# Patient Record
Sex: Female | Born: 1940 | Race: Asian | Hispanic: No | Marital: Married | Smoking: Former smoker
Health system: Southern US, Community
[De-identification: ages and names within clinical notes are randomized; demographics above are authoritative.]

## PROBLEM LIST (undated history)

## (undated) DIAGNOSIS — M1990 Unspecified osteoarthritis, unspecified site: Secondary | ICD-10-CM

## (undated) DIAGNOSIS — G4730 Sleep apnea, unspecified: Secondary | ICD-10-CM

## (undated) DIAGNOSIS — E7800 Pure hypercholesterolemia, unspecified: Secondary | ICD-10-CM

## (undated) DIAGNOSIS — E119 Type 2 diabetes mellitus without complications: Secondary | ICD-10-CM

## (undated) DIAGNOSIS — M8580 Other specified disorders of bone density and structure, unspecified site: Secondary | ICD-10-CM

## (undated) DIAGNOSIS — C50919 Malignant neoplasm of unspecified site of unspecified female breast: Secondary | ICD-10-CM

## (undated) DIAGNOSIS — I10 Essential (primary) hypertension: Secondary | ICD-10-CM

## (undated) DIAGNOSIS — F419 Anxiety disorder, unspecified: Secondary | ICD-10-CM

## (undated) DIAGNOSIS — D0510 Intraductal carcinoma in situ of unspecified breast: Secondary | ICD-10-CM

## (undated) HISTORY — PX: COMBINED LAPAROSCOPY W/ HYSTEROSCOPY: SUR299

## (undated) HISTORY — DX: Malignant neoplasm of unspecified site of unspecified female breast: C50.919

## (undated) HISTORY — DX: Intraductal carcinoma in situ of unspecified breast: D05.10

## (undated) HISTORY — DX: Other specified disorders of bone density and structure, unspecified site: M85.80

## (undated) HISTORY — PX: TUBAL LIGATION: SHX77

## (undated) HISTORY — DX: Essential (primary) hypertension: I10

---

## 1989-03-10 HISTORY — PX: BREAST LUMPECTOMY: SHX2

## 1998-01-25 ENCOUNTER — Ambulatory Visit (HOSPITAL_COMMUNITY): Admission: RE | Admit: 1998-01-25 | Discharge: 1998-01-25 | Payer: Self-pay | Admitting: Internal Medicine

## 1999-06-23 ENCOUNTER — Other Ambulatory Visit: Admission: RE | Admit: 1999-06-23 | Discharge: 1999-06-23 | Payer: Self-pay | Admitting: *Deleted

## 1999-08-23 ENCOUNTER — Encounter: Admission: RE | Admit: 1999-08-23 | Discharge: 1999-08-23 | Payer: Self-pay | Admitting: Oncology

## 1999-08-23 ENCOUNTER — Encounter: Payer: Self-pay | Admitting: Oncology

## 1999-09-15 ENCOUNTER — Other Ambulatory Visit: Admission: RE | Admit: 1999-09-15 | Discharge: 1999-09-15 | Payer: Self-pay | Admitting: *Deleted

## 2000-01-02 ENCOUNTER — Ambulatory Visit (HOSPITAL_COMMUNITY): Admission: RE | Admit: 2000-01-02 | Discharge: 2000-01-02 | Payer: Self-pay | Admitting: *Deleted

## 2000-01-02 ENCOUNTER — Encounter (INDEPENDENT_AMBULATORY_CARE_PROVIDER_SITE_OTHER): Payer: Self-pay

## 2001-09-17 ENCOUNTER — Other Ambulatory Visit: Admission: RE | Admit: 2001-09-17 | Discharge: 2001-09-17 | Payer: Self-pay | Admitting: *Deleted

## 2001-11-18 ENCOUNTER — Ambulatory Visit (HOSPITAL_COMMUNITY): Admission: RE | Admit: 2001-11-18 | Discharge: 2001-11-18 | Payer: Self-pay | Admitting: Gastroenterology

## 2002-09-26 ENCOUNTER — Other Ambulatory Visit: Admission: RE | Admit: 2002-09-26 | Discharge: 2002-09-26 | Payer: Self-pay | Admitting: *Deleted

## 2003-10-06 ENCOUNTER — Other Ambulatory Visit: Admission: RE | Admit: 2003-10-06 | Discharge: 2003-10-06 | Payer: Self-pay | Admitting: *Deleted

## 2004-05-21 ENCOUNTER — Ambulatory Visit: Payer: Self-pay | Admitting: Oncology

## 2004-11-04 ENCOUNTER — Ambulatory Visit: Payer: Self-pay | Admitting: Oncology

## 2005-01-11 ENCOUNTER — Encounter: Admission: RE | Admit: 2005-01-11 | Discharge: 2005-01-11 | Payer: Self-pay | Admitting: Internal Medicine

## 2005-04-11 ENCOUNTER — Encounter: Admission: RE | Admit: 2005-04-11 | Discharge: 2005-04-11 | Payer: Self-pay | Admitting: Gastroenterology

## 2005-06-06 ENCOUNTER — Ambulatory Visit: Payer: Self-pay | Admitting: Oncology

## 2009-10-30 ENCOUNTER — Emergency Department (HOSPITAL_COMMUNITY): Admission: EM | Admit: 2009-10-30 | Discharge: 2009-10-31 | Payer: Self-pay | Admitting: Emergency Medicine

## 2010-09-27 LAB — URINE CULTURE: Colony Count: NO GROWTH

## 2010-09-27 LAB — URINALYSIS, ROUTINE W REFLEX MICROSCOPIC
Bilirubin Urine: NEGATIVE
Glucose, UA: NEGATIVE mg/dL
Hgb urine dipstick: NEGATIVE
Ketones, ur: NEGATIVE mg/dL
Nitrite: NEGATIVE
Protein, ur: NEGATIVE mg/dL
Specific Gravity, Urine: 1.007 (ref 1.005–1.030)
Urobilinogen, UA: 0.2 mg/dL (ref 0.0–1.0)
pH: 6.5 (ref 5.0–8.0)

## 2010-09-27 LAB — CBC
Hemoglobin: 13.3 g/dL (ref 12.0–15.0)
MCHC: 33.8 g/dL (ref 30.0–36.0)
RBC: 4.44 MIL/uL (ref 3.87–5.11)
WBC: 6.2 10*3/uL (ref 4.0–10.5)

## 2010-09-27 LAB — BASIC METABOLIC PANEL
CO2: 29 mEq/L (ref 19–32)
Calcium: 9.1 mg/dL (ref 8.4–10.5)
Creatinine, Ser: 0.69 mg/dL (ref 0.4–1.2)
GFR calc Af Amer: >60 mL/min (ref >60)
GFR calc non Af Amer: >60 mL/min (ref >60)
Sodium: 138 mEq/L (ref 135–145)

## 2010-09-27 LAB — POCT CARDIAC MARKERS
CKMB, poc: 1.2 ng/mL (ref 1.0–8.0)
Myoglobin, poc: 51.8 ng/mL (ref 12–200)
Troponin i, poc: <0.05 ng/mL (ref 0.00–0.09)

## 2010-09-27 LAB — DIFFERENTIAL
Lymphocytes Relative: 36 % (ref 12–46)
Lymphs Abs: 2.2 10*3/uL (ref 0.7–4.0)
Monocytes Absolute: 0.6 10*3/uL (ref 0.1–1.0)
Monocytes Relative: 9 % (ref 3–12)
Neutro Abs: 3.1 10*3/uL (ref 1.7–7.7)
Neutrophils Relative %: 50 % (ref 43–77)

## 2010-09-27 LAB — URINE MICROSCOPIC-ADD ON

## 2010-11-25 NOTE — Procedures (Signed)
[facility]  Patient:    Karen [name], Karen [name] Visit Number: [id number] MRN: [id number]          Service Type: END Location: ENDO Attending Physician:  Orland Mustard Dictated by:   Llana Aliment. Randa Evens, M.D. Proc. Date: 11/18/01 Admit Date:  11/18/2001   CC:         Raymond Gurney C. Magrinat, M.D.  Ivery Quale, M.D.  Pershing Cox, M.D.   Procedure Report  DATE OF BIRTH:  10-11-1940  PROCEDURE:  Colonoscopy.  MEDICATIONS:  Fentanyl 50 mcg and Versed 5 mg IV.  INDICATIONS:  Colon cancer screening.  DESCRIPTION OF PROCEDURE:  The procedure had been explained to the patient and consent obtained.  With the patient in the left lateral decubitus position, the Olympus pediatric scope was inserted and advanced under direct visualization.  The prep was excellent.  We were able to reach the cecum using abdominal pressure and position changes.  The ileocecal valve and appendiceal orifice were seen.  The scope was withdrawn. The cecum, ascending colon, hepatic flexure, transverse colon, splenic flexure, descending, and sigmoid colon were seen well.  No polyps were seen.  The scope was withdrawn.  The patient tolerated the procedure well.  ASSESSMENT:  Essentially normal screening colonoscopy.  PLAN:  Routine followup with yearly hemoccults.  Consider another screen in 5-10 years, depending on the recommendations at that time. Dictated by:   Llana Aliment. Randa Evens, M.D. Attending Physician:  Orland Mustard DD:  11/18/01 TD:  11/19/01 Job: [id number]

## 2010-11-25 NOTE — Op Note (Signed)
[facility]  Patient:    Karen [name], Karen [name]                         MRN: [id number] Proc. Date: 01/02/00 Attending:  Pershing Cox, M.D. CC:         Pershing Cox, M.D.             Valentino Hue. Magrinat, M.D.                           Operative Report  PREOPERATIVE DIAGNOSES:  Vaginal bleeding on tamoxifen, history of breast cancer and abnormal transvaginal sonogram.  POSTOPERATIVE DIAGNOSES:  Atrophic endometrial canal.  No evidence of neoplasm.  SURGEON:  Pershing Cox, M.D.  ANESTHESIA:  MAC plus Marcaine paracervical block.  INDICATION FOR PROCEDURE:  The patient is a 70 year old female who has a history of breast cancer.  She has been on tamoxifen therapy and recently developed several episodes of dark vaginal spotting.  Recent hysterosonogram in my office showed a posterior small echogenic area along the fundal wall. Because of her history and concerns for the development of polyp versus endometrial cancer, the patient was brought to the operating room today for hysteroscopy/D&C.  OPERATIVE FINDINGS:  The uterus is small and there are no adnexal masses.  The sound passes easily to 7 cm.  The cavity was very atrophic.  There were wispy fronds of tissue arising from the fundus, which are probably the reason for her abnormal hysterosonogram.  DESCRIPTION OF PROCEDURE:  Karen [name] was brought to the operating room with an IV in place.  In the holding area, she had received a gram of Ancef. Supine on the OR table, MAC analgesia was administered.  She was then placed into Allen stirrups and exam under anesthesia was performed.  Betadine was used to prep the lower abdomen, upper thighs and vagina.  A red rubber catheter was used to drain the bladder.  Sterile linens were used to drape the patient for a vaginal procedure and a collection drape was placed beneath her hips for the affluent.  Bivalved speculum was inserted into the vagina  and Marcaine 0.25% was injected into the anterior cervix, which was then grasped with a single-tooth tenaculum.  The speculum was removed and a weighted vaginal speculum was used for the remainder of the case.  Marcaine 0.25% was used to develop a paracervical block by injecting 18 cc of 0.25% Marcaine at the 3, 4, 7 and 8 position.  Endocervical curettings were obtained with a Kevorkian curette.  The sound then passed easily to a depth of 7 cm.  Serial Pratt dilators were used to a size 23 to dilate the cervix and a diagnostic scope was introduced.  Pictures of the cavity were taken.  The scope was removed.  The cervix was further dilated to size 33 and a small sharp curette was then used to curette the uterine walls.  Very scant tissue was obtained. A serrated curette was then also used to serially curette the walls.  All of these were collected for endometrial curettings and the patient was taken to the recovery room for recovery.DD:  01/02/00 TD:  01/04/00 Job: [id number]

## 2013-05-05 ENCOUNTER — Encounter: Payer: Self-pay | Admitting: Neurology

## 2013-05-05 ENCOUNTER — Ambulatory Visit (INDEPENDENT_AMBULATORY_CARE_PROVIDER_SITE_OTHER): Payer: Medicare Other | Admitting: Neurology

## 2013-05-05 VITALS — BP 125/70 | HR 60 | Temp 97.2°F | Ht <= 58 in | Wt 124.0 lb

## 2013-05-05 DIAGNOSIS — G479 Sleep disorder, unspecified: Secondary | ICD-10-CM

## 2013-05-05 DIAGNOSIS — G4733 Obstructive sleep apnea (adult) (pediatric): Secondary | ICD-10-CM

## 2013-05-05 NOTE — Progress Notes (Signed)
Subjective:    Patient ID: Karen [name] is a 72 y.o. female.  HPI  Karen Foley, MD, PhD [facility] [address], [address] [address] [place], [place] [address]  Dear Dr. Eloise [name],   I saw your patient, Karen [name], upon your kind request in my neurologic clinic today for initial consultation of her sleep disorder, in particular, concern for obstructive sleep apnea. The patient is unaccompanied today. As you know, Karen [name] is a very pleasant 72 year old right-handed woman with an underlying medical history of breast cancer on the right, status post lumpectomy, chemotherapy and radiation therapy, hypertension, osteopenia, hypertension, and eczema, who has been complaining of nonrestorative sleep, and sleep disruption as well as daytime somnolence. She is known to snore heavily, per grand daughter. She has trouble maintaining sleep and has early morning arising with rumination of thought. Her husband snores and she has tried sleeping in a different bedroom.   Her typical bedtime is reported to be around 9 PM and usual wake time is around 6 AM. Sleep onset typically occurs within 30-60 minutes. She reports feeling marginally rested upon awakening. She wakes up on an average 1 times in the middle of the night and has to go to the bathroom rarely in a typical night. She reports no morning headaches.  She reports occasional excessive daytime somnolence (EDS) and Her Epworth Sleepiness Score (ESS) is 6/24 today. She has not fallen asleep while driving. The patient has been taking a scheduled nap, which is usually after lunch and 1 hour to 1.5 hours long. She denies dreaming in a nap and reports feeling refreshed after a nap. She has been known to snore for the past many years. Snoring is reportedly moderate, but not associated with choking sounds and witnessed apneas. The patient admits to a rare sense of choking or strangling feeling. There is no report of nighttime reflux, with  no nighttime cough experienced. The patient has not noted any RLS symptoms and is not known to kick while asleep or before falling asleep. There is no family history of RLS or OSA.  She is a restless sleeper and in the morning, the bed is quite disheveled.   She denies cataplexy, sleep paralysis, hypnagogic or hypnopompic hallucinations, or sleep attacks. She does not report any vivid dreams, nightmares, dream enactments, or parasomnias, such as sleep talking or sleep walking. The patient has not had a sleep study or a home sleep test.  She consumes 0 caffeinated beverages per day, usually in the form of decaff coffee in the morning.   Her bedroom is usually dark and cool. There is a TV in the bedroom and usually it is on at night.   Her Past Medical History Is Significant For: Past Medical History  Diagnosis Date  . HTN (hypertension)   . Osteopenia     Her Past Surgical History Is Significant For: Past Surgical History  Procedure Laterality Date  . Breast lumpectomy Right   . Combined laparoscopy w/ hysteroscopy      Her Family History Is Significant For: Family History  Problem Relation Age of Onset  . Hypertension Father   . Heart failure Mother   . Diabetes Mother   . Cancer Paternal Aunt     Her Social History Is Significant For: History   Social History  . Marital Status: Single    Spouse Name: N/A    Number of Children: N/A  . Years of Education: N/A   Occupational History  .  retired Arts administrator    Professor   Social History Main Topics  . Smoking status: Former Smoker    Types: Cigarettes    Quit date: 05/06/1991  . Smokeless tobacco: None  . Alcohol Use: No  . Drug Use: No  . Sexual Activity: None   Other Topics Concern  . None   Social History Narrative  . None    Her Allergies Are:  No Known Allergies:   Her Current Medications Are:  Outpatient Encounter Prescriptions as of 05/05/2013  Medication Sig Dispense Refill  . ALPRAZolam (XANAX) 0.5 MG  tablet Take 1 tablet by mouth as needed.      Marland Kitchen EXFORGE 5-160 MG per tablet Take 1 tablet by mouth daily.      . metoprolol tartrate (LOPRESSOR) 25 MG tablet Take 1 tablet by mouth 2 (two) times daily.      . simvastatin (ZOCOR) 40 MG tablet Take 0.5 tablets by mouth at bedtime.      . predniSONE (DELTASONE) 20 MG tablet        No facility-administered encounter medications on file as of 05/05/2013.  :  Review of Systems:  Out of a complete 14 point review of systems, all are reviewed and negative with the exception of these symptoms as listed below:  Review of Systems  Constitutional: Negative.   HENT: Negative.   Eyes: Negative.   Respiratory: Negative.   Cardiovascular: Negative.   Gastrointestinal: Negative.   Endocrine: Negative.   Genitourinary: Negative.   Musculoskeletal: Negative.   Skin: Negative.   Allergic/Immunologic: Negative.   Neurological: Negative.   Hematological: Negative.   Psychiatric/Behavioral: Positive for sleep disturbance.    Objective:  Neurologic Exam  Physical Exam Physical Examination:   Filed Vitals:   05/05/13 0832  BP: 125/70  Pulse: 60  Temp: 97.2 F (36.2 C)    General Examination: The patient is a very pleasant 72 y.o. female in no acute distress. She appears well-developed and well-nourished and well groomed. She is not obese.   HEENT: Normocephalic, atraumatic, pupils are equal, round and reactive to light and accommodation. She has cataracts. Funduscopic exam is normal with sharp disc margins noted. Extraocular tracking is good without limitation to gaze excursion or nystagmus noted. Normal smooth pursuit is noted. Hearing is grossly intact. Tympanic membranes are clear bilaterally. Face is symmetric with normal facial animation and normal facial sensation. Speech is clear with no dysarthria noted. There is no hypophonia. There is no lip, neck/head, jaw or voice tremor. Neck is supple with full range of passive and active motion.  There are no carotid bruits on auscultation. Oropharynx exam reveals: mild mouth dryness, good dental hygiene and mild airway crowding, due to narrow airway and floppy soft palate. Mallampati is class II. Tongue protrudes centrally and palate elevates symmetrically. Tonsils are 1+ in size/absent. Neck size is 13.75 inches.   Chest: Clear to auscultation without wheezing, rhonchi or crackles noted.  Heart: S1+S2+0, regular and normal without murmurs, rubs or gallops noted.   Abdomen: Soft, non-tender and non-distended with normal bowel sounds appreciated on auscultation.  Extremities: There is no pitting edema in the distal lower extremities bilaterally. Pedal pulses are intact.  Skin: Warm and dry without trophic changes noted. There are no varicose veins.  Musculoskeletal: exam reveals no obvious joint deformities, tenderness or joint swelling or erythema.   Neurologically:  Mental status: The patient is awake, alert and oriented in all 4 spheres. Her memory, attention, language and knowledge are appropriate. There is  no aphasia, agnosia, apraxia or anomia. Speech is clear with normal prosody and enunciation. Thought process is linear. Mood is congruent and affect is normal.  Cranial nerves are as described above under HEENT exam. In addition, shoulder shrug is normal with equal shoulder height noted. Motor exam: Normal bulk, strength and tone is noted. There is no drift, tremor or rebound. Romberg is negative. Reflexes are 2+ throughout. Toes are downgoing bilaterally. Fine motor skills are intact with normal finger taps, normal hand movements, normal rapid alternating patting, normal foot taps and normal foot agility.  Cerebellar testing shows no dysmetria or intention tremor on finger to nose testing. Heel to shin is unremarkable bilaterally. There is no truncal or gait ataxia.  Sensory exam is intact to light touch, pinprick, vibration, temperature sense and proprioception in the upper and  lower extremities.  Gait, station and balance are unremarkable. No veering to one side is noted. No leaning to one side is noted. Posture is age-appropriate and stance is narrow based. No problems turning are noted. She turns en bloc. Tandem walk is unremarkable. Intact toe and heel stance is noted.               Assessment and Plan:   In summary, NATALEY BAHRI is a very pleasant 72 y.o.-year old female with a history and physical exam concerning for obstructive sleep apnea (OSA). I had a long chat with the patient about my findings and the diagnosis, its prognosis and treatment options. We talked about medical treatments and non-pharmacological approaches. I explained in particular the risks and ramifications of untreated moderate to severe OSA, especially with respect to developing cardiovascular disease down the Road, including congestive heart failure, difficult to treat hypertension, cardiac arrhythmias, or stroke. Even type 2 diabetes has in part been linked to untreated OSA. We talked about trying to maintain a healthy lifestyle in general, as well as the importance of weight control. I encouraged the patient to eat healthy, exercise daily and keep well hydrated, to keep a scheduled bedtime and wake time routine, to not skip any meals and eat healthy snacks in between meals.  I recommended the following at this time: sleep study with potential positive airway pressure titration. She will call us back to schedule her sleep study. She is quite apprehensive about the test. I tried to reassure her. However, given her [place] descent I do worry that she may have significant obstructive sleep apnea even in the absence of frank obesity. 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 She is going to have to talk to her son this and ask one of them to stay overnight at her house so she can have a sleep study done. She is advised that we can also  accommodate her schedule and start the study a little earlier or later depending on when her son can come and stay. We can also and the study a little earlier if she needs to relieve her son in the morning. She had many questions about the sleep test procedure, the treatment options, the study itself and whether there were any side effects. I tried to answer all her questions as best as I can. I explained the sleep test procedure to the patient and also outlined possible surgical and non-surgical treatment options of OSA, including the use of a custom-made dental device, upper airway surgical options, such as pillar implants, radiofrequency surgery, tongue base surgery,  and UPPP. I also explained the CPAP treatment option to the patient, who indicated that she would be very reluctant but willing to try CPAP if the need arises. I explained the importance of being compliant with PAP treatment, not only for insurance purposes but primarily to improve Her symptoms, and for the patient's long term health benefit, including to reduce Her cardiovascular risks. I answered all her questions today and the patient was in agreement. I would like to see her back after the sleep study is completed and encouraged her to call with any interim questions, concerns, problems or updates.   Most of my 60 minute visit today was spent in counseling and coordination of care, reviewing test and treatment options.  Thank you very much for allowing me to participate in the care of this nice patient. If I can be of any further assistance to you please do not hesitate to call me at [phone].  Sincerely,   Karen Foley, MD, PhD

## 2013-05-05 NOTE — Patient Instructions (Signed)
Based on your symptoms and your exam I believe you are at risk for obstructive sleep apnea or OSA, and I think we should proceed with a sleep study to determine whether you do or do not have OSA and how severe it is. If you have more than mild OSA, I want you to consider treatment with CPAP. Please remember, the risks and ramifications of moderate to severe obstructive sleep apnea or OSA are: Cardiovascular disease, including congestive heart failure, stroke, difficult to control hypertension, arrhythmias, and even type 2 diabetes has been linked to untreated OSA. Sleep apnea causes disruption of sleep and sleep deprivation in most cases, which, in turn, can cause recurrent headaches, problems with memory, mood, concentration, focus, and vigilance. Most people with untreated sleep apnea report excessive daytime sleepiness, which can affect their ability to drive. Please do not drive if you feel sleepy.  I will see you back after your sleep study to go over the test results and where to go from there. We will call you after your sleep study and to set up an appointment at the time.   Please remember to try to maintain good sleep hygiene, which means: Keep a regular sleep and wake schedule, try not to exercise or have a meal within 2 hours of your bedtime, try to keep your bedroom conducive for sleep, that is, cool and dark, without light distractors such as an illuminated alarm clock, and refrain from watching TV right before sleep or in the middle of the night and do not keep the TV or radio on during the night. Also, try not to use or play on electronic devices at bedtime, such as your cell phone, tablet PC or laptop. If you like to read at bedtime on an electronic device, try to dim the background light as much as possible.  

## 2013-11-11 ENCOUNTER — Other Ambulatory Visit: Payer: Self-pay | Admitting: Internal Medicine

## 2013-11-11 DIAGNOSIS — R922 Inconclusive mammogram: Secondary | ICD-10-CM

## 2013-11-14 ENCOUNTER — Ambulatory Visit
Admission: RE | Admit: 2013-11-14 | Discharge: 2013-11-14 | Disposition: A | Discharge status: Home / Self Care | Payer: 59 | Source: Ambulatory Visit | Attending: Internal Medicine | Admitting: Internal Medicine

## 2013-11-14 DIAGNOSIS — R922 Inconclusive mammogram: Secondary | ICD-10-CM

## 2013-11-14 DIAGNOSIS — R9230 Dense breasts, unspecified: Secondary | ICD-10-CM

## 2013-11-14 MED ORDER — GADOBENATE DIMEGLUMINE 529 MG/ML IV SOLN
11.0000 mL | Freq: Once | INTRAVENOUS | Status: AC | PRN
  Administered 2013-11-14: 11 mL via INTRAVENOUS

## 2013-11-18 ENCOUNTER — Other Ambulatory Visit: Payer: Self-pay | Admitting: Internal Medicine

## 2013-11-18 DIAGNOSIS — R928 Other abnormal and inconclusive findings on diagnostic imaging of breast: Secondary | ICD-10-CM

## 2013-11-21 ENCOUNTER — Ambulatory Visit
Admission: RE | Admit: 2013-11-21 | Discharge: 2013-11-21 | Disposition: A | Discharge status: Home / Self Care | Payer: 59 | Source: Ambulatory Visit | Attending: Internal Medicine | Admitting: Internal Medicine

## 2013-11-21 ENCOUNTER — Encounter (INDEPENDENT_AMBULATORY_CARE_PROVIDER_SITE_OTHER): Payer: Self-pay

## 2013-11-21 DIAGNOSIS — R928 Other abnormal and inconclusive findings on diagnostic imaging of breast: Secondary | ICD-10-CM

## 2013-11-21 MED ORDER — GADOBENATE DIMEGLUMINE 529 MG/ML IV SOLN
11.0000 mL | Freq: Once | INTRAVENOUS | Status: AC | PRN
  Administered 2013-11-21: 11 mL via INTRAVENOUS

## 2013-11-27 ENCOUNTER — Ambulatory Visit (INDEPENDENT_AMBULATORY_CARE_PROVIDER_SITE_OTHER): Payer: Medicare Other | Admitting: General Surgery

## 2013-11-27 ENCOUNTER — Encounter (INDEPENDENT_AMBULATORY_CARE_PROVIDER_SITE_OTHER): Payer: Self-pay | Admitting: General Surgery

## 2013-11-27 VITALS — BP 128/80 | HR 58 | Temp 97.3°F | Ht <= 58 in | Wt 121.4 lb

## 2013-11-27 DIAGNOSIS — C50219 Malignant neoplasm of upper-inner quadrant of unspecified female breast: Secondary | ICD-10-CM

## 2013-11-27 DIAGNOSIS — C50211 Malignant neoplasm of upper-inner quadrant of right female breast: Secondary | ICD-10-CM | POA: Insufficient documentation

## 2013-11-27 NOTE — Progress Notes (Addendum)
Patient ID: Karen [name], female   DOB: [birth year], 73 y.o.   MRN: [id number]  Chief Complaint  Patient presents with  . eval right breast ca    HPI Karen [name] is a 73 y.o. female.  She is referred back to [place] by Dr. Johnnette Gourd and Dr. Leanna Battles for evaluation of a second cancer in the right breast.  In 1995, Dr. Harlow Asa  performed a right partial mastectomy, axillary lymph node dissection for invasive cancer, upper outer quadrant. She states she had radiation therapy and adjuvant chemotherapy through peripheral vein, and 5 years of tamoxifen. She was followed by Dr. Jana Hakim for a while but ultimately graduated from his care. She has not been seen in this practice for many years but remains fairly healthy.  Mammograms were performed at [facility] on 09/26/2013, breasts were noted to be dense but no focal abnormality was noted category 2. A screening MRI was performed which showed a 2.5 x 2.0 cm area of enhancement in the right breast, upper inner quadrant.   The left breast looked normal. 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  She is here today with her son.  She has very few comorbidities. Hyperlipidemia and hypertension. Basically is healthy.  Family history reveals breast cancer and 2 paternal aunts and one paternal first cousin.  HPI  Past Medical History  Diagnosis Date  . HTN (hypertension)   . Osteopenia     Past Surgical History  Procedure Laterality Date  . Breast lumpectomy Right   . Combined laparoscopy w/ hysteroscopy      Family History  Problem Relation Age of Onset  . Hypertension Father   . Heart failure Mother   . Diabetes Mother   . Cancer Paternal Aunt     Social History History  Substance Use Topics  . Smoking status: Former Smoker    Types: Cigarettes    Quit date: 05/06/1991  . Smokeless tobacco: Not on file  . Alcohol Use: No    No Known Allergies  Current Outpatient Prescriptions  Medication Sig Dispense Refill    . ALPRAZolam (XANAX) 0.5 MG tablet Take 1 tablet by mouth as needed.      Marland Kitchen EXFORGE 5-160 MG per tablet Take 1 tablet by mouth daily.      . metoprolol tartrate (LOPRESSOR) 25 MG tablet Take 1 tablet by mouth 2 (two) times daily.      . simvastatin (ZOCOR) 40 MG tablet Take 0.5 tablets by mouth at bedtime.       No current facility-administered medications for this visit.    Review of Systems Review of Systems  Constitutional: Negative for fever, chills and unexpected weight change.  HENT: Negative for congestion, hearing loss, sore throat, trouble swallowing and voice change.   Eyes: Negative for visual disturbance.  Respiratory: Negative for cough and wheezing.   Cardiovascular: Negative for chest pain, palpitations and leg swelling.  Gastrointestinal: Negative for nausea, vomiting, abdominal pain, diarrhea, constipation, blood in stool, abdominal distention and anal bleeding.  Genitourinary: Negative for hematuria, vaginal bleeding and difficulty urinating.  Musculoskeletal: Positive for arthralgias, myalgias and neck pain.  Skin: Negative for rash and wound.  Neurological: Negative for seizures, syncope and headaches.  Hematological: Negative for adenopathy. Does not bruise/bleed easily.  Psychiatric/Behavioral: Negative for confusion.    Blood pressure 128/80, pulse 58, temperature 97.3 F (36.3 C), height 4\' 9"  (1.448 m), weight 121 lb 6.4 oz (55.067 kg).  Physical Exam Physical Exam  Constitutional: She is oriented  to person, place, and time. She appears well-developed and well-nourished. No distress.  HENT:  Head: Normocephalic and atraumatic.  Nose: Nose normal.  Mouth/Throat: No oropharyngeal exudate.  Eyes: Conjunctivae and EOM are normal. Pupils are equal, round, and reactive to light. Left eye exhibits no discharge. No scleral icterus.  Neck: Neck supple. No JVD present. No tracheal deviation present. No thyromegaly present.  Cardiovascular: Normal rate, regular  rhythm, normal heart sounds and intact distal pulses.   No murmur heard. Pulmonary/Chest: Effort normal and breath sounds normal. No respiratory distress. She has no wheezes. She has no rales. She exhibits no tenderness.    Abdominal: Soft. Bowel sounds are normal. She exhibits no distension and no mass. There is no tenderness. There is no rebound and no guarding.  Musculoskeletal: She exhibits no edema and no tenderness.  Lymphadenopathy:    She has no cervical adenopathy.  Neurological: She is alert and oriented to person, place, and time. She exhibits normal muscle tone. Coordination normal.  Skin: Skin is warm. No rash noted. She is not diaphoretic. No erythema. No pallor.  Psychiatric: She has a normal mood and affect. Her behavior is normal. Judgment and thought content normal.    Data Reviewed Imaging studies and pathology report  Assessment    Low grade DCIS right breast, upper inner quadrant. This is most likely a second primary given the time interval and different quadrant  History right partial mastectomy, axillary lymph node dissection, radiation therapy chemotherapy and antiestrogen therapy 1995. Details of her cancer are not known.   Hypertension  Hyperlipidemia,   excellent performance status    Plan    Title long talk with the patient and her son. I told her that the standard of care in her situation, with a history of invasive cancer and radiation therapy, was right total mastectomy. I told her we did not have any data on lumpectomy in this setting and I was not is comfortable with this plan. I told her that she could consider immediate or delayed reconstruction. We talked long time about these issues.  Offered to refer her to Dr. Jana Hakim for preoperative second opinion. Ultimately she stated she would see him postop but declined preop consultation. Addendum: 12/10/2013. Patient has seen Dr. Jana Hakim and understands that mastectomy is the only good choice for  her. She told Dr. Jana Hakim that she wanted genetic esting and that has been arranged. She told him that if she was found to have a deleterious mutation she wants to have bilateral mastectomies. That is a reasonable approach and we will discuss this with her at her next office visit.She is scheduled to see me on June 29.  I offered to refer her back to Dr. Armandina Gemma to manage this surgically, and she asked that I simply follow through all the treatment plan       I Offered to refer her to a plastic surgeon for consultation regarding immediate or delayed reconstruction. She was somewhat undecided and  ambivalent about this and will to go home and think about it. We talked about these issues at length. Addendum. 12/10/2013. She saw Dr. Crissie Reese than they discussed reconstructive options. No decisions were made. She stated that she wanted to think things over it and decide after considering all of her options and her genetic testing.She is scheduled to see me on June 29.        ADDENDUM:(12/25/2015):  Genetic testing is reportedly negative       She's going to  go home and think about this over the weekend call me back early next week to underwater decisions.  Burnis Medin proceed with treatment planning at that time.        Edsel Petrin. Dalbert Batman, M.D., [facility] General and Minimally invasive Surgery Breast and Colorectal Surgery Office:   [phone] Pager:   [phone]  11/27/2013, 1:20 PM

## 2013-11-27 NOTE — Patient Instructions (Signed)
You have developed a new, second breast cancer in your right breast. Fortunately this is an early, in situ breast cancer, but the cancer may be as large as 2.5 cm in diameter.  Because of your prior surgery and radiation therapy, the standard of care in this situation is a right mastectomy.  We have talked about options for surgery including possible immediate or delayed reconstruction. We have talked about obtaining a second opinion with another surgeon or medical oncologist.  At this point in time you plan to go home and think about this over the weekend and call me back to let me know whether you want to see a plastic surgeon or not. Eventually, we will schedule you for a right total mastectomy, with or without reconstruction.     Mastectomy, With or Without Reconstruction Mastectomy (removal of the breast) is a procedure most commonly used to treat cancer (tumor) of the breast. Different procedures are available for treatment. This depends on the stage of the tumor (abnormal growths). Discuss this with your caregiver, surgeon (a specialist for performing operations such as this), or oncologist (someone specialized in the treatment of cancer). With proper information, you can decide which treatment is best for you. Although the sound of the word cancer is frightening to all of [place], the new treatments and medications can be a source of reassurance and comfort. If there are things you are worried about, discuss them with your caregiver. He or she can help comfort you and your family. Some of the different procedures for treating breast cancer are:  Radical (extensive) mastectomy. This is an operation used to remove the entire breast, the muscles under the breast, and all of the glands (lymph nodes) under the arm. With all of the new treatments available for cancer of the breast, this procedure has become less common.  Modified radical mastectomy. This is a similar operation to the radical mastectomy  described above. In the modified radical mastectomy, the muscles of the chest wall are not removed unless one of the lessor muscles is removed. One of the lessor muscles may be removed to allow better removal of the lymph nodes. The axillary lymph nodes are also removed. Rarely, during an axillary node dissection nerves to this area are damaged. Radiation therapy is then often used to the area following this surgery.  A total mastectomy also known as a complete or simple mastectomy. It involves removal of only the breast. The lymph nodes and the muscles are left in place.  In a lumpectomy, the lump is removed from the breast. This is the simplest form of surgical treatment. A sentinel lymph node biopsy may also be done. Additional treatment may be required. RISKS AND COMPLICATIONS The main problems that follow removal of the breast include:  Infection (germs start growing in the wound). This can usually be treated with antibiotics (medications that kill germs).  Lymphedema. This means the arm on the side of the breast that was operated on swells because the lymph (tissue fluid) cannot follow the main channels back into the body. This only occurs when the lymph nodes have had to be removed under the arm.  There may be some areas of numbness to the upper arm and around the incision (cut by the surgeon) in the breast. This happens because of the cutting of or damage to some of the nerves in the area. This is most often unavoidable.  There may be difficulty moving the arm in a full range of motion (moving  in all directions) following surgery. This usually improves with time following use and exercise.  Recurrence of breast cancer may happen with the very best of surgery and follow up treatment. Sometimes small cancer cells that cannot be seen with the naked eye have already spread at the time of surgery. When this happens other treatment is available. This treatment may be radiation, medications or a  combination of both. RECONSTRUCTION Reconstruction of the breast may be done immediately if there is not going to be post-operative radiation. This surgery is done for cosmetic (improve appearance) purposes to improve the physical appearance after the operation. This may be done in two ways:  It can be done using a saline filled prosthetic (an artificial breast which is filled with salt water). Silicone breast implants are now re-approved by the FDA and are being commonly used.  Reconstruction can be done using the body's own muscle/fat/skin. Your caregiver will discuss your options with you. Depending upon your needs or choice, together you will be able to determine which procedure is best for you. Document Released: 03/21/2001 Document Revised: 03/20/2012 Document Reviewed: 11/12/2007 [facility] Patient Information 2014 [place].

## 2013-12-02 ENCOUNTER — Telehealth (INDEPENDENT_AMBULATORY_CARE_PROVIDER_SITE_OTHER): Payer: Self-pay | Admitting: General Surgery

## 2013-12-02 ENCOUNTER — Other Ambulatory Visit (INDEPENDENT_AMBULATORY_CARE_PROVIDER_SITE_OTHER): Payer: Self-pay

## 2013-12-02 ENCOUNTER — Encounter (INDEPENDENT_AMBULATORY_CARE_PROVIDER_SITE_OTHER): Payer: Self-pay

## 2013-12-02 DIAGNOSIS — D0590 Unspecified type of carcinoma in situ of unspecified breast: Secondary | ICD-10-CM

## 2013-12-02 NOTE — Telephone Encounter (Signed)
Ms. Nawabi call me this morning for further discussion regarding her recurrent breast cancer. She had lots of questions that we answered. Basically she wants to consider her reconstructive options , and so she will be referred to plastic surgery. She also wanted to see Dr. Jana Hakim for a preoperative medical oncology consultation. He was her oncologist in the past. She will be referred to him.  She will return to see me in 2-3 weeks for final treatment planning.   Edsel Petrin. Dalbert Batman, M.D., [facility] General and Minimally invasive Surgery Breast and Colorectal Surgery Office:   [phone] Pager:   [phone]

## 2013-12-04 ENCOUNTER — Other Ambulatory Visit: Payer: Self-pay | Admitting: *Deleted

## 2013-12-04 ENCOUNTER — Telehealth: Payer: Self-pay | Admitting: *Deleted

## 2013-12-04 DIAGNOSIS — C50211 Malignant neoplasm of upper-inner quadrant of right female breast: Secondary | ICD-10-CM

## 2013-12-04 DIAGNOSIS — C50219 Malignant neoplasm of upper-inner quadrant of unspecified female breast: Secondary | ICD-10-CM

## 2013-12-04 NOTE — Telephone Encounter (Signed)
Called pt and informed her of how we need to schedule her for her to see Dr. Jana Hakim and she was fine with that.  Confirmed 12/05/13 [facility] & lab appt w/ pt & 12/06/13 [facility] appt w/ pt.  Unable to mail before appt letter & intake form to pt - placed a note in [employer] for them to give pt the envelope.  Took envelope to [facility] and made them aware. Emailed Glenda at [place] to make her aware.

## 2013-12-05 ENCOUNTER — Ambulatory Visit: Payer: 59

## 2013-12-05 ENCOUNTER — Encounter: Payer: Self-pay | Admitting: Oncology

## 2013-12-05 ENCOUNTER — Other Ambulatory Visit (HOSPITAL_BASED_OUTPATIENT_CLINIC_OR_DEPARTMENT_OTHER): Payer: 59

## 2013-12-05 DIAGNOSIS — D0590 Unspecified type of carcinoma in situ of unspecified breast: Secondary | ICD-10-CM

## 2013-12-05 DIAGNOSIS — C50211 Malignant neoplasm of upper-inner quadrant of right female breast: Secondary | ICD-10-CM

## 2013-12-05 LAB — COMPREHENSIVE METABOLIC PANEL (CC13)
ALT: 34 U/L (ref 0–55)
ANION GAP: 12 meq/L — AB (ref 3–11)
AST: 33 U/L (ref 5–34)
Albumin: 3.8 g/dL (ref 3.5–5.0)
Alkaline Phosphatase: 45 U/L (ref 40–150)
BILIRUBIN TOTAL: 0.45 mg/dL (ref 0.20–1.20)
BUN: 12.3 mg/dL (ref 7.0–26.0)
CO2: 24 meq/L (ref 22–29)
CREATININE: 0.9 mg/dL (ref 0.6–1.1)
Calcium: 9.2 mg/dL (ref 8.4–10.4)
Chloride: 104 mEq/L (ref 98–109)
Glucose: 197 mg/dl — ABNORMAL HIGH (ref 70–140)
Potassium: 4.4 mEq/L (ref 3.5–5.1)
Sodium: 140 mEq/L (ref 136–145)
Total Protein: 7.4 g/dL (ref 6.4–8.3)

## 2013-12-05 LAB — CBC WITH DIFFERENTIAL/PLATELET
BASO%: 0.9 % (ref 0.0–2.0)
Basophils Absolute: 0 10*3/uL (ref 0.0–0.1)
EOS%: 3.3 % (ref 0.0–7.0)
Eosinophils Absolute: 0.1 10*3/uL (ref 0.0–0.5)
HEMATOCRIT: 39.4 % (ref 34.8–46.6)
HGB: 13 g/dL (ref 11.6–15.9)
LYMPH%: 32.3 % (ref 14.0–49.7)
MCH: 29.4 pg (ref 25.1–34.0)
MCHC: 33.1 g/dL (ref 31.5–36.0)
MCV: 88.9 fL (ref 79.5–101.0)
MONO#: 0.4 10*3/uL (ref 0.1–0.9)
MONO%: 9.4 % (ref 0.0–14.0)
NEUT#: 2.2 10*3/uL (ref 1.5–6.5)
NEUT%: 54.1 % (ref 38.4–76.8)
PLATELETS: 181 10*3/uL (ref 145–400)
RBC: 4.43 10*6/uL (ref 3.70–5.45)
RDW: 12.8 % (ref 11.2–14.5)
WBC: 4.2 10*3/uL (ref 3.9–10.3)
lymph#: 1.3 10*3/uL (ref 0.9–3.3)

## 2013-12-05 NOTE — Progress Notes (Signed)
Checked in new pt with no financial concerns. °

## 2013-12-06 ENCOUNTER — Ambulatory Visit (HOSPITAL_BASED_OUTPATIENT_CLINIC_OR_DEPARTMENT_OTHER): Payer: 59 | Admitting: Oncology

## 2013-12-06 ENCOUNTER — Other Ambulatory Visit: Payer: Self-pay | Admitting: *Deleted

## 2013-12-06 VITALS — BP 139/60 | HR 58 | Temp 97.7°F | Resp 18 | Ht <= 58 in | Wt 121.1 lb

## 2013-12-06 DIAGNOSIS — Z803 Family history of malignant neoplasm of breast: Secondary | ICD-10-CM

## 2013-12-06 DIAGNOSIS — Z853 Personal history of malignant neoplasm of breast: Secondary | ICD-10-CM

## 2013-12-06 DIAGNOSIS — Z170 Estrogen receptor positive status [ER+]: Secondary | ICD-10-CM

## 2013-12-06 DIAGNOSIS — D0590 Unspecified type of carcinoma in situ of unspecified breast: Secondary | ICD-10-CM

## 2013-12-06 DIAGNOSIS — C50211 Malignant neoplasm of upper-inner quadrant of right female breast: Secondary | ICD-10-CM

## 2013-12-06 NOTE — Progress Notes (Signed)
[place]  Telephone:([phone]) [phone] Fax:(336) [phone]     ID: Karen [name] OB: Nov 23, 1940  MR#: [id number]  OVF#:[id number]  PCP: Donnajean Lopes, MD GYN:  Vanessa Kick SU: Fanny Skates OTHER MD: Crissie Reese, Laurence Spates  CHIEF COMPLAINT: second breast cancer TREATMENT: definitive surgery pending  BREAST CANCER HISTORY: I last saw Karen [name] more than 10 years ago, when Dr. Harlow Asa performed a right partial mastectomy and axillary lymph node dissection for invasive cancer, upper outer quadrant. Khadijah was treated with adjuvant chemotherapy, then radiation, then 5 years of tamoxifen. At this point I cannot retrieve those details, but we have requested them from medical records.   More recently she had routine screening mammography at [facility] 10/04/2013 which showed breast density category C. There was no evidence of malignancy, but given the history and the breast density question, Dr. Philip Aspen requested bilateral breast MRIs from [facility] 11/14/2013. This showed, in the upper inner quadrant of the right breast, a 2.5 cm area of non-mass enhancement measuring 2.5 cm. The left breast was unremarkable and there were no abnormal appearing lymph nodes. There was a 7 mm nodule in the left hepatic lobe which is felt most likely to be a cyst.  Biopsy of the suspicious area in the right breast 11/21/2013 showed ([id number]) ductal carcinoma in situ, low-grade, estrogen receptor 100% positive, progesterone receptor 82% positive. In E-cadherin stain was positive in the majority of the tumor nests.  The patient's subsequent history is as detailed below  INTERVAL HISTORY: Karen [name] was evaluated in the breast cancer clinic 12/06/2013 accompanied by her son Karen [name]. by a granddaughter  REVIEW OF SYSTEMS: There were no specific symptoms leading to the original mammogram, which was routinely scheduled. The patient exercises regularly, chiefly by doing Zumba. She denies unusual  headaches, visual changes, nausea, vomiting, stiff neck, dizziness, or gait imbalance. There has been no cough, phlegm production, or pleurisy, no chest pain or pressure, and no change in bowel or bladder habits. The patient denies fever, rash, bleeding, unexplained fatigue or unexplained weight loss. Karen [name] has chronic problems with insomnia, feels like she is losing her hearing, sleeps on 3 pillows, and has some stress urinary incontinence issues. 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 She feels forgetful and anxious at times. Otherwise a detailed review of systems was otherwise entirely negative.  PAST MEDICAL HISTORY: Past Medical History  Diagnosis Date  . HTN (hypertension)   . Osteopenia     PAST SURGICAL HISTORY: Past Surgical History  Procedure Laterality Date  . Breast lumpectomy Right   . Combined laparoscopy w/ hysteroscopy      FAMILY HISTORY Family History  Problem Relation Age of Onset  . Hypertension Father   . Heart failure Mother   . Diabetes Mother   . Cancer Paternal Aunt   The patient's father died at the age of 78 from complications of hypertension. Her mother died at the age of 23 from heart disease. The patient had 2 brothers, one sister. On her father's side 2 aunts were diagnosed with breast cancer in their 2s. Also on her father's side a first cousin was diagnosed with breast cancer in her 54s.   GYNECOLOGIC HISTORY:  Menarche age 7, first live birth age 72, the patient is [name]. she went through the change of life around 32. She did not take hormone replacement. She never took birth control pills.  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 Her  husband Karen [name] used to be a Biomedical scientist. He is now retired as well. Their son Karen [name] is an Estate agent. Son Karen [name] is a Physiological scientist in [place]. Son Karen [name] is a businessman in [place] and daughter Karen [name] lives in [place] still where she works in a  [place]:  In [place]: History  Substance Use Topics  . Smoking status: Former Smoker    Types: Cigarettes    Quit date: 05/06/1991  . Smokeless tobacco: Not on file  . Alcohol Use: No     Colonoscopy: 2014  PAP: July 2013  Bone density: repeat due July 2015  Lipid panel:  No Known Allergies  Current Outpatient Prescriptions  Medication Sig Dispense Refill  . ALPRAZolam (XANAX) 0.5 MG tablet Take 1 tablet by mouth as needed.      Marland Kitchen EXFORGE 5-160 MG per tablet Take 1 tablet by mouth daily.      . metoprolol tartrate (LOPRESSOR) 25 MG tablet Take 1 tablet by mouth 2 (two) times daily.      . Multiple Vitamins-Minerals (CENTRUM SILVER ADULT 50+) TABS Take 1 tablet by mouth daily.      . simvastatin (ZOCOR) 40 MG tablet Take 0.5 tablets by mouth at bedtime.       No current facility-administered medications for this visit.    OBJECTIVE: middle aged [name] in no acute distress Filed Vitals:   12/06/13 0955  BP: 139/60  Pulse: 58  Temp: 97.7 F (36.5 C)  Resp: 18     There is no weight on file to calculate BMI.    ECOG FS:0 - Asymptomatic  Ocular: Sclerae unicteric, pupils  round and equal  Ear-nose-throat: Oropharynx clear, dentition  in good repair  Lymphatic: No cervical or supraclavicular adenopathy Lungs no rales or rhonchi, good excursion bilaterally Heart regular rate and rhythm, no murmur appreciated Abd soft, nontender, positive bowel sounds MSK no focal spinal tenderness, no joint edema Neuro: non-focal, well-oriented,  positive  affect Breasts:  The right breast is status post remote lumpectomy and radiation. A smaller than the left and slightly firmer. I do not palpate a mass and there is no skin or nipple change of concern. The right axilla is benign. The left breast is unremarkable.   LAB RESULTS:  CMP     Component Value Date/Time   NA 140 12/05/2013 0911   NA 138 10/31/2009 0130   K 4.4 12/05/2013  0911   K 3.7 10/31/2009 0130   CL 103 10/31/2009 0130   CO2 24 12/05/2013 0911   CO2 29 10/31/2009 0130   GLUCOSE 197* 12/05/2013 0911   GLUCOSE 111* 10/31/2009 0130   BUN 12.3 12/05/2013 0911   BUN 18 10/31/2009 0130   CREATININE 0.9 12/05/2013 0911   CREATININE 0.69 10/31/2009 0130   CALCIUM 9.2 12/05/2013 0911   CALCIUM 9.1 10/31/2009 0130   PROT 7.4 12/05/2013 0911   ALBUMIN 3.8 12/05/2013 0911   AST 33 12/05/2013 0911   ALT 34 12/05/2013 0911   ALKPHOS 45 12/05/2013 0911   BILITOT 0.45 12/05/2013 0911   GFRNONAA >60 10/31/2009 0130   GFRAA  Value: >60        The eGFR has been calculated using the MDRD equation. This calculation has not been validated in all clinical situations. eGFR's persistently <60 mL/min signify possible Chronic Kidney Disease. 10/31/2009 0130    I No results found for this basename: SPEP, UPEP,  kappa and lambda light  chains    Lab Results  Component Value Date   WBC 4.2 12/05/2013   NEUTROABS 2.2 12/05/2013   HGB 13.0 12/05/2013   HCT 39.4 12/05/2013   MCV 88.9 12/05/2013   PLT 181 12/05/2013      Chemistry      Component Value Date/Time   NA 140 12/05/2013 0911   NA 138 10/31/2009 0130   K 4.4 12/05/2013 0911   K 3.7 10/31/2009 0130   CL 103 10/31/2009 0130   CO2 24 12/05/2013 0911   CO2 29 10/31/2009 0130   BUN 12.3 12/05/2013 0911   BUN 18 10/31/2009 0130   CREATININE 0.9 12/05/2013 0911   CREATININE 0.69 10/31/2009 0130      Component Value Date/Time   CALCIUM 9.2 12/05/2013 0911   CALCIUM 9.1 10/31/2009 0130   ALKPHOS 45 12/05/2013 0911   AST 33 12/05/2013 0911   ALT 34 12/05/2013 0911   BILITOT 0.45 12/05/2013 0911       No results found for this basename: LABCA2    No components found with this basename: LABCA125    No results found for this basename: INR,  in the last 168 hours  Urinalysis    Component Value Date/Time   COLORURINE YELLOW 10/31/2009 [place] 10/31/2009 0054   LABSPEC 1.007 10/31/2009 0054   PHURINE 6.5 10/31/2009 0054     GLUCOSEU NEGATIVE 10/31/2009 0054   HGBUR NEGATIVE 10/31/2009 0054   BILIRUBINUR NEGATIVE 10/31/2009 0054   KETONESUR NEGATIVE 10/31/2009 0054   PROTEINUR NEGATIVE 10/31/2009 0054   UROBILINOGEN 0.2 10/31/2009 0054   NITRITE NEGATIVE 10/31/2009 0054   LEUKOCYTESUR SMALL* 10/31/2009 0054    STUDIES: Mr Breast Bilateral W Wo Contrast  11/17/2013   CLINICAL DATA:  History of right breast cancer in treated with lumpectomy, radiation, and chemotherapy in 1996. Dense breasts. Two aunts were diagnosed with breast cancer.  LABS:  Not applicable  EXAM: BILATERAL BREAST MRI WITH AND WITHOUT CONTRAST  TECHNIQUE: Multiplanar, multisequence MR images of both breasts were obtained prior to and following the intravenous administration of 37m of MultiHance.  THREE-DIMENSIONAL MR IMAGE RENDERING ON INDEPENDENT WORKSTATION:  Three-dimensional MR images were rendered by post-processing of the original MR data on an independent workstation. The three-dimensional MR images were interpreted, and findings are reported in the following complete MRI report for this study. Three dimensional images were evaluated at the independent DynaCad workstation  COMPARISON:  09/26/2013 and earlier  FINDINGS: Breast composition: c:  Heterogeneous fibroglandular tissue  Background parenchymal enhancement: Moderate  Right breast: Within the upper inner quadrant of the right breast 2.0 x 2.5 x 2.1 cm there is an area of non mass segmental enhancement which demonstrates persistent type enhancement kinetics. Although the findings may be related to asymmetric parenchymal enhancement, this would be atypical following radiation treatment. Therefore, biopsy should be considered. As the enhancement is not related to a mass, is unlikely that this abnormality will be detected by ultrasound.  Left breast: Scattered foci of enhancement are identified. No suspicious mass or enhancement identified.  Lymph nodes: No abnormal appearing lymph nodes.  Ancillary  findings: Within the left hepatic lobe there is a 7 mm nodule which is high signal intensity on T2 weighted images and low signal intensity on T1 weighted images. The lesion is not well evaluated on post contrast images given its size. Statistically, this is likely a benign lesion such as cyst or hemangioma.  IMPRESSION: 1. Asymmetric non mass enhancement within the upper inner  quadrant of the right breast warranting further evaluation in light of the patient's history of breast cancer. 2. No suspicious findings in the left breast. 3. Probably benign lesion within the liver, not fully characterized.  RECOMMENDATION: MR guided core biopsy of the right breast is recommended.  BI-RADS CATEGORY  4: Suspicious.   Electronically Signed   By: Shon Hale M.D.   On: 11/17/2013 10:37   Mm Digital Diagnostic Unilat R  11/25/2013   ADDENDUM REPORT: 11/25/2013 10:24  ADDENDUM: Pathology results: Pathology results from the MRI guided biopsy of area of asymmetric enhancement in the superior right breast revealed low grade ductal carcinoma in situ and atypical lobular hyperplasia. This is concordant with the imaging findings. The patient has been notified of the results. She is doing well and denies any biopsy site complications.  She has been instructed to call Janett Billow from [facility] to assist with setting her up with a breast surgeon. The patient has been instructed to call the [facility] with any questions or concerns.   Electronically Signed   By: Everlean Alstrom M.D.   On: 11/25/2013 10:24   11/25/2013   CLINICAL DATA:  Post MRI guided biopsy of an asymmetric area of enhancement in the superior right breast.  EXAM: DIAGNOSTIC RIGHT MAMMOGRAM POST ULTRASOUND BIOPSY  COMPARISON:  Previous exams  FINDINGS: Mammographic images were obtained following MRI guided biopsy of an asymmetric suspicious area of enhancement in the superior right breast. A cylindrical shaped biopsy marking clip is present in the targeted  region.  IMPRESSION: Cylindrical shaped biopsy marking clip present in targeted region post biopsy of an asymmetric suspicious area of enhancement in the upper right breast.  Final Assessment: Post Procedure Mammograms for Marker Placement  Electronically Signed: By: Everlean Alstrom M.D. On: 11/21/2013 09:19   Mr Rt Breast Bx Johnella Moloney Dev 1st Lesion Image Bx Spec Mr Guide  11/21/2013   CLINICAL DATA:  73 year old female with prior history of right breast cancer, now with an asymmetric area of irregular enhancement seen in the superior right breast on recent MRI.  EXAM: MRI GUIDED CORE NEEDLE BIOPSY OF THE RIGHT BREAST  TECHNIQUE: Multiplanar, multisequence MR imaging of the right breast was performed both before and after administration of intravenous contrast.  CONTRAST:  The patient received 11 cc of IV MultiHance.  COMPARISON:  Previous exams.  FINDINGS: I met with the patient, and we discussed the procedure of MRI guided biopsy, including risks, benefits, and alternatives. Specifically, we discussed the risks of infection, bleeding, tissue injury, clip migration, and inadequate sampling. Informed, written consent was given. The usual time out protocol was performed immediately prior to the procedure.  Using sterile technique, 2% Lidocaine, MRI guidance, and a 9 gauge vacuum assisted device, biopsy was performed of the area of irregular asymmetric enhancement in the superior right breast using a lateral approach. At the conclusion of the procedure, a cylindrical shaped tissue marker clip was deployed into the biopsy cavity. Follow-up 2-view mammogram was performed and dictated separately.  IMPRESSION: MRI guided biopsy of the area of the irregular asymmetric enhancement in the superior right breast. No apparent complications.   Electronically Signed   By: Everlean Alstrom M.D.   On: 11/21/2013 08:51    ASSESSMENT: 73 y.o. [place] woman originally from the [place]  (1) status post right lumpectomy and  axillary lymph node dissection in 1995 for an invasive breast cancer, treated adjuvantly with chemotherapy, radiation, and tamoxifen for 5 years  (2) Status post  right breast biopsy 11/21/2013 for ductal carcinoma in situ, low-grade, estrogen and progesterone receptor positive.  (3) genetics testing pending  PLAN: We spent the better part of today's hour-long appointment discussing the biology of breast cancer in general, and the specifics of the patient's tumor in particular. Chiyoko understands that noninvasive breast cancer is in itself not life threatening. The cancer cells are trapped in the ducts, and cannot travel to any vital organ. By the same token, it the entire breast is removed, the entire cancer is removed. The cure rate for noninvasive breast cancer with mastectomy is in the 99% range.  She also understands that lumpectomy alone is not adequate. Lumpectomy plus radiation would be a good choice but we cannot give her any more radiation to the right breast. Accordingly mastectomy of the right breast is the only choice for her.  She has had breast cancer twice and she has 3 relatives on the same side with breast cancer, so she qualifies for genetics testing. She tells me if she was found to have a deleterious mutation she would want to have bilateral mastectomies. Accordingly I have set up the patient for genetic testing. It may take up to a month to get results. I have sent a note to Dr. Dalbert Batman as this may affect the timing of surgery.  We also discussed reconstruction issues. Synai understands the concerns regarding body image change in the difference in weight between 1 breast and another, although the latter problem has largely been obviated I using appropriate prostheses. At this point she is inclined to not undergo reconstruction, but she has an appointment with Dr. Harlow Mares this coming week at which time she will make her definitive decision.  Oluwatomisin has a good understanding of the  overall plan. She agrees with it. She knows the goal of treatment in her case is cure. She will call with any problems that may develop before her next visit here.   Chauncey Cruel, MD   12/06/2013 10:04 AM

## 2013-12-08 ENCOUNTER — Telehealth (INDEPENDENT_AMBULATORY_CARE_PROVIDER_SITE_OTHER): Payer: Self-pay

## 2013-12-08 NOTE — Telephone Encounter (Signed)
Pt given appt per Dr Darrel Hoover request to see him later this month to review gene testing and treatment plan. Pt states Dr Jana Hakim has told pt he was ordering test put as of yet pt has not received call for this. I advised pt that a msg has been sent to [facility] to follow this and let us know what the plan for this will be.

## 2013-12-09 ENCOUNTER — Telehealth: Payer: Self-pay | Admitting: Oncology

## 2013-12-09 NOTE — Telephone Encounter (Signed)
S/W PT GAVE APPT 7/27 @ 2PM AND ADVISED PT LISA WILL CALL HER WITH AN GENETICS APPT. PT VERBALIZED UNDERSTANDING.

## 2013-12-10 ENCOUNTER — Telehealth: Payer: Self-pay | Admitting: *Deleted

## 2013-12-10 NOTE — Telephone Encounter (Signed)
Confirmed 12/15/13 genetic appt w/ pt.  Mailed calendar to pt.

## 2013-12-15 ENCOUNTER — Other Ambulatory Visit: Payer: 59

## 2013-12-15 ENCOUNTER — Ambulatory Visit (HOSPITAL_BASED_OUTPATIENT_CLINIC_OR_DEPARTMENT_OTHER): Payer: 59 | Admitting: Genetic Counselor

## 2013-12-15 ENCOUNTER — Encounter: Payer: Self-pay | Admitting: Genetic Counselor

## 2013-12-15 DIAGNOSIS — Z853 Personal history of malignant neoplasm of breast: Secondary | ICD-10-CM

## 2013-12-15 DIAGNOSIS — D0510 Intraductal carcinoma in situ of unspecified breast: Secondary | ICD-10-CM | POA: Insufficient documentation

## 2013-12-15 DIAGNOSIS — Z803 Family history of malignant neoplasm of breast: Secondary | ICD-10-CM

## 2013-12-15 DIAGNOSIS — D0590 Unspecified type of carcinoma in situ of unspecified breast: Secondary | ICD-10-CM

## 2013-12-15 DIAGNOSIS — C50919 Malignant neoplasm of unspecified site of unspecified female breast: Secondary | ICD-10-CM | POA: Insufficient documentation

## 2013-12-15 DIAGNOSIS — IMO0002 Reserved for concepts with insufficient information to code with codable children: Secondary | ICD-10-CM

## 2013-12-15 DIAGNOSIS — C50211 Malignant neoplasm of upper-inner quadrant of right female breast: Secondary | ICD-10-CM

## 2013-12-15 NOTE — Progress Notes (Signed)
Patient Name: Karen [name] Patient Age: 73 y.o. Encounter Date: 12/15/2013  Referring Physician: Lurline Del, MD  Primary Care Provider: Donnajean Lopes, MD   Karen [name], a 73 y.o. female, is being seen at the [facility] due to a personal and family history of breast cancer.  She presents to clinic today with her son, Karen [name], to discuss the possibility of a hereditary predisposition to cancer and discuss whether genetic testing is warranted.  HISTORY OF PRESENT ILLNESS: Karen [name] was initially diagnosed with right breast cancer in 1995, at the age of 41. She had a right partial mastectomy, radiation and chemotherapy. In 11/2013, at age 23, she was diagnosed with a right breast DCIS which was ER/PR positive. She is deciding regarding whether to have unilateral versus bilateral mastectomies.    Past Medical History  Diagnosis Date  . HTN (hypertension)   . Osteopenia   . Malignant neoplasm of breast (female), unspecified site   . DCIS (ductal carcinoma in situ)     Past Surgical History  Procedure Laterality Date  . Breast lumpectomy Right   . Combined laparoscopy w/ hysteroscopy     History   Social History  . Marital Status: Single    Spouse Name: N/A    Number of Children: N/A  . Years of Education: N/A   Occupational History  . retired Hydrologist    Professor   Social History Main Topics  . Smoking status: Former Smoker    Types: Cigarettes    Quit date: 05/06/1991  . Smokeless tobacco: Not on file  . Alcohol Use: No  . Drug Use: No  . Sexual Activity: Not on file   Other Topics Concern  . Not on file   Social History Narrative  . No narrative on file     FAMILY HISTORY:   During the visit, a 4-generation pedigree was obtained. Significant diagnoses include the following:  Family History  Problem Relation Age of Onset  . Hypertension Father   . Heart failure Mother   . Diabetes Mother   . Breast cancer Paternal Aunt     dx 38s;  deceased 87s  . Breast cancer Paternal Aunt     dx late 20s; deceased 42  . Leukemia Cousin     2 paternal female cousins    Additionally, Karen [name] had two other paternal aunts who died cancer-free, as well as 4 paternal uncles. Her mother died at 46, cancer-free, and was an only child.  Karen [name] ancestry is Filipino. There is no known Jewish ancestry and no consanguinity.  ASSESSMENT AND PLAN: Karen [name] is a 73 y.o. female with a personal and family history of breast cancer. She was diagnosed at ages 1 and 42. This history is not highly suggestive of a hereditary predisposition to cancer given her and her relatives' ages of diagnosis, but since she's had two breast primaries, BRCA1 and BRCA2 testing is indicated. We reviewed the characteristics, features and inheritance patterns of hereditary cancer syndromes. We also discussed genetic testing, including the process of testing, insurance coverage and implications of results. A negative result will be generally reassuring for her and her family.  Karen [name] wished to pursue genetic testing and a blood sample will be sent to [facility] for analysis of the BRCA1 and BRCA2 genes. We discussed the implications of a positive, negative and/ or Variant of Uncertain Significance (VUS) result. Results should be available in approximately 2 weeks and a STAT request was made  due to surgical management potentially being impacted by the results. Once obtain, we will contact her and address implications for her as well as address genetic testing for at-risk family members, if needed.    We encouraged Karen [name] to remain in contact with Cancer Genetics annually so that we can update the family history and inform her of any changes in cancer genetics and testing that may be of benefit for this family. Ms.  [name] questions were answered to her satisfaction today.   Thank you for the referral and allowing [place] to share in the care of your patient.   The  patient was seen for a total of 30 minutes, greater than 50% of which was spent face-to-face counseling. This patient was discussed with the overseeing provider who agrees with the above.

## 2013-12-24 ENCOUNTER — Encounter: Payer: Self-pay | Admitting: Genetic Counselor

## 2013-12-24 NOTE — Progress Notes (Signed)
Referring Physician: Lurline Del, MD  Ms. Barbuto was called today to discuss genetic test results. Please see the Genetics note from her visit on 12/15/13 for a detailed discussion of her personal and family history.  GENETIC TESTING: At the time of Ms. Drewry visit, we recommended she pursue genetic testing of the BRCA1 and BRCA2 genes. This test, which included sequencing and deletion/duplication analysis, was performed at [facility]. Testing was normal and did not reveal a mutation in these genes.  We discussed with Ms. An that since the current test is not perfect, it is possible there may be a gene mutation that current testing cannot detect, but that chance is small. We also discussed that it is possible that a different genetic factor, which was not part of this testing or has not yet been discovered, is responsible for the cancer diagnoses in the family. Given her family history, this is not highly likely. Should Ms. Willette wish to discuss or pursue this additional testing, we are happy to coordinate this at any time, but do not feel that she is at significant risk of harboring a mutation in a different gene.     CANCER SCREENING: Even though Ms. Baskin had two ipsilateral breast cancers, this result is generally reassuring for her and her family. We will defer her screening and management to her physician as she is making decisions regarding surgery.  FAMILY MEMBERS: Women in the family are at some increased risk of developing breast cancer, over the general population risk, simply due to the family history. We recommended they have a yearly mammogram beginning at age 38, a yearly clinical breast exam, and perform monthly breast self-exams. A gynecologic exam is recommended yearly. Colon cancer screening is recommended to begin by age 76.  Lastly, we discussed with Ms. Couse that cancer genetics is a rapidly advancing field and it is possible that new genetic tests will be appropriate  for her in the future. We encouraged her to remain in contact with [place] on an annual basis so we can update her personal and family histories, and let her know of advances in cancer genetics that may benefit the family. Our contact number was provided. Ms. Yusuf questions were answered to her satisfaction today, and she knows she is welcome to call anytime with additional questions.    Steele Berg, MS, [place] Certified Genetic Counseor phone: [phone] [email or web address]

## 2014-01-05 ENCOUNTER — Other Ambulatory Visit (INDEPENDENT_AMBULATORY_CARE_PROVIDER_SITE_OTHER): Payer: Self-pay

## 2014-01-05 ENCOUNTER — Ambulatory Visit (INDEPENDENT_AMBULATORY_CARE_PROVIDER_SITE_OTHER): Payer: Medicare Other | Admitting: General Surgery

## 2014-01-05 ENCOUNTER — Encounter (INDEPENDENT_AMBULATORY_CARE_PROVIDER_SITE_OTHER): Payer: Self-pay | Admitting: General Surgery

## 2014-01-05 VITALS — BP 130/70 | HR 59 | Temp 96.8°F | Resp 12 | Ht <= 58 in | Wt 121.2 lb

## 2014-01-05 DIAGNOSIS — C50211 Malignant neoplasm of upper-inner quadrant of right female breast: Secondary | ICD-10-CM

## 2014-01-05 DIAGNOSIS — C50219 Malignant neoplasm of upper-inner quadrant of unspecified female breast: Secondary | ICD-10-CM

## 2014-01-05 NOTE — Patient Instructions (Addendum)
You have met with Dr. Gunnar Bulla Magrinat.  You have met with Dr. Crissie Reese.  Your genetic testing is negative.  We have discussed your options, and you have stated that you would like to proceed with right total mastectomy without reconstruction at this time. We have talked about this at length.  You will be scheduled for right total mastectomy without reconstruction in the future.    Mastectomy, With or Without Reconstruction Mastectomy (removal of the breast) is a procedure most commonly used to treat cancer (tumor) of the breast. Different procedures are available for treatment. This depends on the stage of the tumor (abnormal growths). Discuss this with your caregiver, surgeon (a specialist for performing operations such as this), or oncologist (someone specialized in the treatment of cancer). With proper information, you can decide which treatment is best for you. Although the sound of the word cancer is frightening to all of [place], the new treatments and medications can be a source of reassurance and comfort. If there are things you are worried about, discuss them with your caregiver. He or she can help comfort you and your family. Some of the different procedures for treating breast cancer are:  Radical (extensive) mastectomy. This is an operation used to remove the entire breast, the muscles under the breast, and all of the glands (lymph nodes) under the arm. With all of the new treatments available for cancer of the breast, this procedure has become less common.  Modified radical mastectomy. This is a similar operation to the radical mastectomy described above. In the modified radical mastectomy, the muscles of the chest wall are not removed unless one of the lessor muscles is removed. One of the lessor muscles may be removed to allow better removal of the lymph nodes. The axillary lymph nodes are also removed. Rarely, during an axillary node dissection nerves to this area are damaged. Radiation  therapy is then often used to the area following this surgery.  A total mastectomy also known as a complete or simple mastectomy. It involves removal of only the breast. The lymph nodes and the muscles are left in place.  In a lumpectomy, the lump is removed from the breast. This is the simplest form of surgical treatment. A sentinel lymph node biopsy may also be done. Additional treatment may be required. RISKS AND COMPLICATIONS The main problems that follow removal of the breast include:  Infection (germs start growing in the wound). This can usually be treated with antibiotics (medications that kill germs).  Lymphedema. This means the arm on the side of the breast that was operated on swells because the lymph (tissue fluid) cannot follow the main channels back into the body. This only occurs when the lymph nodes have had to be removed under the arm.  There may be some areas of numbness to the upper arm and around the incision (cut by the surgeon) in the breast. This happens because of the cutting of or damage to some of the nerves in the area. This is most often unavoidable.  There may be difficulty moving the arm in a full range of motion (moving in all directions) following surgery. This usually improves with time following use and exercise.  Recurrence of breast cancer may happen with the very best of surgery and follow up treatment. Sometimes small cancer cells that cannot be seen with the naked eye have already spread at the time of surgery. When this happens other treatment is available. This treatment may be radiation, medications or  a combination of both. RECONSTRUCTION Reconstruction of the breast may be done immediately if there is not going to be post-operative radiation. This surgery is done for cosmetic (improve appearance) purposes to improve the physical appearance after the operation. This may be done in two ways:  It can be done using a saline filled prosthetic (an artificial  breast which is filled with salt water). Silicone breast implants are now re-approved by the FDA and are being commonly used.  Reconstruction can be done using the body's own muscle/fat/skin. Your caregiver will discuss your options with you. Depending upon your needs or choice, together you will be able to determine which procedure is best for you. Document Released: 03/21/2001 Document Revised: 03/20/2012 Document Reviewed: 11/12/2007 [facility] Patient Information 2015 [place], [place]. This information is not intended to replace advice given to you by your health care Dicie Edelen. Make sure you discuss any questions you have with your health care Johnluke Haugen.

## 2014-01-05 NOTE — Progress Notes (Signed)
Patient ID: Karen [name], female   DOB: 1940/07/25, 73 y.o.   MRN: [id number]  Chief Complaint  Patient presents with  . Follow-up    HPI Karen [name] is a 73 y.o. female.  She returns for further discussion and final treatment planning for her recurrent right breast cancer.  She has seen Dr. Dr. Jana Hakim, and he has concurred that she will need a mastectomy.  She has undergone genetic testing, and her genetic testing is negative.  She has had consultation with Dr. Crissie Reese, and has reviewed her reconstructive options.  She has thought about all these issues, discussed with her daughter who is with her today.   We have discussed this in detail and she has decided she wants to undergo a right total mastectomy without reconstruction at this time. She has visited [employer] store and feels that she will do well with a postmastectomy bra and prosthesis. She knows she can have delayed reconstruction if in the future if she changes her mind.  Initial presentation is summarized below : Karen [name] is a 73 y.o. female. She is referred back to [place] by Dr. Johnnette Gourd and Dr. Leanna Battles for evaluation of a second cancer in the right breast.  In 1995, Dr. Harlow Asa performed a right partial mastectomy, axillary lymph node dissection for invasive cancer, upper outer quadrant. She states she had radiation therapy and adjuvant chemotherapy through peripheral vein, and 5 years of tamoxifen. She was followed by Dr. Jana Hakim for a while but ultimately graduated from his care. She has not been seen in this practice for many years but remains fairly healthy.  Mammograms were performed at [facility] on 09/26/2013, breasts were noted to be dense but no focal abnormality was noted category 2. A screening MRI was performed which showed a 2.5 x 2.0 cm area of enhancement in the right breast, upper inner quadrant. The left breast looked normal. 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  She  is here today with her son.  She has very few comorbidities. Hyperlipidemia and hypertension. Basically is healthy.  Family history reveals breast cancer and 2 paternal aunts and one paternal first cousin.   HPI  Past Medical History  Diagnosis Date  . HTN (hypertension)   . Osteopenia   . Malignant neoplasm of breast (female), unspecified site   . DCIS (ductal carcinoma in situ)     Past Surgical History  Procedure Laterality Date  . Breast lumpectomy Right   . Combined laparoscopy w/ hysteroscopy      Family History  Problem Relation Age of Onset  . Hypertension Father   . Heart failure Mother   . Diabetes Mother   . Breast cancer Paternal Aunt     dx 51s; deceased 41s  . Breast cancer Paternal Aunt     dx late 70s; deceased 39  . Leukemia Cousin     2 paternal female cousins    Social History History  Substance Use Topics  . Smoking status: Former Smoker    Types: Cigarettes    Quit date: 05/06/1991  . Smokeless tobacco: Not on file  . Alcohol Use: No    No Known Allergies  Current Outpatient Prescriptions  Medication Sig Dispense Refill  . ALPRAZolam (XANAX) 0.5 MG tablet Take 1 tablet by mouth as needed.      Marland Kitchen amLODipine-valsartan (EXFORGE) 5-160 MG per tablet Take 1 tablet by mouth daily.      Marland Kitchen EXFORGE 5-160 MG per tablet  Take 1 tablet by mouth daily.      . metoprolol tartrate (LOPRESSOR) 25 MG tablet Take 1 tablet by mouth 2 (two) times daily.      . Multiple Vitamins-Minerals (CENTRUM SILVER ADULT 50+) TABS Take 1 tablet by mouth daily.      . simvastatin (ZOCOR) 40 MG tablet Take 0.5 tablets by mouth at bedtime.       No current facility-administered medications for this visit.    Review of Systems Review of Systems Review of Systems  Constitutional: Negative for fever, chills and unexpected weight change.  HENT: Negative for congestion, hearing loss, sore throat, trouble swallowing and voice change.  Eyes: Negative for visual disturbance.    Respiratory: Negative for cough and wheezing.  Cardiovascular: Negative for chest pain, palpitations and leg swelling.  Gastrointestinal: Negative for nausea, vomiting, abdominal pain, diarrhea, constipation, blood in stool, abdominal distention and anal bleeding.  Genitourinary: Negative for hematuria, vaginal bleeding and difficulty urinating.  Musculoskeletal: Positive for arthralgias, myalgias and neck pain.  Skin: Negative for rash and wound.  Neurological: Negative for seizures, syncope and headaches.  Hematological: Negative for adenopathy. Does not bruise/bleed easily.  Psychiatric/Behavioral: Negative for confusion.   Blood pressure 130/70, pulse 59, temperature 96.8 F (36 C), resp. rate 12, height 4\' 9"  (1.448 m), weight 121 lb 3.2 oz (54.976 kg).  Physical Exam Physical Exam Constitutional: She is oriented to person, place, and time. She appears well-developed and well-nourished. No distress.  HENT:  Head: Normocephalic and atraumatic.  Nose: Nose normal.  Mouth/Throat: No oropharyngeal exudate.  Eyes: Conjunctivae and EOM are normal. Pupils are equal, round, and reactive to light. Left eye exhibits no discharge. No scleral icterus.  Neck: Neck supple. No JVD present. No tracheal deviation present. No thyromegaly present.  Cardiovascular: Normal rate, regular rhythm, normal heart sounds and intact distal pulses.  No murmur heard.  Pulmonary/Chest: Effort normal and breath sounds normal. No respiratory distress. She has no wheezes. She has no rales. She exhibits no tenderness.    Abdominal: Soft. Bowel sounds are normal. She exhibits no distension and no mass. There is no tenderness. There is no rebound and no guarding.  Musculoskeletal: She exhibits no edema and no tenderness.  Lymphadenopathy:  She has no cervical adenopathy.  Neurological: She is alert and oriented to person, place, and time. She exhibits normal muscle tone. Coordination normal.  Skin: Skin is warm. No  rash noted. She is not diaphoretic. No erythema. No pallor.  Psychiatric: She has a normal mood and affect. Her behavior is normal. Judgment and thought content normal.   Data Reviewed Genetic testing. Office notes from Dr. Jana Hakim and Dr. Harlow Mares. Mild records.  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   This is most likely a second primary given the time interval and different quadrant   Genetic testing negative  History right partial mastectomy, axillary lymph node dissection, radiation therapy chemotherapy and antiestrogen therapy 1995. Details of her cancer are not known.   Hypertension  Hyperlipidemia,  excellent performance status     Plan    Scheduled for right total mastectomy. There is no indication for axillary surgery given the in situ nature of her disease and a history of a complete axillary lymph node dissection.  I discussed the indications, details, techniques, and numerous risks of the surgery with her and her daughter. They are aware of  the risk of bleeding, infection, skin necrosis, or swelling, or  numbness, shoulder disability, and other unforeseen problems. She is aware of the cosmetic deformity and ways to  manage that. All of her questions are answered. She understands all these issues. She agrees with this plan.       INGRAM,HAYWOOD M 01/05/2014, 6:04 PM

## 2014-01-12 ENCOUNTER — Telehealth: Payer: Self-pay | Admitting: *Deleted

## 2014-01-12 NOTE — Telephone Encounter (Signed)
Received call from patient stating her surgery has been moved to 02/09/14 and needs to reschedule her appointment with Dr. Jana Hakim.  Confirmed new appointment for 02/24/14 at 3pm with Dr. Jana Hakim.

## 2014-01-26 ENCOUNTER — Encounter (HOSPITAL_COMMUNITY): Payer: Self-pay | Admitting: Pharmacy Technician

## 2014-01-28 NOTE — Pre-Procedure Instructions (Signed)
VIKTORIYA GLASPY  01/28/2014   Your procedure is scheduled on:  Monday, Aug. 3  Report to [facility] Main Entrance "A" at 5:30 AM.  Call this number if you have problems the morning of surgery: [phone]   Remember:   Do not eat food or drink liquids after midnight.   Take these medicines the morning of surgery with A SIP OF WATER: alprazolam (xanax), metoprolol tartrate (lopressor)   Do not wear jewelry, make-up or nail polish.  Do not wear lotions, powders, or perfumes. You may wear deodorant.  Do not shave 48 hours prior to surgery. Men may shave face and neck.  Do not bring valuables to the hospital.  [facility] is not responsible  for any belongings or valuables.               Contacts, dentures or bridgework may not be worn into surgery.  Leave suitcase in the car. After surgery it may be brought to your room.  For patients admitted to the hospital, discharge time is determined by your treatment team.               Patients discharged the day of surgery will not be allowed to drive home.  Name and phone number of your driver:   Special Instructions: review preparing for surgery sheet   Please read over the following fact sheets that you were given: Pain Booklet, Coughing and Deep Breathing and Surgical Site Infection Prevention

## 2014-01-29 ENCOUNTER — Ambulatory Visit (HOSPITAL_COMMUNITY)
Admission: RE | Admit: 2014-01-29 | Discharge: 2014-01-29 | Disposition: A | Discharge status: Home / Self Care | Payer: Medicare Other | Source: Ambulatory Visit | Attending: General Surgery | Admitting: General Surgery

## 2014-01-29 ENCOUNTER — Encounter (HOSPITAL_COMMUNITY): Payer: Self-pay

## 2014-01-29 ENCOUNTER — Encounter (HOSPITAL_COMMUNITY)
Admission: RE | Admit: 2014-01-29 | Discharge: 2014-01-29 | Disposition: A | Discharge status: Home / Self Care | Payer: Medicare Other | Source: Ambulatory Visit | Attending: General Surgery | Admitting: General Surgery

## 2014-01-29 DIAGNOSIS — Z01818 Encounter for other preprocedural examination: Secondary | ICD-10-CM | POA: Diagnosis present

## 2014-01-29 DIAGNOSIS — I10 Essential (primary) hypertension: Secondary | ICD-10-CM | POA: Insufficient documentation

## 2014-01-29 DIAGNOSIS — C50919 Malignant neoplasm of unspecified site of unspecified female breast: Secondary | ICD-10-CM | POA: Diagnosis not present

## 2014-01-29 DIAGNOSIS — I7090 Unspecified atherosclerosis: Secondary | ICD-10-CM | POA: Diagnosis not present

## 2014-01-29 HISTORY — DX: Unspecified osteoarthritis, unspecified site: M19.90

## 2014-01-29 HISTORY — DX: Anxiety disorder, unspecified: F41.9

## 2014-01-29 LAB — CBC WITH DIFFERENTIAL/PLATELET
Basophils Absolute: 0 10*3/uL (ref 0.0–0.1)
Basophils Relative: 0 % (ref 0–1)
EOS ABS: 0.2 10*3/uL (ref 0.0–0.7)
EOS PCT: 4 % (ref 0–5)
HEMATOCRIT: 41.8 % (ref 36.0–46.0)
HEMOGLOBIN: 14 g/dL (ref 12.0–15.0)
LYMPHS ABS: 2.3 10*3/uL (ref 0.7–4.0)
Lymphocytes Relative: 38 % (ref 12–46)
MCH: 29.9 pg (ref 26.0–34.0)
MCHC: 33.5 g/dL (ref 30.0–36.0)
MCV: 89.1 fL (ref 78.0–100.0)
MONO ABS: 0.6 10*3/uL (ref 0.1–1.0)
MONOS PCT: 9 % (ref 3–12)
Neutro Abs: 3 10*3/uL (ref 1.7–7.7)
Neutrophils Relative %: 49 % (ref 43–77)
Platelets: 197 10*3/uL (ref 150–400)
RBC: 4.69 MIL/uL (ref 3.87–5.11)
RDW: 13.2 % (ref 11.5–15.5)
WBC: 6 10*3/uL (ref 4.0–10.5)

## 2014-01-29 LAB — COMPREHENSIVE METABOLIC PANEL
ALK PHOS: 50 U/L (ref 39–117)
ALT: 37 U/L — ABNORMAL HIGH (ref 0–35)
ANION GAP: 10 (ref 5–15)
AST: 31 U/L (ref 0–37)
Albumin: 4.2 g/dL (ref 3.5–5.2)
BUN: 13 mg/dL (ref 6–23)
CALCIUM: 10 mg/dL (ref 8.4–10.5)
CO2: 31 mEq/L (ref 19–32)
CREATININE: 0.71 mg/dL (ref 0.50–1.10)
Chloride: 102 mEq/L (ref 96–112)
GFR calc non Af Amer: 84 mL/min — ABNORMAL LOW (ref >90)
GLUCOSE: 67 mg/dL — AB (ref 70–99)
POTASSIUM: 4.7 meq/L (ref 3.7–5.3)
Sodium: 143 mEq/L (ref 137–147)
TOTAL PROTEIN: 8.1 g/dL (ref 6.0–8.3)
Total Bilirubin: 0.6 mg/dL (ref 0.3–1.2)

## 2014-01-29 NOTE — Progress Notes (Signed)
PCP: Dr. Bevelyn Buckles @ [facility] she is scheduled for a sleep study Aug. 16. Reason for the test is that she keeps waking up at night at 2 or 3 in the am and cannot go back to sleep. Stated Dr. Sharlett Iles ordered the test.   Cardiologist: none

## 2014-01-30 NOTE — Progress Notes (Signed)
Anesthesia Chart Review:  Patient is a 73 year old female scheduled for right total mastectomy on 02/09/14 by Dr. Dalbert Batman. She was recently diagnosed with a second cancer of the right breast.  History includes former smoker, HTN, anxiety, arthritis, right breast cancer '95 s/p right partial mastectomy and axillary LN dissection and chemoradiation. PCP is Dr. Bevelyn Buckles.  HEM-ONC is Dr. Jana Hakim.  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 No chest pain symptoms reported at PAT or at her last office with Dr. Dalbert Batman.  No CAD/MI/CHF or DM history reported.   Preoperative CXR and labs noted.   Further evaluation by her assigned anesthesiologist on the day of surgery.  If no acute changes then I would anticipate that she could proceed as planned.  George Hugh [facility] Stay [facility]/Anesthesiology Phone [phone] 01/30/2014 12:38 PM

## 2014-02-02 ENCOUNTER — Ambulatory Visit: Payer: 59 | Admitting: Oncology

## 2014-02-05 NOTE — H&P (Signed)
Karen [name]   MRN:  [id number]   Description: 73 year old female  Provider: Adin Hector, MD  Department: [facility]         Diagnoses      Breast cancer of upper-inner quadrant of right female breast    -  Primary      174.2           Current Vitals Most recent update: 01/05/2014  4:34 PM by Jacinto Reap, CMA      BP Pulse Temp(Src) Resp Ht Wt      130/70 59 96.8 F (36 C) 12 4\' 9"  (1.448 m) 121 lb 3.2 oz (54.976 kg)      BMI 26.22 kg/m2                   History and Physical       Status: Signed            Patient ID: Karen [name], female   DOB: [birth year], 73 y.o.   MRN: [id number]                  HPI Karen [name] is a 73 y.o. female.  She returns for further discussion and final treatment planning for her recurrent right breast cancer.   She has seen Dr. Dr. Jana Hakim, and he has concurred that she will need a mastectomy.   She has undergone genetic testing, and her genetic testing is negative.   She has had consultation with Dr. Crissie Reese, and has reviewed her reconstructive options.   She has thought about all these issues, discussed with her daughter who is with her today.   We have discussed this in detail and she has decided she wants to undergo a right total mastectomy without reconstruction at this time. She has visited [employer] store and feels that she will do well with a postmastectomy bra and prosthesis. She knows she can have delayed reconstruction if in the future if she changes her mind.   Initial presentation is summarized below : Karen [name] is a 73 y.o. female. She is referred back to [place] by Dr. Johnnette Gourd and Dr. Leanna Battles for evaluation of a second cancer in the right breast.   In 1995, Dr. Harlow Asa performed a right partial mastectomy, axillary lymph node dissection for invasive cancer, upper outer quadrant. She states she had radiation therapy and adjuvant chemotherapy through peripheral  vein, and 5 years of tamoxifen. She was followed by Dr. Jana Hakim for a while but ultimately graduated from his care. She has not been seen in this practice for many years but remains fairly healthy.   Mammograms were performed at [facility] on 09/26/2013, breasts were noted to be dense but no focal abnormality was noted category 2. A screening MRI was performed which showed a 2.5 x 2.0 cm area of enhancement in the right breast, upper inner quadrant. The left breast looked normal. 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   She is here today with her son.   She has very few comorbidities. Hyperlipidemia and hypertension. Basically is healthy.   Family history reveals breast cancer and 2 paternal aunts and one paternal first cousin.          Past Medical History   Diagnosis  Date   .  HTN (hypertension)     .  Osteopenia     .  Malignant neoplasm of breast (female), unspecified site     .  DCIS (ductal carcinoma in situ)           Past Surgical History   Procedure  Laterality  Date   .  Breast lumpectomy  Right     .  Combined laparoscopy w/ hysteroscopy             Family History   Problem  Relation  Age of Onset   .  Hypertension  Father     .  Heart failure  Mother     .  Diabetes  Mother     .  Breast cancer  Paternal Aunt         dx 22s; deceased 44s   .  Breast cancer  Paternal Aunt         dx late 86s; deceased 73   .  Leukemia  Cousin         2 paternal female cousins        Social History History   Substance Use Topics   .  Smoking status:  Former Smoker       Types:  Cigarettes       Quit date:  05/06/1991   .  Smokeless tobacco:  Not on file   .  Alcohol Use:  No        No Known Allergies    Current Outpatient Prescriptions   Medication  Sig  Dispense  Refill   .  ALPRAZolam (XANAX) 0.5 MG tablet  Take 1 tablet by mouth as needed.         Marland Kitchen  amLODipine-valsartan (EXFORGE) 5-160 MG per tablet  Take 1 tablet by mouth daily.         Marland Kitchen   EXFORGE 5-160 MG per tablet  Take 1 tablet by mouth daily.         .  metoprolol tartrate (LOPRESSOR) 25 MG tablet  Take 1 tablet by mouth 2 (two) times daily.         .  Multiple Vitamins-Minerals (CENTRUM SILVER ADULT 50+) TABS  Take 1 tablet by mouth daily.         .  simvastatin (ZOCOR) 40 MG tablet  Take 0.5 tablets by mouth at bedtime.                 Review of Systems    Constitutional: Negative for fever, chills and unexpected weight change.   HENT: Negative for congestion, hearing loss, sore throat, trouble swallowing and voice change.   Eyes: Negative for visual disturbance.   Respiratory: Negative for cough and wheezing.   Cardiovascular: Negative for chest pain, palpitations and leg swelling.   Gastrointestinal: Negative for nausea, vomiting, abdominal pain, diarrhea, constipation, blood in stool, abdominal distention and anal bleeding.   Genitourinary: Negative for hematuria, vaginal bleeding and difficulty urinating.   Musculoskeletal: Positive for arthralgias, myalgias and neck pain.   Skin: Negative for rash and wound.   Neurological: Negative for seizures, syncope and headaches.   Hematological: Negative for adenopathy. Does not bruise/bleed easily.   Psychiatric/Behavioral: Negative for confusion.    Blood pressure 130/70, pulse 59, temperature 96.8 F (36 C), resp. rate 12, height 4\' 9"  (1.448 m), weight 121 lb 3.2 oz (54.976 kg).   Physical Exam  Constitutional: She is oriented to person, place, and time. She appears well-developed and well-nourished. No distress.   HENT:   Head: Normocephalic and atraumatic.   Nose: Nose normal.   Mouth/Throat: No oropharyngeal exudate.   Eyes: Conjunctivae and EOM are normal.  Pupils are equal, round, and reactive to light. Left eye exhibits no discharge. No scleral icterus.   Neck: Neck supple. No JVD present. No tracheal deviation present. No thyromegaly present.   Cardiovascular: Normal rate, regular rhythm, normal  heart sounds and intact distal pulses.   No murmur heard.   Pulmonary/Chest: Effort normal and breath sounds normal. No respiratory distress. She has no wheezes. She has no rales. She exhibits no tenderness.    Abdominal: Soft. Bowel sounds are normal. She exhibits no distension and no mass. There is no tenderness. There is no rebound and no guarding.  Musculoskeletal: She exhibits no edema and no tenderness.  Lymphadenopathy:  She has no cervical adenopathy.  Neurological: She is alert and oriented to person, place, and time. She exhibits normal muscle tone. Coordination normal.   Skin: Skin is warm. No rash noted. She is not diaphoretic. No erythema. No pallor.  Psychiatric: She has a normal mood and affect. Her behavior is normal. Judgment and thought content normal.    Data Reviewed Genetic testing. Office notes from Dr. Jana Hakim and Dr. Harlow Mares. Mild records.   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   This is most likely a second primary given the time interval and different quadrant    Genetic testing negative   History right partial mastectomy, axillary lymph node dissection, radiation therapy chemotherapy and antiestrogen therapy 1995. Details of her cancer are not known.    Hypertension   Hyperlipidemia,   excellent performance status       Plan    Scheduled for right total mastectomy. There is no indication for axillary surgery given the in situ nature of her disease and a history of a complete axillary lymph node dissection.   I discussed the indications, details, techniques, and numerous risks of the surgery with her and her daughter. They are aware of  the risk of bleeding, infection, skin necrosis, or swelling, or numbness, shoulder disability, and other unforeseen problems. She is aware of the cosmetic deformity and ways to  manage that. All of her questions are answered. She understands all these issues. She agrees  with this plan.          Adin Hector

## 2014-02-07 HISTORY — PX: MASTECTOMY: SHX3

## 2014-02-08 MED ORDER — CEFAZOLIN SODIUM-DEXTROSE 2-3 GM-% IV SOLR
2.0000 g | INTRAVENOUS | Status: DC
  Filled 2014-02-08: qty 50

## 2014-02-08 MED ORDER — CHLORHEXIDINE GLUCONATE 4 % EX LIQD
1.0000 "application " | Freq: Once | CUTANEOUS | Status: DC
  Filled 2014-02-08: qty 15

## 2014-02-09 ENCOUNTER — Encounter (HOSPITAL_COMMUNITY)
Admission: RE | Disposition: A | Discharge status: Home / Self Care | Payer: Self-pay | Source: Ambulatory Visit | Attending: General Surgery

## 2014-02-09 ENCOUNTER — Ambulatory Visit (HOSPITAL_COMMUNITY)
Admission: RE | Admit: 2014-02-09 | Discharge: 2014-02-11 | Disposition: A | Discharge status: Home / Self Care | Payer: Medicare Other | Source: Ambulatory Visit | Attending: General Surgery | Admitting: General Surgery

## 2014-02-09 ENCOUNTER — Encounter (HOSPITAL_COMMUNITY): Payer: Medicare Other | Admitting: Vascular Surgery

## 2014-02-09 ENCOUNTER — Encounter (HOSPITAL_COMMUNITY): Payer: Self-pay | Admitting: *Deleted

## 2014-02-09 ENCOUNTER — Ambulatory Visit (HOSPITAL_COMMUNITY): Payer: Medicare Other | Admitting: Certified Registered Nurse Anesthetist

## 2014-02-09 DIAGNOSIS — Z79899 Other long term (current) drug therapy: Secondary | ICD-10-CM | POA: Diagnosis not present

## 2014-02-09 DIAGNOSIS — I10 Essential (primary) hypertension: Secondary | ICD-10-CM | POA: Diagnosis not present

## 2014-02-09 DIAGNOSIS — C50219 Malignant neoplasm of upper-inner quadrant of unspecified female breast: Secondary | ICD-10-CM | POA: Diagnosis not present

## 2014-02-09 DIAGNOSIS — Z87891 Personal history of nicotine dependence: Secondary | ICD-10-CM | POA: Insufficient documentation

## 2014-02-09 DIAGNOSIS — C50211 Malignant neoplasm of upper-inner quadrant of right female breast: Secondary | ICD-10-CM

## 2014-02-09 DIAGNOSIS — D0590 Unspecified type of carcinoma in situ of unspecified breast: Secondary | ICD-10-CM

## 2014-02-09 HISTORY — DX: Pure hypercholesterolemia, unspecified: E78.00

## 2014-02-09 HISTORY — PX: TOTAL MASTECTOMY: SHX6129

## 2014-02-09 LAB — CREATININE, SERUM
Creatinine, Ser: 0.59 mg/dL (ref 0.50–1.10)
GFR calc non Af Amer: 89 mL/min — ABNORMAL LOW (ref >90)

## 2014-02-09 LAB — CBC
HEMATOCRIT: 41 % (ref 36.0–46.0)
HEMOGLOBIN: 13.7 g/dL (ref 12.0–15.0)
MCH: 30 pg (ref 26.0–34.0)
MCHC: 33.4 g/dL (ref 30.0–36.0)
MCV: 89.9 fL (ref 78.0–100.0)
Platelets: 173 10*3/uL (ref 150–400)
RBC: 4.56 MIL/uL (ref 3.87–5.11)
RDW: 13.2 % (ref 11.5–15.5)
WBC: 9.4 10*3/uL (ref 4.0–10.5)

## 2014-02-09 SURGERY — MASTECTOMY, SIMPLE
Anesthesia: General | Site: Breast | Laterality: Right

## 2014-02-09 MED ORDER — ONDANSETRON HCL 4 MG/2ML IJ SOLN: 4.0000 mg | Freq: Four times a day (QID) | INTRAMUSCULAR | Status: DC | PRN

## 2014-02-09 MED ORDER — MIDAZOLAM HCL 2 MG/2ML IJ SOLN
INTRAMUSCULAR | Status: AC
  Filled 2014-02-09: qty 2

## 2014-02-09 MED ORDER — SIMVASTATIN 20 MG PO TABS
20.0000 mg | ORAL_TABLET | Freq: Every day | ORAL | Status: DC
  Administered 2014-02-09 – 2014-02-10 (×2): 20 mg via ORAL
  Filled 2014-02-09 (×3): qty 1

## 2014-02-09 MED ORDER — LACTATED RINGERS IV SOLN
INTRAVENOUS | Status: DC | PRN
  Administered 2014-02-09: 07:00:00 via INTRAVENOUS

## 2014-02-09 MED ORDER — GLYCOPYRROLATE 0.2 MG/ML IJ SOLN
INTRAMUSCULAR | Status: DC | PRN
  Administered 2014-02-09: 0.2 mg via INTRAVENOUS

## 2014-02-09 MED ORDER — HYDROMORPHONE HCL PF 1 MG/ML IJ SOLN
INTRAMUSCULAR | Status: AC
  Filled 2014-02-09: qty 1

## 2014-02-09 MED ORDER — AMLODIPINE BESYLATE-VALSARTAN 5-160 MG PO TABS: 1.0000 | ORAL_TABLET | Freq: Every day | ORAL | Status: DC

## 2014-02-09 MED ORDER — NEOSTIGMINE METHYLSULFATE 10 MG/10ML IV SOLN
INTRAVENOUS | Status: DC | PRN
  Administered 2014-02-09: 3 mg via INTRAVENOUS

## 2014-02-09 MED ORDER — ROCURONIUM BROMIDE 100 MG/10ML IV SOLN
INTRAVENOUS | Status: DC | PRN
  Administered 2014-02-09: 25 mg via INTRAVENOUS

## 2014-02-09 MED ORDER — ALPRAZOLAM 0.25 MG PO TABS: 0.2500 mg | ORAL_TABLET | Freq: Three times a day (TID) | ORAL | Status: DC | PRN

## 2014-02-09 MED ORDER — EPHEDRINE SULFATE 50 MG/ML IJ SOLN
INTRAMUSCULAR | Status: DC | PRN
  Administered 2014-02-09: 10 mg via INTRAVENOUS

## 2014-02-09 MED ORDER — ENOXAPARIN SODIUM 40 MG/0.4ML ~~LOC~~ SOLN
40.0000 mg | SUBCUTANEOUS | Status: DC
  Administered 2014-02-10 – 2014-02-11 (×2): 40 mg via SUBCUTANEOUS
  Filled 2014-02-09 (×3): qty 0.4

## 2014-02-09 MED ORDER — POTASSIUM CHLORIDE IN NACL 20-0.9 MEQ/L-% IV SOLN
INTRAVENOUS | Status: DC
  Administered 2014-02-09 – 2014-02-10 (×4): via INTRAVENOUS
  Filled 2014-02-09 (×8): qty 1000

## 2014-02-09 MED ORDER — FENTANYL CITRATE 0.05 MG/ML IJ SOLN
INTRAMUSCULAR | Status: AC
  Filled 2014-02-09: qty 5

## 2014-02-09 MED ORDER — HYDROCODONE-ACETAMINOPHEN 5-325 MG PO TABS
1.0000 | ORAL_TABLET | ORAL | Status: DC | PRN
  Administered 2014-02-09 – 2014-02-11 (×3): 1 via ORAL
  Filled 2014-02-09 (×2): qty 1
  Filled 2014-02-09: qty 2

## 2014-02-09 MED ORDER — HYDROMORPHONE HCL PF 1 MG/ML IJ SOLN: 1.0000 mg | INTRAMUSCULAR | Status: DC | PRN

## 2014-02-09 MED ORDER — ADULT MULTIVITAMIN W/MINERALS CH
1.0000 | ORAL_TABLET | Freq: Every day | ORAL | Status: DC
  Administered 2014-02-09 – 2014-02-11 (×3): 1 via ORAL
  Filled 2014-02-09 (×3): qty 1

## 2014-02-09 MED ORDER — ONDANSETRON HCL 4 MG PO TABS: 4.0000 mg | ORAL_TABLET | Freq: Four times a day (QID) | ORAL | Status: DC | PRN

## 2014-02-09 MED ORDER — HYDROMORPHONE HCL PF 1 MG/ML IJ SOLN
0.2500 mg | INTRAMUSCULAR | Status: DC | PRN
Start: 2014-02-09 — End: 2014-02-09
  Administered 2014-02-09: 0.5 mg via INTRAVENOUS

## 2014-02-09 MED ORDER — FENTANYL CITRATE 0.05 MG/ML IJ SOLN
INTRAMUSCULAR | Status: DC | PRN
  Administered 2014-02-09: 50 ug via INTRAVENOUS
  Administered 2014-02-09: 100 ug via INTRAVENOUS

## 2014-02-09 MED ORDER — ONDANSETRON HCL 4 MG/2ML IJ SOLN: 4.0000 mg | Freq: Once | INTRAMUSCULAR | Status: DC | PRN

## 2014-02-09 MED ORDER — PHENYLEPHRINE HCL 10 MG/ML IJ SOLN
INTRAMUSCULAR | Status: DC | PRN
  Administered 2014-02-09 (×2): 80 ug via INTRAVENOUS

## 2014-02-09 MED ORDER — METOPROLOL TARTRATE 25 MG PO TABS
25.0000 mg | ORAL_TABLET | Freq: Two times a day (BID) | ORAL | Status: DC
  Administered 2014-02-11: 25 mg via ORAL
  Filled 2014-02-09 (×4): qty 1

## 2014-02-09 MED ORDER — IRBESARTAN 150 MG PO TABS
150.0000 mg | ORAL_TABLET | Freq: Every day | ORAL | Status: DC
  Administered 2014-02-09 – 2014-02-11 (×2): 150 mg via ORAL
  Filled 2014-02-09 (×3): qty 1

## 2014-02-09 MED ORDER — AMLODIPINE BESYLATE 5 MG PO TABS
5.0000 mg | ORAL_TABLET | Freq: Every day | ORAL | Status: DC
  Administered 2014-02-09 – 2014-02-11 (×2): 5 mg via ORAL
  Filled 2014-02-09 (×3): qty 1

## 2014-02-09 MED ORDER — PROPOFOL 10 MG/ML IV BOLUS
INTRAVENOUS | Status: DC | PRN
  Administered 2014-02-09: 140 mg via INTRAVENOUS

## 2014-02-09 MED ORDER — ONDANSETRON HCL 4 MG/2ML IJ SOLN
INTRAMUSCULAR | Status: DC | PRN
  Administered 2014-02-09: 4 mg via INTRAVENOUS

## 2014-02-09 MED ORDER — PROPOFOL 10 MG/ML IV BOLUS
INTRAVENOUS | Status: AC
  Filled 2014-02-09: qty 20

## 2014-02-09 MED ORDER — GLYCOPYRROLATE 0.2 MG/ML IJ SOLN
INTRAMUSCULAR | Status: DC | PRN
  Administered 2014-02-09: 0.4 mg via INTRAVENOUS

## 2014-02-09 MED ORDER — CEFAZOLIN SODIUM-DEXTROSE 2-3 GM-% IV SOLR
2.0000 g | Freq: Three times a day (TID) | INTRAVENOUS | Status: AC
  Administered 2014-02-09: 2 g via INTRAVENOUS
  Filled 2014-02-09: qty 50

## 2014-02-09 MED ORDER — 0.9 % SODIUM CHLORIDE (POUR BTL) OPTIME
TOPICAL | Status: DC | PRN
  Administered 2014-02-09: 1000 mL

## 2014-02-09 MED ORDER — LIDOCAINE HCL (CARDIAC) 20 MG/ML IV SOLN
INTRAVENOUS | Status: DC | PRN
  Administered 2014-02-09: 80 mg via INTRAVENOUS

## 2014-02-09 MED ORDER — LACTATED RINGERS IV SOLN
INTRAVENOUS | Status: DC | PRN
  Administered 2014-02-09: 08:00:00 via INTRAVENOUS

## 2014-02-09 MED ORDER — DEXAMETHASONE SODIUM PHOSPHATE 4 MG/ML IJ SOLN
INTRAMUSCULAR | Status: DC | PRN
  Administered 2014-02-09: 4 mg via INTRAVENOUS

## 2014-02-09 MED ORDER — CEFAZOLIN SODIUM-DEXTROSE 2-3 GM-% IV SOLR
INTRAVENOUS | Status: DC | PRN
  Administered 2014-02-09: 2 g via INTRAVENOUS

## 2014-02-09 SURGICAL SUPPLY — 46 items
ADH SKN CLS APL DERMABOND .7 (GAUZE/BANDAGES/DRESSINGS) ×1
APPLIER CLIP 9.375 MED OPEN (MISCELLANEOUS) ×3
APR CLP MED 9.3 20 MLT OPN (MISCELLANEOUS) ×1
BINDER BREAST LRG (GAUZE/BANDAGES/DRESSINGS) ×2 IMPLANT
BINDER BREAST XLRG (GAUZE/BANDAGES/DRESSINGS) IMPLANT
CANISTER SUCTION 2500CC (MISCELLANEOUS) ×3 IMPLANT
CHLORAPREP W/TINT 26ML (MISCELLANEOUS) ×3 IMPLANT
CLIP APPLIE 9.375 MED OPEN (MISCELLANEOUS) ×1 IMPLANT
COVER SURGICAL LIGHT HANDLE (MISCELLANEOUS) ×3 IMPLANT
DERMABOND ADVANCED (GAUZE/BANDAGES/DRESSINGS) ×2
DERMABOND ADVANCED .7 DNX12 (GAUZE/BANDAGES/DRESSINGS) ×1 IMPLANT
DRAIN CHANNEL 19F RND (DRAIN) ×3 IMPLANT
DRAPE CHEST BREAST 15X10 FENES (DRAPES) ×3 IMPLANT
DRAPE PROXIMA HALF (DRAPES) ×2 IMPLANT
DRAPE UTILITY 15X26 W/TAPE STR (DRAPE) ×6 IMPLANT
ELECT BLADE 4.0 EZ CLEAN MEGAD (MISCELLANEOUS) ×3
ELECT CAUTERY BLADE 6.4 (BLADE) ×3 IMPLANT
ELECT REM PT RETURN 9FT ADLT (ELECTROSURGICAL) ×3
ELECTRODE BLDE 4.0 EZ CLN MEGD (MISCELLANEOUS) ×1 IMPLANT
ELECTRODE REM PT RTRN 9FT ADLT (ELECTROSURGICAL) ×1 IMPLANT
EVACUATOR SILICONE 100CC (DRAIN) ×3 IMPLANT
GLOVE BIOGEL PI IND STRL 7.0 (GLOVE) IMPLANT
GLOVE BIOGEL PI IND STRL 7.5 (GLOVE) IMPLANT
GLOVE BIOGEL PI INDICATOR 7.0 (GLOVE) ×2
GLOVE BIOGEL PI INDICATOR 7.5 (GLOVE) ×4
GLOVE ECLIPSE 7.5 STRL STRAW (GLOVE) ×2 IMPLANT
GLOVE EUDERMIC 7 POWDERFREE (GLOVE) ×3 IMPLANT
GLOVE SURG SS PI 7.0 STRL IVOR (GLOVE) ×2 IMPLANT
GOWN STRL REUS W/ TWL LRG LVL3 (GOWN DISPOSABLE) ×1 IMPLANT
GOWN STRL REUS W/ TWL XL LVL3 (GOWN DISPOSABLE) ×1 IMPLANT
GOWN STRL REUS W/TWL LRG LVL3 (GOWN DISPOSABLE) ×3
GOWN STRL REUS W/TWL XL LVL3 (GOWN DISPOSABLE) ×3
KIT BASIN OR (CUSTOM PROCEDURE TRAY) ×3 IMPLANT
KIT ROOM TURNOVER OR (KITS) ×3 IMPLANT
NS IRRIG 1000ML POUR BTL (IV SOLUTION) ×3 IMPLANT
PACK GENERAL/GYN (CUSTOM PROCEDURE TRAY) ×3 IMPLANT
PAD ABD 8X10 STRL (GAUZE/BANDAGES/DRESSINGS) ×2 IMPLANT
PAD ARMBOARD 7.5X6 YLW CONV (MISCELLANEOUS) ×3 IMPLANT
SPECIMEN JAR X LARGE (MISCELLANEOUS) ×3 IMPLANT
SPONGE GAUZE 4X4 12PLY STER LF (GAUZE/BANDAGES/DRESSINGS) ×2 IMPLANT
SUT ETHILON 3 0 FSL (SUTURE) ×3 IMPLANT
SUT MNCRL AB 4-0 PS2 18 (SUTURE) ×3 IMPLANT
SUT SILK 2 0 FS (SUTURE) ×3 IMPLANT
SUT VIC AB 3-0 SH 18 (SUTURE) ×7 IMPLANT
TOWEL OR 17X24 6PK STRL BLUE (TOWEL DISPOSABLE) ×3 IMPLANT
TOWEL OR 17X26 10 PK STRL BLUE (TOWEL DISPOSABLE) ×3 IMPLANT

## 2014-02-09 NOTE — Interval H&P Note (Signed)
History and Physical Interval Note:  02/09/2014 7:12 AM  Karen [name]  has presented today for surgery, with the diagnosis of recurrent cancer   The goals and the  various methods of treatment have been discussed with the patient and family. After consideration of risks, benefits and other options for treatment, the patient has consented to  Procedure(s): RIGHT TOTAL MASTECTOMY (Right) as a surgical intervention .  The patient's history has been reviewed, patient examined today, no change in status, stable for surgery.  I have reviewed the patient's chart and labs.  Questions were answered to the patient's satisfaction.     Adin Hector

## 2014-02-09 NOTE — Transfer of Care (Signed)
Immediate Anesthesia Transfer of Care Note  Patient: Karen [name]  Procedure(s) Performed: Procedure(s): RIGHT TOTAL MASTECTOMY (Right)  Patient Location: PACU  Anesthesia Type:General  Level of Consciousness: sedated and responds to stimulation  Airway & Oxygen Therapy: Patient Spontanous Breathing and Patient connected to nasal cannula oxygen  Post-op Assessment: Report given to PACU RN, Post -op Vital signs reviewed and stable and Patient moving all extremities X 4  Post vital signs: Reviewed and stable  Complications: No apparent anesthesia complications

## 2014-02-09 NOTE — Anesthesia Preprocedure Evaluation (Addendum)
Anesthesia Evaluation  Patient identified by MRN, date of birth, ID band Patient awake    Reviewed: Allergy & Precautions, H&P , NPO status , Patient's Chart, lab work & pertinent test results  Airway       Dental   Pulmonary former smoker,          Cardiovascular hypertension,     Neuro/Psych    GI/Hepatic   Endo/Other    Renal/GU      Musculoskeletal   Abdominal   Peds  Hematology   Anesthesia Other Findings Breast CA  Reproductive/Obstetrics                          Anesthesia Physical Anesthesia Plan  ASA: I  Anesthesia Plan: General   Post-op Pain Management:    Induction: Intravenous  Airway Management Planned: Oral ETT and LMA  Additional Equipment:   Intra-op Plan:   Post-operative Plan: Extubation in OR  Informed Consent: I have reviewed the patients History and Physical, chart, labs and discussed the procedure including the risks, benefits and alternatives for the proposed anesthesia with the patient or authorized representative who has indicated his/her understanding and acceptance.     Plan Discussed with: CRNA, Anesthesiologist and Surgeon  Anesthesia Plan Comments:         Anesthesia Quick Evaluation

## 2014-02-09 NOTE — Anesthesia Postprocedure Evaluation (Signed)
  Anesthesia Post-op Note  Patient: Karen [name]  Procedure(s) Performed: Procedure(s): RIGHT TOTAL MASTECTOMY (Right)  Patient Location: PACU  Anesthesia Type:General  Level of Consciousness: awake, oriented, sedated and patient cooperative  Airway and Oxygen Therapy: Patient Spontanous Breathing  Post-op Pain: mild  Post-op Assessment: Post-op Vital signs reviewed, Patient's Cardiovascular Status Stable, Respiratory Function Stable, Patent Airway, No signs of Nausea or vomiting and Pain level controlled  Post-op Vital Signs: stable  Last Vitals:  Filed Vitals:   02/09/14 0945  BP: 118/61  Pulse: 57  Temp:   Resp: 11    Complications: No apparent anesthesia complications

## 2014-02-09 NOTE — Op Note (Addendum)
Patient Name:           Karen [name]   Date of Surgery:        02/09/2014  Pre op Diagnosis:      Recurrent cancer, right breast  Post op Diagnosis:    Recurrent cancer, right  breast  Procedure:                 Right total mastectomy  Surgeon:                     Edsel Petrin. Dalbert Batman, M.D., FACS  Assistant:                      RNFA  Operative Indications:   This is a 73 y.o. Female who was referred back to [place] by Dr. Johnnette Gourd and Dr. Leanna Battles for evaluation of a second cancer in the right breast.  In 1995, Dr. Harlow Asa performed a right partial mastectomy, axillary lymph node dissection for invasive cancer, upper outer quadrant. She states she had radiation therapy and adjuvant chemotherapy through peripheral vein, and 5 years of tamoxifen. She was followed by Dr. Jana Hakim for a while but ultimately graduated from his care. She has not been seen in this practice for many years but remains fairly healthy.  Mammograms were performed at [facility] on 09/26/2013, breasts were noted to be dense but no focal abnormality was noted category 2. A screening MRI was performed which showed a 2.5 x 2.0 cm area of enhancement in the right breast, upper inner quadrant. The left breast looked normal. 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  She has seen Dr. Dr. Jana Hakim, and he has concurred that she will need a mastectomy.  She has undergone genetic testing, and her genetic testing is negative.  She has had consultation with Dr. Crissie Reese, and has reviewed her reconstructive options.She has declined immediate reconstruction. She is brought to the operating room electively  Operative Findings:       Chronic radiation changes were evident in the subcutaneous tissue and breast tissue. Dissection revealed increased fibrosis. There is no palpable mass. No palpable axillary adenopathy.   Procedure in Detail:          Following the induction of general endotracheal anesthesia the patient's  right chest wall,  axilla and upper arm were prepped and draped in a sterile fashion. Intravenous antibiotics were given. Surgical time out was performed. Using a marking pen I planned a transverse elliptical incision. I was able to remove the lumpectomy scar laterally with this incision and get a good closure.      The elliptical incision was made with the knife. Skin flaps were raised superiorly to the infraclavicular area, medially to the parasternal area, inferiorly to the anterior rectus sheath at the inframammary crease and laterally to latissimus dorsi muscle. The breast was dissected off of the pectoralis major and pectoralis minor muscle. Tail of Spence was excised with the specimen. The lateral skin margin was marked with a silk suture. The specimen was sent to the lab with the history attached. Hemostasis was excellent and achieved with electrocautery and a few metal clips. A 19 [place] Blake drain was placed, brought out through a separate stab incision inferolaterally and sutured to the skin and connected to a suction bulb. The mastectomy incision was closed in 2 layers. Subcutaneous tissue was closed with a numerous 3-0 Vicryl sutures and skin closed with a running subcuticular 4-0 Monocryl and  Dermabond. Dry bandages and a breast binder were placed. The patient tolerated the procedure well and taken to PACU in stable condition. EBL 50 cc or less. Counts correct. Complications none.     Edsel Petrin. Dalbert Batman, M.D., FACS General and Minimally Invasive Surgery Breast and Colorectal Surgery  02/09/2014 8:41 AM

## 2014-02-10 DIAGNOSIS — C50219 Malignant neoplasm of upper-inner quadrant of unspecified female breast: Secondary | ICD-10-CM | POA: Diagnosis not present

## 2014-02-10 LAB — CBC
HCT: 37.5 % (ref 36.0–46.0)
HEMOGLOBIN: 12.4 g/dL (ref 12.0–15.0)
MCH: 30.1 pg (ref 26.0–34.0)
MCHC: 33.1 g/dL (ref 30.0–36.0)
MCV: 91 fL (ref 78.0–100.0)
Platelets: ADEQUATE 10*3/uL (ref 150–400)
RBC: 4.12 MIL/uL (ref 3.87–5.11)
RDW: 13.3 % (ref 11.5–15.5)
WBC: 8.9 10*3/uL (ref 4.0–10.5)

## 2014-02-10 MED ORDER — SODIUM CHLORIDE 0.9 % IV BOLUS (SEPSIS)
1000.0000 mL | Freq: Once | INTRAVENOUS | Status: AC
  Administered 2014-02-10: 1000 mL via INTRAVENOUS

## 2014-02-10 NOTE — Progress Notes (Signed)
1 Day Post-Op  Subjective: BP little low, 90/56, but asymptomatic. Heart rate 75. Ambulates to bathroom and denies dizziness. No shortness of breath. No chest pain.Tolerating light diet without nausea. Pain reasonably controlled.  JP drainage 60 cc overnight. Serosanguineous  Objective: Vital signs in last 24 hours: Temp:  [96.8 F (36 C)-98 F (36.7 C)] 97.7 F (36.5 C) (08/04 0150) Pulse Rate:  [57-75] 75 (08/04 0150) Resp:  [11-21] 16 (08/04 0150) BP: (87-143)/(43-79) 90/56 mmHg (08/04 0234) SpO2:  [92 %-100 %] 95 % (08/04 0150) Weight:  [123 lb (55.792 kg)] 123 lb (55.792 kg) (08/03 0616)    Intake/Output from previous day: 08/03 0701 - 08/04 0700 In: 1746 [I.V.:1731] Out: 385 [Urine:300; Drains:60; Blood:25] Intake/Output this shift: Total I/O In: -  Out: 60 [Drains:60]   EXAM: General appearance: alert. Skin warm and dry. Mental status normal. No distress Resp: clear to auscultation bilaterally Breasts:, right mastectomy skin flaps looked good. Pink and healthy. No necrosis. Skin flaps flat against chest wall. No hematoma or evidence of bleeding.  Lab Results:  Results for orders placed during the hospital encounter of 02/09/14 (from the past 24 hour(s))  CBC     Status: None   Collection Time    02/09/14 11:50 AM      Result Value Ref Range   WBC 9.4  4.0 - 10.5 K/uL   RBC 4.56  3.87 - 5.11 MIL/uL   Hemoglobin 13.7  12.0 - 15.0 g/dL   HCT 41.0  36.0 - 46.0 %   MCV 89.9  78.0 - 100.0 fL   MCH 30.0  26.0 - 34.0 pg   MCHC 33.4  30.0 - 36.0 g/dL   RDW 13.2  11.5 - 15.5 %   Platelets 173  150 - 400 K/uL  CREATININE, SERUM     Status: Abnormal   Collection Time    02/09/14 11:50 AM      Result Value Ref Range   Creatinine, Ser 0.59  0.50 - 1.10 mg/dL   GFR calc non Af Amer 89 (*) >90 mL/min   GFR calc Af Amer >90  >90 mL/min     Studies/Results: No results found.  Marland Kitchen amLODipine  5 mg Oral Daily   And  . irbesartan  150 mg Oral Daily  . enoxaparin  (LOVENOX) injection  40 mg Subcutaneous Q24H  . metoprolol tartrate  25 mg Oral BID  . multivitamin with minerals  1 tablet Oral Daily  . simvastatin  20 mg Oral QHS  . sodium chloride  1,000 mL Intravenous Once     Assessment/Plan: s/p Procedure(s): RIGHT TOTAL MASTECTOMY   POD #1. Right total mastectomy for recurrent breast cancer.   stable without signs of bleeding  Mild hypotension, asymptomatic.  Significance unclear. This may be physiologic, but not ready for discharge home yet. Check CBC IV fluid bolus Ambulate in hall Discharge home tomorrow if stable.  @PROBHOSP @  LOS: 1 day    Harles Evetts M 02/10/2014  . .prob

## 2014-02-10 NOTE — Progress Notes (Signed)
4076 Normal saline bolus started at 564ml/hr as ordered, to run for 2hrs. 0600 JPbulb changed as per Dr. Dalbert Batman. Patient up to bathroom with no c/o dizziness. Will continue to monitor.

## 2014-02-11 ENCOUNTER — Telehealth (INDEPENDENT_AMBULATORY_CARE_PROVIDER_SITE_OTHER): Payer: Self-pay

## 2014-02-11 ENCOUNTER — Other Ambulatory Visit (INDEPENDENT_AMBULATORY_CARE_PROVIDER_SITE_OTHER): Payer: Self-pay

## 2014-02-11 DIAGNOSIS — R112 Nausea with vomiting, unspecified: Secondary | ICD-10-CM

## 2014-02-11 DIAGNOSIS — C50219 Malignant neoplasm of upper-inner quadrant of unspecified female breast: Secondary | ICD-10-CM | POA: Diagnosis not present

## 2014-02-11 MED ORDER — ONDANSETRON HCL 4 MG PO TABS: 4.0000 mg | ORAL_TABLET | Freq: Four times a day (QID) | ORAL | Status: DC | PRN

## 2014-02-11 MED ORDER — POLYETHYLENE GLYCOL 3350 17 G PO PACK
17.0000 g | PACK | Freq: Once | ORAL | Status: AC
  Administered 2014-02-11: 17 g via ORAL
  Filled 2014-02-11: qty 1

## 2014-02-11 MED ORDER — HYDROCODONE-ACETAMINOPHEN 5-325 MG PO TABS: 1.0000 | ORAL_TABLET | ORAL | Status: DC | PRN

## 2014-02-11 NOTE — Telephone Encounter (Signed)
Pt s/p right mastectomy on 02/09/14 by Dr Dalbert Batman. Pts son states that since she has left the hospital she has experienced some n/v. Pt denies any fevers or chills. Pt has not eating anything at this time. Informed pt that we call call in her some Zofran 4mg  1 tablet every 6 hrs as needed for nausea #15 per protocol.  Advised pt to make sure she stays hydrated with plenty of fluids and if pain meds is needed to make sure that she takes with food.  Informed son to give [place] a call back if n/v continues. Informed pt to call us if she starts experiencing any fevers or chills.  Son verbalized understanding and agrees with POC.

## 2014-02-11 NOTE — Discharge Summary (Signed)
Patient ID: Karen [name] [id number] 72 y.o. 1941/06/29  Admit date: 02/09/2014  Discharge date and time: No discharge date for patient encounter.  Admitting Physician: Adin Hector  Discharge Physician: Adin Hector  Admission Diagnoses: Recurrent cancer, left breast, Upper inner quadrant  Discharge Diagnoses: Recurrent cancer, left breast, upper inner quadrant  Operations: Procedure(s): RIGHT TOTAL MASTECTOMY  Admission Condition: good  Discharged Condition: good  Indication for Admission: This is a 73 y.o. Female  was referred back to [place] by Dr. Johnnette Gourd and Dr. Leanna Battles for evaluation of a second cancer in the right breast.  In 1995, Dr. Harlow Asa performed a right partial mastectomy, axillary lymph node dissection for invasive cancer, upper outer quadrant. She states she had radiation therapy and adjuvant chemotherapy through peripheral vein, and 5 years of tamoxifen. She was followed by Dr. Jana Hakim for a while but ultimately graduated from his care. She has not been seen in this practice for many years but remains fairly healthy.  Mammograms were performed at [facility] on 09/26/2013, breasts were noted to be dense but no focal abnormality was noted category 2. A screening MRI was performed which showed a 2.5 x 2.0 cm area of enhancement in the right breast, upper inner quadrant. The left breast looked normal. 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  She has seen Dr. Dr. Jana Hakim, and he has concurred that she will need a mastectomy.  She has undergone genetic testing, and her genetic testing is negative.  She has had consultation with Dr. Crissie Reese, and has reviewed her reconstructive options.She has declined immediate reconstruction.  She is brought to the operating room electively   Hospital Course: On the day of admission the patient was taken to the operating room and underwent a left total mastectomy. The surgery was uneventful. Final  pathology is pending at this time. On postop day 1 she was stable but had mild hypotension on several measurements  without tachycardia and without symptoms. We elected to continue her IV fluids.  Hemoglobin was checked and was 12.4 which was stable. There was no evidence of bleeding. We felt that the hypotension was either measurement error or response to narcotic and was not clinically significant. She resumed diet. She became ambulatory in the hall. Pain was well-controlled. At the time of discharge she looked good and felt good. The right mastectomy wound looked good. Skin flaps were pink and healthy. There was no hematoma or fluid collection. The drainage was serosanguineous.      She was given instructions in diet and activities. She was instructed in wound care and drain care. She was given a prescription for Norco for pain. She was asked to follow with me in the office in 7-8 days for a wound and drain check. She knows that we will call the pathology report to her.  Consults: None  Significant Diagnostic Studies: Surgical pathology, pending  Treatments: surgery: Left total mastectomy  Disposition: Home  Patient Instructions:    Medication List         ALPRAZolam 0.5 MG tablet  Commonly known as:  XANAX  Take 0.5 tablets by mouth every 8 (eight) hours as needed (for severe anxiety).     amLODipine-valsartan 5-160 MG per tablet  Commonly known as:  EXFORGE  Take 1 tablet by mouth daily.     CENTRUM SILVER ADULT 50+ Tabs  Take 1 tablet by mouth daily.     HYDROcodone-acetaminophen 5-325 MG per tablet  Commonly known as:  NORCO/VICODIN  Take  1-2 tablets by mouth every 4 (four) hours as needed for moderate pain.     metoprolol tartrate 25 MG tablet  Commonly known as:  LOPRESSOR  Take 25 mg by mouth 2 (two) times daily.     simvastatin 40 MG tablet  Commonly known as:  ZOCOR  Take 0.5 tablets by mouth at bedtime.        Activity: no driving for 2 weeks, no driving while  on analgesics and no heavy lifting for 3 weeks. Instructed to ambulate a lot. Diet: low fat, low cholesterol diet Wound Care: as directed  Follow-up:  With Dr. Dalbert Batman in 1 week.  Signed: Edsel Petrin. Dalbert Batman, M.D., FACS General and minimally invasive surgery Breast and Colorectal Surgery  02/11/2014, 7:50 AM

## 2014-02-11 NOTE — Progress Notes (Signed)
Discharge instructions and prescription given to patient with explanations given. Teachback demonstrated but patient seemed forgetful. IV removed per order. Dressing and JP drain intact. Taught patient how to empty drain. Dressing materials and cup to empty drain given to patient. Left with Binder in place. Taken down by wheelchair accompanied by NT. Son and daughter with patient.

## 2014-02-11 NOTE — Telephone Encounter (Signed)
V/M appt with DR. Ingram 02/18/14@1 :15 p wound/drain check

## 2014-02-11 NOTE — Discharge Instructions (Signed)
-  see above 

## 2014-02-12 ENCOUNTER — Telehealth (INDEPENDENT_AMBULATORY_CARE_PROVIDER_SITE_OTHER): Payer: Self-pay

## 2014-02-12 ENCOUNTER — Encounter (HOSPITAL_COMMUNITY): Payer: Self-pay | Admitting: General Surgery

## 2014-02-12 NOTE — Telephone Encounter (Signed)
Informed patient her Path results showed early stage ca and was removed with negative margins ,she will not need further surgery. Dr. Dalbert Batman will discuss it on her next ov.

## 2014-02-12 NOTE — Progress Notes (Signed)
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 Tell her that it was completely removed with a negative margin, and that she will not need any further surgery. This is good news. I will discuss this with her in detail at her next office visit.  hmi ______

## 2014-02-13 ENCOUNTER — Telehealth (INDEPENDENT_AMBULATORY_CARE_PROVIDER_SITE_OTHER): Payer: Self-pay

## 2014-02-13 NOTE — Telephone Encounter (Signed)
Pt notified of path result per Dr Darrel Hoover request. Pt will keep f/u appt next week.

## 2014-02-18 ENCOUNTER — Encounter (INDEPENDENT_AMBULATORY_CARE_PROVIDER_SITE_OTHER): Payer: Self-pay | Admitting: General Surgery

## 2014-02-18 ENCOUNTER — Ambulatory Visit (INDEPENDENT_AMBULATORY_CARE_PROVIDER_SITE_OTHER): Payer: Medicare Other | Admitting: General Surgery

## 2014-02-18 VITALS — BP 122/78 | HR 76 | Temp 98.0°F | Ht <= 58 in | Wt 122.0 lb

## 2014-02-18 DIAGNOSIS — C50219 Malignant neoplasm of upper-inner quadrant of unspecified female breast: Secondary | ICD-10-CM

## 2014-02-18 DIAGNOSIS — C50211 Malignant neoplasm of upper-inner quadrant of right female breast: Secondary | ICD-10-CM

## 2014-02-18 NOTE — Patient Instructions (Signed)
Your right mastectomy wound is healing without any obvious complications.  You are still draining too much to remove the drains today.  Take a walk every morning before he gets hot.  Continue to move your shoulder around  Keep your appt. with Dr. Jana Hakim on August 18  Return to see Dr. Dalbert Batman in one week. Hopefully we can remove the drain at that time.

## 2014-02-18 NOTE — Progress Notes (Signed)
Patient ID: Karen [name], female   DOB: [birth year], 73 y.o.   MRN: [id number]  History: This patient underwent right total mastectomy On 02/09/2014 for a recurrent cancer of the left breast. She had prior right partial mastectomy and axillary lymph node dissection in 1995 for an invasive cancer, he was given adjuvant radiation therapy and adjuvant chemotherapy in 5 years of tamoxifen. 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 Margins negative.'ve discussed her pathology report with her. She feels well. Minimal pain. Sleeping well at night. Normal appetite. Draining about 40 cc a day from the drain. She has an appointment to Dr. Jana Hakim next week  Exam: Patient looks well. Multiple family members with her. Very pleasant, as usual Right mastectomy wound is expected. Skin flaps looked good. No necrosis. No infection. The seroma. Drainage serosanguineous. Redressed  Assessment: Low grade DCIS right breast, upper inner quadrant. 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 This is a second primary given the time interval and different quadrant  Genetic testing negative  History right partial mastectomy, axillary lymph node dissection, radiation therapy chemotherapy and antiestrogen therapy 1995. Details of her cancer are not known.  Hypertension  Hyperlipidemia,  excellent performance status  Plan: Diet and activities discussed. Right shoulder range of motion exercises discussed. Leave drain in for now Appointment with Dr. Jana Hakim on August 18 Return to see me in one week. Possibly remove drain next week. Refer to physical therapy after drain removed.    Edsel Petrin. Dalbert Batman, M.D., [facility] General and Minimally invasive Surgery Breast and Colorectal Surgery Office:   [phone] Pager:   [phone]

## 2014-02-23 ENCOUNTER — Encounter: Payer: Self-pay | Admitting: *Deleted

## 2014-02-23 NOTE — Progress Notes (Signed)
[place] Work  Clinical Social Work was referred by patient for emotional support and assessment of psychosocial needs.  Clinical Social Worker contacted patient at home to offer support and assess for needs.  Patient recently had surgery for recurrent breast cancer.  Patient contact CSW requesting information on support services and emotional support.  Patient expressed feeling increased stress and overwhelmed since her surgery.  CSW validated patients feelings and provided a space for patient to discuss her feeling.  CSW informed pt of the support services at [facility].  Patient expressed interest in breast cancer support group and was agreeable to and [facility] referral.  Patient was appreciative of CSW contact and CSW encouraged pt to call with any questions or concerns.           Clinical Social Work interventions: Supportive Environmental health practitioner Guide Referral Breast Cancer Support Group    Johnnye Lana, MSW, LCSW, OSW-C Clinical Social Worker [place] [phone]

## 2014-02-24 ENCOUNTER — Telehealth: Payer: Self-pay | Admitting: Oncology

## 2014-02-24 ENCOUNTER — Ambulatory Visit (HOSPITAL_BASED_OUTPATIENT_CLINIC_OR_DEPARTMENT_OTHER): Payer: Medicare Other | Admitting: Oncology

## 2014-02-24 VITALS — BP 121/53 | HR 75 | Temp 97.8°F | Resp 20 | Ht 59.0 in | Wt 121.8 lb

## 2014-02-24 DIAGNOSIS — C50211 Malignant neoplasm of upper-inner quadrant of right female breast: Secondary | ICD-10-CM

## 2014-02-24 DIAGNOSIS — Z853 Personal history of malignant neoplasm of breast: Secondary | ICD-10-CM

## 2014-02-24 DIAGNOSIS — Z170 Estrogen receptor positive status [ER+]: Secondary | ICD-10-CM

## 2014-02-24 DIAGNOSIS — D0511 Intraductal carcinoma in situ of right breast: Secondary | ICD-10-CM

## 2014-02-24 DIAGNOSIS — Z9010 Acquired absence of unspecified breast and nipple: Secondary | ICD-10-CM

## 2014-02-24 DIAGNOSIS — D0590 Unspecified type of carcinoma in situ of unspecified breast: Secondary | ICD-10-CM

## 2014-02-24 NOTE — Telephone Encounter (Signed)
, °

## 2014-02-24 NOTE — Progress Notes (Signed)
[place]  [phone] [phone]     ID: [name] OB: [birth year]  MR#: [id number]  TMY#:[id number]  PCP: Donnajean Lopes, MD GYN:  Vanessa Kick SU: Fanny Skates OTHER MD: Crissie Reese, Laurence Spates  CHIEF COMPLAINT: second breast cancer TREATMENT: Status post right mastectomy  BREAST CANCER HISTORY: From the original intake note:  I last saw Karen [name] more than 10 years ago, when Dr. Harlow Asa performed a right partial mastectomy and axillary lymph node dissection for invasive cancer, upper outer quadrant. Karen [name] was treated with adjuvant chemotherapy, then radiation, then 5 years of tamoxifen. At this point I cannot retrieve those details, but we have requested them from medical records.   More recently she had routine screening mammography at [facility] 10/04/2013 which showed breast density category C. There was no evidence of malignancy, but given the history and the breast density question, Dr. Philip Aspen requested bilateral breast MRIs from [facility] imaging 11/14/2013. This showed, in the upper inner quadrant of the right breast, a 2.5 cm area of non-mass enhancement measuring 2.5 cm. The left breast was unremarkable and there were no abnormal appearing lymph nodes. There was a 7 mm nodule in the left hepatic lobe which is felt most likely to be a cyst.  Biopsy of the suspicious area in the right breast 11/21/2013 showed ([id number]) ductal carcinoma in situ, low-grade, estrogen receptor 100% positive, progesterone receptor 82% positive. In E-cadherin stain was positive in the majority of the tumor nests.  The patient's subsequent history is as detailed below  INTERVAL HISTORY: Karen [name] returns today for followup of her ductal carcinoma in situ accompanied by her daughter-in-law, Karen [name]. Since her last visit here she had genetic testing, which showed no evidence of BRCA1 or BRCA2 mutations. On 02/09/2014 she underwent right total mastectomy, with  the pathology ([id number]) showing a 2 cm area of ductal carcinoma in situ, grade 2, with ample margins (nearly 2 cm).  REVIEW OF SYSTEMS: Karen [name] tolerated the surgery well, with no unusual bleeding, fever, or pain. She still has a drain in place, with 8-10 cc of fluid in the bulb per day. She is taking occasional Tylenol for postoperative soreness. Aside from these issues a detailed review of systems today was entirely negative.  PAST MEDICAL HISTORY: Past Medical History  Diagnosis Date  . HTN (hypertension)   . Osteopenia   . Malignant neoplasm of breast (female), unspecified site   . DCIS (ductal carcinoma in situ)   . Anxiety   . High cholesterol   . Arthritis     "hands"    PAST SURGICAL HISTORY: Past Surgical History  Procedure Laterality Date  . Combined laparoscopy w/ hysteroscopy    . Mastectomy Right 02/09/2014  . Tubal ligation    . Breast lumpectomy Right 1990's  . Total mastectomy Right 02/09/2014    Procedure: RIGHT TOTAL MASTECTOMY;  Surgeon: Adin Hector, MD;  Location: [place];  Service: General;  Laterality: Right;    FAMILY HISTORY Family History  Problem Relation Age of Onset  . Hypertension Father   . Heart failure Mother   . Diabetes Mother   . Breast cancer Paternal Aunt     dx 38s; deceased 24s  . Breast cancer Paternal Aunt     dx late 1s; deceased 36  . Leukemia Cousin     2 paternal female cousins  The patient's father died at the age of 80 from complications of hypertension. Her mother died at the age of 58  from heart disease. The patient had 2 brothers, one sister. On her father's side 2 aunts were diagnosed with breast cancer in their 35s. Also on her father's side a first cousin was diagnosed with breast cancer in her 65s.   GYNECOLOGIC HISTORY:  Menarche age 73, first live birth age 73, the patient is Karen [name] P4. she went through the change of life around 73. She did not take hormone replacement. She never took birth control pills.  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 Her husband Karen [name] used to be a Biomedical scientist. He is now retired as well. Their son Karen [name] is an Estate agent. Son Karen [name] is a Physiological scientist in [place]. Son Karen [name] is a businessman in [place] and daughter Karen [name] lives in [place] still where she works in a [employer]:  In [place]: History  Substance Use Topics  . Smoking status: Former Smoker -- 0.50 packs/day for 30 years    Types: Cigarettes    Quit date: 05/06/1991  . Smokeless tobacco: Never Used  . Alcohol Use: No     Colonoscopy: 2014  PAP: July 2013  Bone density: repeat due July 2015  Lipid panel:  No Known Allergies  Current Outpatient Prescriptions  Medication Sig Dispense Refill  . ALPRAZolam (XANAX) 0.5 MG tablet Take 0.5 tablets by mouth every 8 (eight) hours as needed (for severe anxiety).       Marland Kitchen amLODipine-valsartan (EXFORGE) 5-160 MG per tablet Take 1 tablet by mouth daily.      . metoprolol tartrate (LOPRESSOR) 25 MG tablet Take 25 mg by mouth 2 (two) times daily.       . Multiple Vitamins-Minerals (CENTRUM SILVER ADULT 50+) TABS Take 1 tablet by mouth daily.      . simvastatin (ZOCOR) 40 MG tablet Take 0.5 tablets by mouth at bedtime.        No current facility-administered medications for this visit.    OBJECTIVE: middle aged 73 woman who appears stated age 73 Vitals:   02/24/14 1501  BP: 121/53  Pulse: 75  Temp: 97.8 F (36.6 C)  Resp: 20     Body mass index is 24.59 kg/(m^2).    ECOG FS:1 - Symptomatic but completely ambulatory  Sclerae unicteric, pupils equal and reactive Oropharynx clear and moist No cervical or supraclavicular adenopathy Lungs no rales or rhonchi Heart regular rate and rhythm Abd soft, nontender, positive bowel sounds MSK no focal spinal tenderness, no upper extremity lymphedema Neuro: nonfocal, well oriented, appropriate affect Breasts: The  right breast is status post total mastectomy. The incision is healing nicely, with no evidence of dehiscence, inflammation, or bleeding. There is a 1 drain still in place, with less than 10 cc serosanguineous fluid in the bulb. The right axilla is benign. The left breast is unremarkable.  LAB RESULTS:  CMP     Component Value Date/Time   NA 143 01/29/2014 0919   NA 140 12/05/2013 0911   K 4.7 01/29/2014 0919   K 4.4 12/05/2013 0911   CL 102 01/29/2014 0919   CO2 31 01/29/2014 0919   CO2 24 12/05/2013 0911   GLUCOSE 67* 01/29/2014 0919   GLUCOSE 197* 12/05/2013 0911   BUN 13 01/29/2014 0919   BUN 12.3 12/05/2013 0911   CREATININE 0.59 02/09/2014 1150   CREATININE 0.9 12/05/2013 0911   CALCIUM 10.0 01/29/2014 0919   CALCIUM 9.2 12/05/2013 0911   PROT 8.1 01/29/2014 0919  PROT 7.4 12/05/2013 0911   ALBUMIN 4.2 01/29/2014 0919   ALBUMIN 3.8 12/05/2013 0911   AST 31 01/29/2014 0919   AST 33 12/05/2013 0911   ALT 37* 01/29/2014 0919   ALT 34 12/05/2013 0911   ALKPHOS 50 01/29/2014 0919   ALKPHOS 45 12/05/2013 0911   BILITOT 0.6 01/29/2014 0919   BILITOT 0.45 12/05/2013 0911   GFRNONAA 89* 02/09/2014 1150   GFRAA >90 02/09/2014 1150    I No results found for this basename: SPEP,  UPEP,   kappa and lambda light chains    Lab Results  Component Value Date   WBC 8.9 02/10/2014   NEUTROABS 3.0 01/29/2014   HGB 12.4 02/10/2014   HCT 37.5 02/10/2014   MCV 91.0 02/10/2014   PLT PLATELET CLUMPS NOTED ON SMEAR, COUNT APPEARS ADEQUATE 02/10/2014      Chemistry      Component Value Date/Time   NA 143 01/29/2014 0919   NA 140 12/05/2013 0911   K 4.7 01/29/2014 0919   K 4.4 12/05/2013 0911   CL 102 01/29/2014 0919   CO2 31 01/29/2014 0919   CO2 24 12/05/2013 0911   BUN 13 01/29/2014 0919   BUN 12.3 12/05/2013 0911   CREATININE 0.59 02/09/2014 1150   CREATININE 0.9 12/05/2013 0911      Component Value Date/Time   CALCIUM 10.0 01/29/2014 0919   CALCIUM 9.2 12/05/2013 0911   ALKPHOS 50 01/29/2014 0919   ALKPHOS 45 12/05/2013  0911   AST 31 01/29/2014 0919   AST 33 12/05/2013 0911   ALT 37* 01/29/2014 0919   ALT 34 12/05/2013 0911   BILITOT 0.6 01/29/2014 0919   BILITOT 0.45 12/05/2013 0911       No results found for this basename: LABCA2    No components found with this basename: LABCA125    No results found for this basename: INR,  in the last 168 hours  Urinalysis    Component Value Date/Time   COLORURINE YELLOW 10/31/2009 [place] 10/31/2009 0054   LABSPEC 1.007 10/31/2009 0054   PHURINE 6.5 10/31/2009 [place] 10/31/2009 0054   HGBUR NEGATIVE 10/31/2009 0054   [place] 10/31/2009 0054   KETONESUR NEGATIVE 10/31/2009 0054   PROTEINUR NEGATIVE 10/31/2009 0054   UROBILINOGEN 0.2 10/31/2009 0054   NITRITE NEGATIVE 10/31/2009 0054   LEUKOCYTESUR SMALL* 10/31/2009 0054    STUDIES: Chest 2 View  01/29/2014   CLINICAL DATA:  Right breast cancer, preoperative for right mastectomy. Hypertension.  EXAM: CHEST  2 VIEW  COMPARISON:  10/31/2009  FINDINGS: Atherosclerotic calcification of the aortic arch noted with mild descending thoracic aortic tortuosity.  Right axillary clips are present. The right breast is smaller than the left, potentially related to the remote prior lumpectomy and radiation.  Heart size within normal limits. The lungs appear clear. Mild thoracic spondylosis. No compelling findings of thoracic osseous metastatic disease.  IMPRESSION: 1. No acute findings; the lungs appear clear. 2. Atherosclerosis. 3. Postoperative findings in the right axilla and breast.   Electronically Signed   By: Sherryl Barters M.D.   On: 01/29/2014 10:06    ASSESSMENT: 73 y.o. [place] woman originally from the [place]  (1) status post right lumpectomy and axillary lymph node dissection in 1995 for an invasive breast cancer, treated adjuvantly with chemotherapy, radiation, and tamoxifen for 5 years  (2) Status post right breast biopsy 11/21/2013 for ductal carcinoma in  situ, low-grade, estrogen and progesterone receptor positive.  (3)  genetics testing negative for a BRCA1 or 2 mutation  (4) status post right total mastectomy 02/09/2014 showing a 2 cm area of ductal carcinoma in situ, grade 2, with ample margins  PLAN: We discussed first of all the genetics results, which were favorable. We then went over the pathology of her recent surgery. Karen [name] understands her cancer was noninvasive, and therefore not life threatening. There was no need to do sentinel lymph nodes since this cancer cannot go outside the breast, the cells being trapped in the ducts were they started. Removal wall the docs accordingly removes all the cancer.  In fact the cure rate with mastectomy for noninvasive breast cancer approaches 100%.  We discussed anti-estrogens. The patient has a risk of approximately 1/2% per year of developing another breast cancer in the remaining breast. She can reduce this by half by taking anti-estrogens for 5 years. However there are concerns regarding blood clots or worsening of bone density. After a lengthy discussion Karen [name] is comfortable with observation alone.  I have made her a return appointment with me for 1 year from now. If she is comfortable and has no further questions at that point, she will probably be discharged from followup that.  Karen [name] has a good understanding of this plan. She agrees with it. She will call with any problems that may develop before next visit here.  Chauncey Cruel, MD   02/24/2014 3:07 PM

## 2014-02-25 ENCOUNTER — Ambulatory Visit (INDEPENDENT_AMBULATORY_CARE_PROVIDER_SITE_OTHER): Payer: Medicare Other | Admitting: General Surgery

## 2014-02-25 ENCOUNTER — Encounter (INDEPENDENT_AMBULATORY_CARE_PROVIDER_SITE_OTHER): Payer: Self-pay | Admitting: General Surgery

## 2014-02-25 ENCOUNTER — Other Ambulatory Visit (INDEPENDENT_AMBULATORY_CARE_PROVIDER_SITE_OTHER): Payer: Self-pay

## 2014-02-25 VITALS — BP 124/76 | HR 76 | Temp 98.0°F | Resp 18 | Ht 60.0 in | Wt 121.0 lb

## 2014-02-25 DIAGNOSIS — D0511 Intraductal carcinoma in situ of right breast: Secondary | ICD-10-CM

## 2014-02-25 DIAGNOSIS — D0590 Unspecified type of carcinoma in situ of unspecified breast: Secondary | ICD-10-CM

## 2014-02-25 DIAGNOSIS — D0591 Unspecified type of carcinoma in situ of right breast: Secondary | ICD-10-CM

## 2014-02-25 NOTE — Progress Notes (Signed)
Patient ID: Karen [name], female   DOB: [birth year], 73 y.o.   MRN: [id number]  History:  This patient underwent right total mastectomy On 02/09/2014 for a recurrent cancer of the left breast. She had prior right partial mastectomy and axillary lymph node dissection in 1995 for an invasive cancer, she was given adjuvant radiation therapy and adjuvant chemotherapy in 5 years of tamoxifen.  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 Margins negative. I've discussed her pathology report with her.  She feels well. Minimal pain. Sleeping well at night. Normal appetite. Draining about 15 cc a day from the drain.  She saw Dr. Jana Hakim yesterday. She was apparently told that no adjuvant therapy is indicated and he will see her in one year after her annual mammograms. She is pleased  Exam:  Patient looks well. Multiple family members with her. Very pleasant, as usual  Right mastectomy wound looks good. Skin flaps looked good. No necrosis. No infection. No retained fluid. Drain removed.  Assessment:  Low grade DCIS right breast, upper inner quadrant. 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 This is a second primary given the time interval and different quadrant  Genetic testing negative  History right partial mastectomy, axillary lymph node dissection, radiation therapy chemotherapy and antiestrogen therapy 1995. Details of her cancer are not known.  Hypertension  Hyperlipidemia,  excellent performance status   Plan:  Diet and activities discussed.  Referred to physical therapy Return to see me in one month     Kentrell Hallahan M. Dalbert Batman, M.D., [facility]  General and Minimally invasive Surgery  Breast and Colorectal Surgery  Office: [phone]  Pager: [phone]

## 2014-02-25 NOTE — Patient Instructions (Signed)
Your right mastectomy incision is healing normally and the skin looks good. There is no sign of infection or complication.  We removed your drain today  You may shower, starting tomorrow afternoon  You will be referred to physical therapy for range of motion exercises of your right shoulder  Drink lots of fluids and eat a well balanced, low-fat diet  Return to see Dr. Dalbert Batman in one month.

## 2014-03-03 ENCOUNTER — Ambulatory Visit: Payer: Medicare Other | Attending: General Surgery | Admitting: Physical Therapy

## 2014-03-03 DIAGNOSIS — IMO0001 Reserved for inherently not codable concepts without codable children: Secondary | ICD-10-CM | POA: Insufficient documentation

## 2014-03-03 DIAGNOSIS — Z9221 Personal history of antineoplastic chemotherapy: Secondary | ICD-10-CM | POA: Diagnosis not present

## 2014-03-03 DIAGNOSIS — C50919 Malignant neoplasm of unspecified site of unspecified female breast: Secondary | ICD-10-CM | POA: Insufficient documentation

## 2014-03-03 DIAGNOSIS — M24519 Contracture, unspecified shoulder: Secondary | ICD-10-CM | POA: Diagnosis not present

## 2014-03-03 DIAGNOSIS — Z9010 Acquired absence of unspecified breast and nipple: Secondary | ICD-10-CM | POA: Diagnosis not present

## 2014-03-03 DIAGNOSIS — M25519 Pain in unspecified shoulder: Secondary | ICD-10-CM | POA: Insufficient documentation

## 2014-03-03 DIAGNOSIS — Z923 Personal history of irradiation: Secondary | ICD-10-CM | POA: Insufficient documentation

## 2014-03-04 ENCOUNTER — Ambulatory Visit: Payer: Medicare Other | Admitting: Physical Therapy

## 2014-03-04 DIAGNOSIS — IMO0001 Reserved for inherently not codable concepts without codable children: Secondary | ICD-10-CM | POA: Diagnosis not present

## 2014-03-08 ENCOUNTER — Ambulatory Visit (INDEPENDENT_AMBULATORY_CARE_PROVIDER_SITE_OTHER): Payer: Medicare Other | Admitting: Neurology

## 2014-03-08 DIAGNOSIS — R0902 Hypoxemia: Secondary | ICD-10-CM

## 2014-03-08 DIAGNOSIS — G4733 Obstructive sleep apnea (adult) (pediatric): Secondary | ICD-10-CM

## 2014-03-10 ENCOUNTER — Ambulatory Visit: Payer: Medicare Other | Attending: General Surgery | Admitting: Physical Therapy

## 2014-03-10 DIAGNOSIS — Z9221 Personal history of antineoplastic chemotherapy: Secondary | ICD-10-CM | POA: Insufficient documentation

## 2014-03-10 DIAGNOSIS — C50919 Malignant neoplasm of unspecified site of unspecified female breast: Secondary | ICD-10-CM | POA: Diagnosis not present

## 2014-03-10 DIAGNOSIS — I10 Essential (primary) hypertension: Secondary | ICD-10-CM | POA: Insufficient documentation

## 2014-03-10 DIAGNOSIS — Z923 Personal history of irradiation: Secondary | ICD-10-CM | POA: Diagnosis not present

## 2014-03-10 DIAGNOSIS — M25519 Pain in unspecified shoulder: Secondary | ICD-10-CM | POA: Diagnosis not present

## 2014-03-10 DIAGNOSIS — M24519 Contracture, unspecified shoulder: Secondary | ICD-10-CM | POA: Insufficient documentation

## 2014-03-10 DIAGNOSIS — M949 Disorder of cartilage, unspecified: Secondary | ICD-10-CM

## 2014-03-10 DIAGNOSIS — Z9010 Acquired absence of unspecified breast and nipple: Secondary | ICD-10-CM | POA: Insufficient documentation

## 2014-03-10 DIAGNOSIS — M899 Disorder of bone, unspecified: Secondary | ICD-10-CM | POA: Diagnosis not present

## 2014-03-10 DIAGNOSIS — IMO0001 Reserved for inherently not codable concepts without codable children: Secondary | ICD-10-CM | POA: Insufficient documentation

## 2014-03-12 ENCOUNTER — Ambulatory Visit: Payer: Medicare Other | Admitting: Physical Therapy

## 2014-03-12 DIAGNOSIS — IMO0001 Reserved for inherently not codable concepts without codable children: Secondary | ICD-10-CM | POA: Diagnosis not present

## 2014-03-17 ENCOUNTER — Ambulatory Visit: Payer: Medicare Other | Admitting: Physical Therapy

## 2014-03-17 DIAGNOSIS — IMO0001 Reserved for inherently not codable concepts without codable children: Secondary | ICD-10-CM | POA: Diagnosis not present

## 2014-03-19 ENCOUNTER — Ambulatory Visit: Payer: Medicare Other | Admitting: Physical Therapy

## 2014-03-19 DIAGNOSIS — IMO0001 Reserved for inherently not codable concepts without codable children: Secondary | ICD-10-CM | POA: Diagnosis not present

## 2014-03-20 ENCOUNTER — Telehealth: Payer: Self-pay | Admitting: Neurology

## 2014-03-20 DIAGNOSIS — G4733 Obstructive sleep apnea (adult) (pediatric): Secondary | ICD-10-CM

## 2014-03-20 NOTE — Telephone Encounter (Signed)
Please call and notify the patient that the recent sleep study did confirm the diagnosis of obstructive sleep apnea and that I recommend treatment for this in the form of CPAP. This will require a repeat sleep study for proper titration and mask fitting. Please explain to patient and arrange for a CPAP titration study. I have placed an order in the chart. Thanks, Kiowa Peifer, MD, PhD [facility]  

## 2014-03-23 ENCOUNTER — Encounter: Payer: Self-pay | Admitting: Neurology

## 2014-03-23 NOTE — Telephone Encounter (Signed)
Patient was contacted and provided results of her NPSG performed on 03/08/14.  Patient was informed of the diagnosis of OSA and that a subsequent CPAP titration had been ordered.  Patient was hesitant and did not want to schedule a CPAP study until she received a copy of results and could view them herself.  A copy was put in mail for patients review.  Patient was informed that a copy of the results would also be sent to the referring MD; Dr. Leanna Battles.  Patient instructed to contact office in order to schedule CPAP titration study.

## 2014-03-24 ENCOUNTER — Ambulatory Visit: Payer: Medicare Other | Admitting: Physical Therapy

## 2014-03-26 ENCOUNTER — Ambulatory Visit: Payer: Medicare Other | Admitting: Physical Therapy

## 2014-03-26 ENCOUNTER — Encounter (INDEPENDENT_AMBULATORY_CARE_PROVIDER_SITE_OTHER): Payer: Medicare Other | Admitting: General Surgery

## 2014-03-26 ENCOUNTER — Telehealth: Payer: Self-pay | Admitting: *Deleted

## 2014-03-26 ENCOUNTER — Other Ambulatory Visit: Payer: Self-pay | Admitting: Obstetrics and Gynecology

## 2014-03-26 NOTE — Telephone Encounter (Signed)
Copy of note from [facility] dated 03/26/14 given to Dr. Jana Hakim for review and original sent to HIM to be scanned into patient's chart.

## 2014-03-30 ENCOUNTER — Ambulatory Visit: Payer: Medicare Other | Admitting: Physical Therapy

## 2014-03-30 DIAGNOSIS — IMO0001 Reserved for inherently not codable concepts without codable children: Secondary | ICD-10-CM | POA: Diagnosis not present

## 2014-03-30 LAB — CYTOLOGY - PAP

## 2014-05-03 ENCOUNTER — Ambulatory Visit (INDEPENDENT_AMBULATORY_CARE_PROVIDER_SITE_OTHER): Payer: Medicare Other | Admitting: Neurology

## 2014-05-03 DIAGNOSIS — G4761 Periodic limb movement disorder: Secondary | ICD-10-CM

## 2014-05-03 DIAGNOSIS — G4733 Obstructive sleep apnea (adult) (pediatric): Secondary | ICD-10-CM

## 2014-05-06 ENCOUNTER — Other Ambulatory Visit: Payer: Self-pay | Admitting: Obstetrics and Gynecology

## 2014-05-08 ENCOUNTER — Telehealth: Payer: Self-pay | Admitting: Neurology

## 2014-05-08 DIAGNOSIS — G4733 Obstructive sleep apnea (adult) (pediatric): Secondary | ICD-10-CM

## 2014-05-08 NOTE — Telephone Encounter (Signed)
Please call and inform patient that I have entered an order for treatment with PAP. She did well during the latest sleep study with CPAP. We will, therefore, arrange for a machine for home use through a DME (durable medical equipment) company of Her choice; and I will see the patient back in follow-up in about 6 weeks. Please also explain to the patient that I will be looking out for compliance data downloaded from the machine, which can be done remotely through a modem at times or stored on an SD card in the back of the machine. At the time of the followup appointment we will discuss sleep study results and how it is going with PAP treatment at home. Please advise patient to bring Her machine at the time of the visit; at least for the first visit, even though this is cumbersome. Bringing the machine for every visit after that may not be needed, but often helps for the first visit. Please also make sure, the patient has a follow-up appointment with me in about 6 weeks from the setup date, thanks.   Nyna Chilton, MD, PhD [facility]  

## 2014-05-13 ENCOUNTER — Encounter: Payer: Self-pay | Admitting: *Deleted

## 2014-05-13 NOTE — Telephone Encounter (Signed)
Patient was contacted and provided the results of her CPAP titration study.  Patient was in agreement with beginning the recommended treatment and was referred to [facility] for CPAP set up.  The patient was mailed a copy of the test results and Dr. Leanna Battles was faxed a copy.   Patient instructed to contact our office 6-8 weeks post set up to schedule a follow up appointment.

## 2014-06-16 ENCOUNTER — Ambulatory Visit (INDEPENDENT_AMBULATORY_CARE_PROVIDER_SITE_OTHER): Payer: Medicare Other | Admitting: Family Medicine

## 2014-06-16 VITALS — BP 162/88 | HR 70 | Temp 98.1°F | Resp 16 | Ht <= 58 in | Wt 124.6 lb

## 2014-06-16 DIAGNOSIS — R21 Rash and other nonspecific skin eruption: Secondary | ICD-10-CM

## 2014-06-16 DIAGNOSIS — L309 Dermatitis, unspecified: Secondary | ICD-10-CM

## 2014-06-16 LAB — POCT SKIN KOH: Skin KOH, POC: NEGATIVE

## 2014-06-16 MED ORDER — TRIAMCINOLONE ACETONIDE 0.1 % EX CREA: 1.0000 "application " | TOPICAL_CREAM | Freq: Two times a day (BID) | CUTANEOUS | Status: DC

## 2014-06-16 NOTE — Progress Notes (Signed)
Subjective: 73 year old lady who is here with a rash on her right chest wall this is been there about a week. Initially there is a little spot just lateral to the area of the right breast which has formed a minimally crusted 1.5 cm slightly excoriated area. Then she broke out with a fine rash across the right upper abdomen and right low chest wall below the breast area. It itches badly. She is use some Desitin on it.  She had a mastectomy this summer. She had had a breast cancer almost 20 years ago with a lumpectomy, chemotherapy, and radiation. This time they decided to just go ahead and do a mastectomy for early disease. She did not have any radiation or chemotherapy along with that. She sees her surgeon back for that sometime after the turn of the year.  Objective: There is the initial spot 1.5 cm as noted above. This had a little more flaking skin on it. This was scraped for KOH exam. The patient has a rash about 6 x 8" in diameter of a very fine erythematous confluent maculopapular rash just on the area below the breasts right upper abdomen and low chest wall area not extending in a radicular fashion to the back. It does not look like shingles. It is very itchy.  Assessment: Nonspecific dermatitis right chest wall, probably allergic in some fashion. Rule out fungal element.  Plan: Await KOH  Results for orders placed or performed in visit on 06/16/14  POCT Skin KOH  Result Value Ref Range   Skin KOH, POC Negative    Nonspecific dermatitis  Plan: Triamcinolone cream  Return if not improving. Patient is very anxious about this.

## 2014-06-16 NOTE — Patient Instructions (Signed)
Apply triamcinolone cream twice daily to area of rash  If it improves good. If it is not improving at all over the next 3 days return for recheck if necessary.

## 2014-06-17 ENCOUNTER — Telehealth: Payer: Self-pay

## 2014-06-17 NOTE — Telephone Encounter (Signed)
Jonni Sanger called regarding patient being set up and states she is not happy with the machine and would like for you to contact patient to discuss the issues and/or schedule appt with Dr. Rexene Alberts.

## 2014-06-26 ENCOUNTER — Ambulatory Visit (INDEPENDENT_AMBULATORY_CARE_PROVIDER_SITE_OTHER): Payer: Medicare Other

## 2014-06-26 ENCOUNTER — Ambulatory Visit (INDEPENDENT_AMBULATORY_CARE_PROVIDER_SITE_OTHER): Payer: Medicare Other | Admitting: Internal Medicine

## 2014-06-26 VITALS — BP 124/76 | HR 67 | Temp 97.8°F | Resp 16 | Ht <= 58 in | Wt 125.0 lb

## 2014-06-26 DIAGNOSIS — M4692 Unspecified inflammatory spondylopathy, cervical region: Secondary | ICD-10-CM

## 2014-06-26 DIAGNOSIS — M4120 Other idiopathic scoliosis, site unspecified: Secondary | ICD-10-CM

## 2014-06-26 DIAGNOSIS — M47812 Spondylosis without myelopathy or radiculopathy, cervical region: Secondary | ICD-10-CM

## 2014-06-26 DIAGNOSIS — M542 Cervicalgia: Secondary | ICD-10-CM

## 2014-06-26 DIAGNOSIS — M546 Pain in thoracic spine: Secondary | ICD-10-CM

## 2014-06-26 MED ORDER — PREDNISONE 10 MG PO TABS: ORAL_TABLET | ORAL | Status: DC

## 2014-06-26 MED ORDER — METHOCARBAMOL 750 MG PO TABS: 750.0000 mg | ORAL_TABLET | Freq: Four times a day (QID) | ORAL | Status: DC

## 2014-06-26 NOTE — Patient Instructions (Addendum)
Cervical Sprain A cervical sprain is an injury in the neck in which the strong, fibrous tissues (ligaments) that connect your neck bones stretch or tear. Cervical sprains can range from mild to severe. Severe cervical sprains can cause the neck vertebrae to be unstable. This can lead to damage of the spinal cord and can result in serious nervous system problems. The amount of time it takes for a cervical sprain to get better depends on the cause and extent of the injury. Most cervical sprains heal in 1 to 3 weeks. CAUSES  Severe cervical sprains may be caused by:   Contact sport injuries (such as from football, rugby, wrestling, hockey, auto racing, gymnastics, diving, martial arts, or boxing).   Motor vehicle collisions.   Whiplash injuries. This is an injury from a sudden forward and backward whipping movement of the head and neck.  Falls.  Mild cervical sprains may be caused by:   Being in an awkward position, such as while cradling a telephone between your ear and shoulder.   Sitting in a chair that does not offer proper support.   Working at a poorly Landscape [employer] station.   Looking up or down for long periods of time.  SYMPTOMS   Pain, soreness, stiffness, or a burning sensation in the front, back, or sides of the neck. This discomfort may develop immediately after the injury or slowly, 24 hours or more after the injury.   Pain or tenderness directly in the middle of the back of the neck.   Shoulder or upper back pain.   Limited ability to move the neck.   Headache.   Dizziness.   Weakness, numbness, or tingling in the hands or arms.   Muscle spasms.   Difficulty swallowing or chewing.   Tenderness and swelling of the neck.  DIAGNOSIS  Most of the time your health care provider can diagnose a cervical sprain by taking your history and doing a physical exam. Your health care provider will ask about previous neck injuries and any known neck  problems, such as arthritis in the neck. X-rays may be taken to find out if there are any other problems, such as with the bones of the neck. Other tests, such as a CT scan or MRI, may also be needed.  TREATMENT  Treatment depends on the severity of the cervical sprain. Mild sprains can be treated with rest, keeping the neck in place (immobilization), and pain medicines. Severe cervical sprains are immediately immobilized. Further treatment is done to help with pain, muscle spasms, and other symptoms and may include:  Medicines, such as pain relievers, numbing medicines, or muscle relaxants.   Physical therapy. This may involve stretching exercises, strengthening exercises, and posture training. Exercises and improved posture can help stabilize the neck, strengthen muscles, and help stop symptoms from returning.  HOME CARE INSTRUCTIONS   Put ice on the injured area.   Put ice in a plastic bag.   Place a towel between your skin and the bag.   Leave the ice on for 15-20 minutes, 3-4 times a day.   If your injury was severe, you may have been given a cervical collar to wear. A cervical collar is a two-piece collar designed to keep your neck from moving while it heals.  Do not remove the collar unless instructed by your health care provider.  If you have long hair, keep it outside of the collar.  Ask your health care provider before making any adjustments to your collar. Minor  adjustments may be required over time to improve comfort and reduce pressure on your chin or on the back of your head.  Ifyou are allowed to remove the collar for cleaning or bathing, follow your health care provider's instructions on how to do so safely.  Keep your collar clean by wiping it with mild soap and water and drying it completely. If the collar you have been given includes removable pads, remove them every 1-2 days and hand wash them with soap and water. Allow them to air dry. They should be completely  dry before you wear them in the collar.  If you are allowed to remove the collar for cleaning and bathing, wash and dry the skin of your neck. Check your skin for irritation or sores. If you see any, tell your health care provider.  Do not drive while wearing the collar.   Only take over-the-counter or prescription medicines for pain, discomfort, or fever as directed by your health care provider.   Keep all follow-up appointments as directed by your health care provider.   Keep all physical therapy appointments as directed by your health care provider.   Make any needed adjustments to your workstation to promote good posture.   Avoid positions and activities that make your symptoms worse.   Warm up and stretch before being active to help prevent problems.  SEEK MEDICAL CARE IF:   Your pain is not controlled with medicine.   You are unable to decrease your pain medicine over time as planned.   Your activity level is not improving as expected.  SEEK IMMEDIATE MEDICAL CARE IF:   You develop any bleeding.  You develop stomach upset.  You have signs of an allergic reaction to your medicine.   Your symptoms get worse.   You develop new, unexplained symptoms.   You have numbness, tingling, weakness, or paralysis in any part of your body.  MAKE SURE YOU:   Understand these instructions.  Will watch your condition.  Will get help right away if you are not doing well or get worse. Document Released: 04/23/2007 Document Revised: 07/01/2013 Document Reviewed: 01/01/2013 [facility] Patient Information 2015 [place], [place]. This information is not intended to replace advice given to you by your health care provider. Make sure you discuss any questions you have with your health care provider. Place neck pain patient instructions here. Scoliosis Scoliosis is the name given to a spine that curves sideways.Scoliosis can cause twisting of your shoulders, hips, chest, back,  and rib cage.  CAUSES  The cause of scoliosis is not always known. It may be caused by a birth defect or by a disease that can cause muscular dysfunction and imbalance, such as cerebral palsy and muscular dystrophy.  RISK FACTORS Having a disease that causes muscle disease or dysfunction. SIGNS AND SYMPTOMS Scoliosis often has no signs or symptoms.If they are present, they may include:  Unequal size of one body side compared to the other (asymmetry).  Visible curvature of the spine.  Pain. The pain may limit physical activity.  Shortness of breath.  Bowel or bladder issues. DIAGNOSIS A skilled health care provider will perform an evaluation. This will involve:  Taking your history.  Performing a physical examination.  Performing a neurological exam to detect nerve or muscle function loss.  Range of motion studies on the spine.  X-rays. An MRI may also be obtained. TREATMENT  Treatment varies depending on the nature, extent, and severity of the disease. If the curvature is not  great, you may need only observation. A brace may be used to prevent scoliosis from progressing. A brace may also be needed during growth spurts. Physical therapy may be of benefit. Surgery may be required.  HOME CARE INSTRUCTIONS   Your health care provider may suggest exercises to strengthen your muscles. Perform them as directed.  Ask your health care provider before participating in any sports.   If you have been prescribed an orthopedic brace, wear it as instructed by your health care provider. SEEK MEDICAL CARE IF: Your brace causes the skin to become sore (chafe) or is uncomfortable.  SEEK IMMEDIATE MEDICAL CARE IF:  You have back pain that is not relieved by the medicines prescribed by your health care provider.   Your legs feel weak or you lose function in your legs.  You lose some bowel or bladder control.  Document Released: 06/23/2000 Document Revised: 07/01/2013 Document  Reviewed: 03/03/2013 [facility] Patient Information 2015 [place], [place]. This information is not intended to replace advice given to you by your health care provider. Make sure you discuss any questions you have with your health care provider.
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
## 2014-06-29 ENCOUNTER — Telehealth: Payer: Self-pay | Admitting: *Deleted

## 2014-06-29 NOTE — Telephone Encounter (Signed)
Patient was contacted and returned my call.  This patient has attempted to return her CPAP machine after minimal time using it.  She continues to state that she cannot use the CPAP machine and wants to return it.  I attempted to explain to her the risks involved with untreated sleep apnea but she does not seem to understand.  She states numerous other health problems and concerns that she is currently dealing with.  I presented the concept of using a nasal pillow interface but she did not seem interested and is asking permission to return CPAP equipment to [place].

## 2014-06-30 NOTE — Telephone Encounter (Signed)
Maybe we can offer her an appt to talk about OSA and treatment? Pls call pt to schedule appt

## 2014-07-07 HISTORY — PX: CATARACT EXTRACTION: SUR2

## 2014-07-13 ENCOUNTER — Ambulatory Visit (INDEPENDENT_AMBULATORY_CARE_PROVIDER_SITE_OTHER): Payer: Medicare Other | Admitting: Emergency Medicine

## 2014-07-13 VITALS — BP 138/76 | HR 75 | Temp 97.6°F | Resp 16 | Ht 58.5 in | Wt 124.0 lb

## 2014-07-13 DIAGNOSIS — M542 Cervicalgia: Secondary | ICD-10-CM
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
## 2014-07-13 NOTE — Patient Instructions (Signed)

## 2014-07-16 ENCOUNTER — Other Ambulatory Visit: Payer: Self-pay | Admitting: Neurology

## 2014-07-16 ENCOUNTER — Encounter: Payer: Self-pay | Admitting: Neurology

## 2014-07-16 DIAGNOSIS — Z9989 Dependence on other enabling machines and devices: Principal | ICD-10-CM

## 2014-07-16 DIAGNOSIS — G4733 Obstructive sleep apnea (adult) (pediatric): Secondary | ICD-10-CM

## 2014-07-20 ENCOUNTER — Encounter: Payer: Self-pay | Admitting: Neurology

## 2014-08-25 ENCOUNTER — Encounter: Payer: Self-pay | Admitting: Neurology

## 2014-08-25 ENCOUNTER — Ambulatory Visit (INDEPENDENT_AMBULATORY_CARE_PROVIDER_SITE_OTHER): Payer: Medicare Other | Admitting: Neurology

## 2014-08-25 VITALS — BP 121/74 | HR 76 | Temp 98.3°F | Ht 58.5 in | Wt 123.0 lb

## 2014-08-25 DIAGNOSIS — G4733 Obstructive sleep apnea (adult) (pediatric): Secondary | ICD-10-CM

## 2014-08-25 DIAGNOSIS — Z9989 Dependence on other enabling machines and devices: Principal | ICD-10-CM

## 2014-08-25 NOTE — Progress Notes (Signed)
Subjective:    Patient ID: Karen [name] is a 74 y.o. female.  HPI     Interim history:   Karen [name] is a very pleasant 74 year old right-handed woman with an underlying medical history of breast cancer on the right, status post lumpectomy, chemotherapy and radiation therapy, hypertension, osteopenia, hypertension, and eczema, who has presents for follow-up consultation of her obstructive sleep apnea after her recent sleep studies. The patient is unaccompanied today. I first met her on 05/05/2013 at the request of her primary care physician, at which time she complained of nonrestorative sleep, sleep disruption, snoring, and daytime somnolence. I invited her back for sleep study. She had a baseline sleep study on 03/08/2014 followed by a CPAP titration study on 05/03/2014 and went over her test results with her in detail today. Her sleep efficiency was 84.5% with a prolonged sleep latency of 29.5 minutes and wake after sleep onset of 39.5 minutes with mild to moderate sleep fragmentation noted. She had an increased percentage of slow-wave sleep and a normal percentage of REM sleep with a mildly reduced REM latency. She had mild PLMS with minimal arousals. She had mild intermittent snoring. Total AHI was 14.8 per hour, rising to 48.4 per hour during REM sleep. Average oxygen saturation was 92% with a nadir of 76% in REM sleep. Time below 88% saturation was 20 minutes. Based on these test results I asked her to return for a full night CPAP titration study which she had on 05/03/2014. Sleep efficiency was 90.5%. Sleep latency was normal. Wake after sleep onset was 36.5 minutes with mild to moderate sleep fragmentation noted. She had moderate PLMS with mild arousals. She had slow-wave sleep at 12.7% and REM sleep at 18.7% with a slightly reduced REM latency. She had no significant EKG or EEG changes. She was titrated from 5-15 cm and also tried on BiPAP of 16/12 cm to 18/14 cm. She did reasonably well on CPAP  of 14 cm. Brief supine REM sleep was achieved. AHI was 0 per hour on the pressure of 14 cm. Based on those test results I prescribed CPAP therapy.  I reviewed her compliance data from 06/11/2014 through 06/22/2014 which is a total of 12 days during which time she used her machine 7 days with percent used days greater than 4 hours of 8%, indicating poor compliance. AHI at 0.1 per hour and 95th percentile of leak at 12.2 L/m, adequate. Pressure at 14 cm with EPR of 3. Average usage of only 1 hour and 26 minutes for all days and 2 hours and 27 minutes 4 days on machine.  I reviewed her compliance data on 06/20/2014 through 07/19/2014 which is a total of 30 days during which time she used her machine only 5 days with percent used days greater than 4 hours of only 3%, indicating noncompliance, residual AHI at 0.4 per hour and leak acceptable at the same pressure.  Today I reviewed her compliance data from 07/22/2014 through 08/20/2014 which is a total of 30 days during which time she used her machine 27 days with percent used days greater than 4 hours of 50%, indicating much improved compliance but still suboptimal with an average usage of 3 hours and 33 minutes and residual AHI low at 0.5 per hour and leak acceptable at 15.1 L/m at the 95th percentile. The patient called in December 2015, reporting that she was not using her CPAP machine and wanted to return it. She was advised to come in for follow-up appointment.  Today, she reports that she has trouble tolerating the mask and the pressure. She often wakes up with dry mouth. The strap of the mask leaves a mark on her face. The mass hurts her upper jaw. She went to her DME for severe mouth dryness and was advised to increase the humidity, but then she had too much moisture. She is struggling. However, compared to pre-treatment, she feels, her sleep is better. She has been using her upper dentures, but then, her upper lip and upper gums hurt. She is now on  sertraline 50 mg once daily. She has also been started on low-dose metformin once daily.  Her typical bedtime is reported to be around 9 PM and usual wake time is around 6 AM. Sleep onset typically occurs within 30-60 minutes. She reports feeling marginally rested upon awakening. She wakes up on an average 1 times in the middle of the night and has to go to the bathroom rarely in a typical night. She reports no morning headaches.   She reports occasional excessive daytime somnolence (EDS) and Her Epworth Sleepiness Score (ESS) is 6/24 today. She has not fallen asleep while driving. The patient has been taking a scheduled nap, which is usually after lunch and 1 hour to 1.5 hours long. She denies dreaming in a nap and reports feeling refreshed after a nap. She has been known to snore for the past many years. Snoring is reportedly moderate, but not associated with choking sounds and witnessed apneas. The patient admits to a rare sense of choking or strangling feeling. There is no report of nighttime reflux, with no nighttime cough experienced. The patient has not noted any RLS symptoms and is not known to kick while asleep or before falling asleep. There is no family history of RLS or OSA.   She is a restless sleeper and in the morning, the bed is quite disheveled.    She denies cataplexy, sleep paralysis, hypnagogic or hypnopompic hallucinations, or sleep attacks. She does not report any vivid dreams, nightmares, dream enactments, or parasomnias, such as sleep talking or sleep walking. The patient has not had a sleep study or a home sleep test.   She consumes 0 caffeinated beverages per day, usually in the form of decaff coffee in the morning.    Her bedroom is usually dark and cool. There is a TV in the bedroom and usually it is on at night.   Her Past Medical History Is Significant For: Past Medical History  Diagnosis Date  . HTN (hypertension)   . Osteopenia   . Malignant neoplasm of breast (female),  unspecified site   . DCIS (ductal carcinoma in situ)   . Anxiety   . High cholesterol   . Arthritis     "hands"    Her Past Surgical History Is Significant For: Past Surgical History  Procedure Laterality Date  . Combined laparoscopy w/ hysteroscopy    . Mastectomy Right 02/2014  . Tubal ligation    . Breast lumpectomy Right 1990's  . Total mastectomy Right 02/09/2014    Procedure: RIGHT TOTAL MASTECTOMY;  Surgeon: Adin Hector, MD;  Location: [facility];  Service: General;  Laterality: Right;  . Cataract extraction  07/07/14    left eye    Her Family History Is Significant For: Family History  Problem Relation Age of Onset  . Hypertension Father   . Heart failure Mother   . Diabetes Mother   . Breast cancer Paternal Aunt     dx  85s; deceased 15s  . Breast cancer Paternal Aunt     dx late 75s; deceased 62  . Leukemia Cousin     2 paternal female cousins    Her Social History Is Significant For: History   Social History  . Marital Status: Married    Spouse Name: N/A  . Number of Children: N/A  . Years of Education: N/A   Occupational History  . retired Hydrologist    Professor   Social History Main Topics  . Smoking status: Former Smoker -- 0.50 packs/day for 30 years    Types: Cigarettes    Quit date: 05/06/1991  . Smokeless tobacco: Never Used  . Alcohol Use: No  . Drug Use: No  . Sexual Activity: Not on file   Other Topics Concern  . None   Social History Narrative    Her Allergies Are:  No Known Allergies:   Her Current Medications Are:  Outpatient Encounter Prescriptions as of 08/25/2014  Medication Sig  . ALPRAZolam (XANAX) 0.5 MG tablet Take 0.5 tablets by mouth every 8 (eight) hours as needed (for severe anxiety).   Marland Kitchen amLODipine-valsartan (EXFORGE) 5-160 MG per tablet Take 1 tablet by mouth daily.  . metoprolol tartrate (LOPRESSOR) 25 MG tablet Take 25 mg by mouth 2 (two) times daily.   . Multiple Vitamins-Minerals (CENTRUM SILVER ADULT 50+) TABS Take  1 tablet by mouth daily.  . sertraline (ZOLOFT) 25 MG tablet Take 25 mg by mouth daily.  . simvastatin (ZOCOR) 20 MG tablet Take 20 mg by mouth daily.  Marland Kitchen triamcinolone cream (KENALOG) 0.1 % Apply 1 application topically 2 (two) times daily.  . [DISCONTINUED] methocarbamol (ROBAXIN-750) 750 MG tablet Take 1 tablet (750 mg total) by mouth 4 (four) times daily.  . [DISCONTINUED] predniSONE (DELTASONE) 10 MG tablet 6-5-4-3-2-1 po pc for nerve pain  . [DISCONTINUED] simvastatin (ZOCOR) 40 MG tablet Take 0.5 tablets by mouth at bedtime.   :  Review of Systems:  Out of a complete 14 point review of systems, all are reviewed and negative with the exception of these symptoms as listed below:   Review of Systems  Neurological:       Feel uncomfortable with machine, mark every morning on cheek after using machine, problems with taking off dentures(very dry air inside mouth).hurts on upper mouth, very tight.    Objective:  Neurologic Exam  Physical Exam Physical Examination:   Filed Vitals:   08/25/14 1401  Temp: 98.3 F (36.8 C)    General Examination: The patient is a very pleasant 74 y.o. female in no acute distress. She appears well-developed and well-nourished and well groomed. She is not obese.   HEENT: Normocephalic, atraumatic, pupils are equal, round and reactive to light and accommodation. She has cataracts. Funduscopic exam is normal with sharp disc margins noted. Extraocular tracking is good without limitation to gaze excursion or nystagmus noted. Normal smooth pursuit is noted. Hearing is grossly intact. Face is symmetric with normal facial animation and normal facial sensation. Speech is clear with no dysarthria noted. There is no hypophonia. There is no lip, neck/head, jaw or voice tremor. Neck is supple with full range of passive and active motion. There are no carotid bruits on auscultation. Oropharynx exam reveals: mild mouth dryness, good dental hygiene and mild airway crowding,  due to narrow airway and floppy soft palate. Mallampati is class II. Tongue protrudes centrally and palate elevates symmetrically. Tonsils are 1+ in size/absent.   Chest: Clear to auscultation without wheezing, rhonchi  or crackles noted.  Heart: S1+S2+0, regular and normal without murmurs, rubs or gallops noted.   Abdomen: Soft, non-tender and non-distended with normal bowel sounds appreciated on auscultation.  Extremities: There is no pitting edema in the distal lower extremities bilaterally. Pedal pulses are intact.  Skin: Warm and dry without trophic changes noted. There are no varicose veins.  Musculoskeletal: exam reveals no obvious joint deformities, tenderness or joint swelling or erythema.   Neurologically:  Mental status: The patient is awake, alert and oriented in all 4 spheres. Her memory, attention, language and knowledge are appropriate. There is no aphasia, agnosia, apraxia or anomia. Speech is clear with normal prosody and enunciation. Thought process is linear. Mood is congruent and affect is normal.  Cranial nerves are as described above under HEENT exam. In addition, shoulder shrug is normal with equal shoulder height noted. Motor exam: Normal bulk, strength and tone is noted. There is no drift, tremor or rebound. Romberg is negative. Reflexes are 2+ throughout. Fine motor skills are intact with normal finger taps, normal hand movements, normal rapid alternating patting, normal foot taps and normal foot agility.  Cerebellar testing shows no dysmetria or intention tremor on finger to nose testing. Heel to shin is unremarkable bilaterally. There is no truncal or gait ataxia.  Sensory exam is intact to light touch, pinprick, vibration, temperature sense in the upper and lower extremities.  Gait, station and balance are unremarkable. No veering to one side is noted. No leaning to one side is noted. Posture is age-appropriate and stance is narrow based. No problems turning are noted.  She turns en bloc. Tandem walk is unremarkable for age.   Assessment and Plan:   In summary, ELLIANNAH WAYMENT is a very pleasant 74 year old female with an underlying medical history of breast cancer on the right, status post lumpectomy, chemotherapy and radiation therapy, hypertension, osteopenia, hypertension, and eczema, who has presents for follow-up consultation of her obstructive sleep apnea after her recent sleep studies. Today, I talked to her about her to sleep study results from August 2015 and October 2015 respectively, her compliance data and I commended her for trying to make a good effort to use CPAP therapy regularly. She has indeed done better in the last 30 days then when she first started treatment. She still struggling to tolerate the mask and the pressure. She complains of mouth dryness and pain from the straps of her nasal mask. Her physical exam was stable. I asked her to continue to try using CPAP regularly and not skip any nights. I would like to see if we can help her with tolerance by reducing her pressure some to 13 cm and trying a nasal pillows mask which I prescribed today. She is also encouraged to meet with her DME provider for additional education and mask fitting.  I again had a long chat with the patient about my findings and the diagnosis, its prognosis and treatment options. We talked about medical treatments and non-pharmacological approaches. I explained in particular the risks and ramifications of untreated moderate to severe OSA, especially with respect to developing cardiovascular disease down the Road, including congestive heart failure, difficult to treat hypertension, cardiac arrhythmias, or stroke. Even type 2 diabetes has in part been linked to untreated OSA. We talked about trying to maintain a healthy lifestyle in general, as well as the importance of weight control. I encouraged the patient to eat healthy, exercise daily and keep well hydrated, to keep a scheduled  bedtime and  wake time routine, to not skip any meals and eat healthy snacks in between meals. I explained the importance of being compliant with PAP treatment, not only for insurance purposes but primarily to improve Her symptoms, and for the patient's long term health benefit, including to reduce Her cardiovascular risks. I would like to see her back in 3 months, sooner if need be. I answered all her questions today and she was in agreement. She is encouraged to call with any interim questions or concerns.

## 2014-08-25 NOTE — Patient Instructions (Signed)
Please continue using your CPAP regularly. While your insurance requires that you use CPAP at least 4 hours each night on 70% of the nights, I recommend, that you not skip any nights and use it throughout the night if you can. Getting used to CPAP and staying with the treatment long term does take time and patience and discipline. Untreated obstructive sleep apnea when it is moderate to severe can have an adverse impact on cardiovascular health and raise her risk for heart disease, arrhythmias, hypertension, congestive heart failure, stroke and diabetes. Untreated obstructive sleep apnea causes sleep disruption, nonrestorative sleep, and sleep deprivation. This can have an impact on your day to day functioning and cause daytime sleepiness and impairment of cognitive function, memory loss, mood disturbance, and problems focussing. Using CPAP regularly can improve these symptoms.  We will try a nasal pillows mask and go down on the pressure some to help you tolerate CPAP better.

## 2014-08-28 ENCOUNTER — Ambulatory Visit: Payer: Self-pay | Admitting: Cardiology

## 2014-09-28 ENCOUNTER — Ambulatory Visit (INDEPENDENT_AMBULATORY_CARE_PROVIDER_SITE_OTHER): Payer: Medicare Other | Admitting: Cardiology

## 2014-09-28 ENCOUNTER — Encounter: Payer: Self-pay | Admitting: Cardiology

## 2014-09-28 VITALS — BP 126/82 | HR 77 | Ht <= 58 in | Wt 120.0 lb

## 2014-09-28 DIAGNOSIS — R079 Chest pain, unspecified: Secondary | ICD-10-CM

## 2014-09-28 DIAGNOSIS — E785 Hyperlipidemia, unspecified: Secondary | ICD-10-CM

## 2014-09-28 DIAGNOSIS — R7303 Prediabetes: Secondary | ICD-10-CM

## 2014-09-28 DIAGNOSIS — R7309 Other abnormal glucose: Secondary | ICD-10-CM
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
## 2014-09-28 NOTE — Patient Instructions (Signed)
The current medical regimen is effective;  continue present plan and medications.  Your physician has requested that you have a lexiscan myoview. For further information please visit [email or web address]. Please follow instruction sheet, as given.  Follow up will be based on these results.  Thank you for choosing [facility]!!

## 2014-10-08 ENCOUNTER — Ambulatory Visit (HOSPITAL_COMMUNITY): Payer: Medicare Other | Attending: Cardiovascular Disease | Admitting: Radiology

## 2014-10-08 DIAGNOSIS — R079 Chest pain, unspecified: Secondary | ICD-10-CM | POA: Insufficient documentation

## 2014-10-08 MED ORDER — TECHNETIUM TC 99M SESTAMIBI GENERIC - CARDIOLITE
11.0000 | Freq: Once | INTRAVENOUS | Status: AC | PRN
  Administered 2014-10-08: 11 via INTRAVENOUS

## 2014-10-08 MED ORDER — TECHNETIUM TC 99M SESTAMIBI GENERIC - CARDIOLITE
30.0000 | Freq: Once | INTRAVENOUS | Status: AC | PRN
  Administered 2014-10-08: 30 via INTRAVENOUS

## 2014-10-08 NOTE — Progress Notes (Signed)
[facility] [address] [place], [place] [address] [phone]    Cardiology Nuclear Med Study  Karen [name] is a 74 y.o. female     MRN : [id number]     DOB: [birth year]  Procedure Date: 10/08/2014  Nuclear Med Background Indication for Stress Test:  Evaluation for Ischemia History:  n/a Cardiac Risk Factors: Hypertension and NIDDM, Chemotherapy and Radiation 2nd to Breast CA  Symptoms:  Chest Heaviness   Nuclear Pre-Procedure Caffeine/Decaff Intake:  None> 12 hrs NPO After: 8:00pm   Lungs:  clear O2 Sat: 95% on room air. IV 0.9% NS with Angio Cath:  22g  IV Site: L Antecubital x 1, tolerated well IV Started by:  Irven Baltimore, RN  Chest Size (in):  38 Cup Size: A, (R) Mastectomy  Height: 4\' 11"  (1.499 m)  Weight:  118 lb (53.524 kg)  BMI:  Body mass index is 23.82 kg/(m^2). Tech Comments:  Toprol held x 24 hrs. No medication (Glucophage) this am. Irven Baltimore, RN.    Nuclear Med Study 1 or 2 day study: 1 day  Stress Test Type:  Stress  Reading MD: N/A  Order Authorizing Provider:  Candee Furbish, MD  Resting Radionuclide: Technetium 74m Sestamibi  Resting Radionuclide Dose: 11.0 mCi   Stress Radionuclide:  Technetium 10m Sestamibi  Stress Radionuclide Dose: 33.0 mCi           Stress Protocol Rest HR: 60 Stress HR: 134  Rest BP: 153/69 Stress BP: 156/70  Exercise Time (min): 6:45 METS: 7.30   Predicted Max HR: 147 bpm % Max HR: 91.16 bpm Rate Pressure Product: 20904   Dose of Adenosine (mg):  n/a Dose of Lexiscan: n/a mg  Dose of Atropine (mg): n/a Dose of Dobutamine: n/a mcg/kg/min (at max HR)  Stress Test Technologist: Perrin Maltese, EMT-P  Nuclear Technologist:  Earl Many, CNMT     Rest Procedure:  Myocardial perfusion imaging was performed at rest 45 minutes following the intravenous administration of Technetium 83m Sestamibi. Rest ECG: NSR - Normal EKG  Stress Procedure:  The patient exercised on the treadmill  utilizing the Bruce Protocol for 6:45 minutes. The patient stopped due to fatigue, sob, and denied any chest tightness.  Technetium 60m Sestamibi was injected at peak exercise and myocardial perfusion imaging was performed after a brief delay. Stress ECG: No significant ST segment change suggestive of ischemia. and rate related RBBB  QPS Raw Data Images:  Normal; no motion artifact; normal heart/lung ratio. Stress Images:  There is decreased uptake in the lateral wall.  The defect is small and mild (SSS 4) Rest Images:  There is improvement in the basal inferolateral defect Subtraction (SDS):  These findings are consistent with ischemia. Transient Ischemic Dilatation (Normal <1.22):  0.94 Lung/Heart Ratio (Normal <0.45):  0.28  Quantitative Gated Spect Images QGS EDV:  76 ml QGS ESV:  24 ml  Impression Exercise Capacity:  Good exercise capacity. BP Response:  Normal blood pressure response. Clinical Symptoms:  Typical chest pain. ECG Impression:  No significant ST segment change suggestive of ischemia. Comparison with Prior Nuclear Study: No previous nuclear study performed  Overall Impression:  Low risk stress nuclear study with basal inferolateral ischemia and exercise induced chest pain relieved by rest. The defect is small and mild in severity..  LV Ejection Fraction: 69%.  LV Wall Motion:  NL LV Function; NL Wall Motion   Sanda Klein, MD, [facility] [phone] office [phone] pager

## 2014-11-23 ENCOUNTER — Ambulatory Visit: Payer: Medicare Other | Admitting: Neurology

## 2014-11-23 ENCOUNTER — Telehealth: Payer: Self-pay

## 2014-11-23 NOTE — Telephone Encounter (Signed)
Patient did not show to appt today  

## 2014-11-24 ENCOUNTER — Encounter: Payer: Self-pay | Admitting: Neurology

## 2014-11-26 ENCOUNTER — Other Ambulatory Visit: Payer: Self-pay | Admitting: Radiology

## 2014-12-09 ENCOUNTER — Encounter: Payer: Self-pay | Admitting: Neurology

## 2014-12-09 ENCOUNTER — Ambulatory Visit (INDEPENDENT_AMBULATORY_CARE_PROVIDER_SITE_OTHER): Payer: Medicare Other | Admitting: Neurology

## 2014-12-09 VITALS — BP 128/67 | HR 63 | Ht <= 58 in | Wt 120.0 lb

## 2014-12-09 DIAGNOSIS — Z658 Other specified problems related to psychosocial circumstances: Secondary | ICD-10-CM | POA: Diagnosis not present

## 2014-12-09 DIAGNOSIS — R419 Unspecified symptoms and signs involving cognitive functions and awareness: Secondary | ICD-10-CM

## 2014-12-09 DIAGNOSIS — F439 Reaction to severe stress, unspecified: Secondary | ICD-10-CM

## 2014-12-09 DIAGNOSIS — Z9989 Dependence on other enabling machines and devices: Secondary | ICD-10-CM

## 2014-12-09 DIAGNOSIS — G4733 Obstructive sleep apnea (adult) (pediatric): Secondary | ICD-10-CM | POA: Diagnosis not present

## 2014-12-09 NOTE — Progress Notes (Signed)
Subjective:    Karen [name] ID: Karen Karen [name] is a 74 y.o. female.  HPI     Interim history:   Karen Karen [name] is a very pleasant 74 year old right-handed woman with an underlying medical history of breast cancer on Karen right, status post lumpectomy, chemotherapy and radiation therapy, hypertension, osteopenia, hypertension, and eczema, who has presents for follow-up consultation of her obstructive sleep apnea, on treatment with CPAP. Karen Karen [name] is unaccompanied today. I last saw her on 08/25/2014, at which time she reported trouble tolerating Karen mask and Karen pressure. She was waking up with dry mouth. Karen strap of Karen mask was leaving marks on her face. She had gone to her DME representative because of her severe mouth dryness and was advised to increase Karen humidity, but then she had too much moisture. All in all, she was struggling. However, she did endorse sleeping better compared to before her sleep apnea diagnosis. She had had some medication changes including being on sertraline and low-dose metformin. I suggested we reduced Karen pressure some for better tolerance and a reduced at 13.  Of note, Karen Karen [name] no showed for an appointment on 11/23/2014.  Today, 12/09/2014: I reviewed her CPAP compliance data from 11/08/2014 through 12/07/2014 which is a total of 30 days during which time she used her machine 26 days with percent used days greater than 4 hours at 77%, indicating adequate are good compliance, average usage of 4 hours and 53 minutes for all nights, residual AHI low at 0.1 per hour, leak acceptable with occasional increase in Karen 95th percentile of leak at 25.4 L/m on a pressure of 13 with EPR of 3.  Today, 12/09/2014: She reports, that she had to skip nights d/t her husband's illness and being in Karen hospital with him. Overall she has done much better than in Karen past with her CPAP. She feels better. She had needle Bx on Karen L breast. She has support from her 4 kids. She c/o some memory loss,  mostly forgetfulness.   Previously:  I first met her on 05/05/2013 at Karen request of her primary care physician, at which time she complained of nonrestorative sleep, sleep disruption, snoring, and daytime somnolence. I invited her back for sleep study. She had a baseline sleep study on 03/08/2014 followed by a CPAP titration study on 05/03/2014. Her baseline sleep efficiency was 84.5% with a prolonged sleep latency of 29.5 minutes and wake after sleep onset of 39.5 minutes with mild to moderate sleep fragmentation noted. She had an increased percentage of slow-wave sleep and a normal percentage of REM sleep with a mildly reduced REM latency. She had mild PLMS with minimal arousals. She had mild intermittent snoring. Total AHI was 14.8 per hour, rising to 48.4 per hour during REM sleep. Average oxygen saturation was 92% with a nadir of 76% in REM sleep. Time below 88% saturation was 20 minutes. Based on these test results I asked her to return for a full night CPAP titration study which she had on 05/03/2014. Sleep efficiency was 90.5%. Sleep latency was normal. Wake after sleep onset was 36.5 minutes with mild to moderate sleep fragmentation noted. She had moderate PLMS with mild arousals. She had slow-wave sleep at 12.7% and REM sleep at 18.7% with a slightly reduced REM latency. She had no significant EKG or EEG changes. She was titrated from 5-15 cm and also tried on BiPAP of 16/12 cm to 18/14 cm. She did reasonably well on CPAP of 14 cm. Brief supine REM  sleep was achieved. AHI was 0 per hour on Karen pressure of 14 cm. Based on those test results I prescribed CPAP therapy.  I reviewed her compliance data from 06/11/2014 through 06/22/2014 which is a total of 12 days during which time she used her machine 7 days with percent used days greater than 4 hours of 8%, indicating poor compliance. AHI at 0.1 per hour and 95th percentile of leak at 12.2 L/m, adequate. Pressure at 14 cm with EPR of 3. Average usage  of only 1 hour and 26 minutes for all days and 2 hours and 27 minutes 4 days on machine.  I reviewed her compliance data on 06/20/2014 through 07/19/2014 which is a total of 30 days during which time she used her machine only 5 days with percent used days greater than 4 hours of only 3%, indicating noncompliance, residual AHI at 0.4 per hour and leak acceptable at Karen same pressure.  I reviewed her compliance data from 07/22/2014 through 08/20/2014 which is a total of 30 days during which time she used her machine 27 days with percent used days greater than 4 hours of 50%, indicating much improved compliance but still suboptimal with an average usage of 3 hours and 33 minutes and residual AHI low at 0.5 per hour and leak acceptable at 15.1 L/m at Karen 95th percentile. Karen Karen [name] called in December 2015, reporting that she was not using her CPAP machine and wanted to return it. She was advised to come in for follow-up appointment.   Her typical bedtime is reported to be around 9 PM and usual wake time is around 6 AM. Sleep onset typically occurs within 30-60 minutes. She reports feeling marginally rested upon awakening. She wakes up on an average 1 times in Karen middle of Karen night and has to go to Karen bathroom rarely in a typical night. She reports no morning headaches.   She reports occasional excessive daytime somnolence (EDS) and Her Epworth Sleepiness Score (ESS) is 6/24 today. She has not fallen asleep while driving. Karen Karen [name] has been taking a scheduled nap, which is usually after lunch and 1 hour to 1.5 hours long. She denies dreaming in a nap and reports feeling refreshed after a nap. She has been known to snore for Karen past many years. Snoring is reportedly moderate, but not associated with choking sounds and witnessed apneas. Karen Karen [name] admits to a rare sense of choking or strangling feeling. There is no report of nighttime reflux, with no nighttime cough experienced. Karen Karen [name] has not noted  any RLS symptoms and is not known to kick while asleep or before falling asleep. There is no family history of RLS or OSA.   She is a restless sleeper and in Karen morning, Karen bed is quite disheveled.    She denies cataplexy, sleep paralysis, hypnagogic or hypnopompic hallucinations, or sleep attacks. She does not report any vivid dreams, nightmares, dream enactments, or parasomnias, such as sleep talking or sleep walking. Karen Karen [name] has not had a sleep study or a home sleep test.   She consumes 0 caffeinated beverages per day, usually in Karen form of decaff coffee in Karen morning.    Her bedroom is usually dark and cool. There is a TV in Karen bedroom and usually it is on at night.    Her Past Medical History Is Significant For: Past Medical History  Diagnosis Date  . HTN (hypertension)   . Osteopenia   . Malignant neoplasm of breast (female), unspecified  site   . DCIS (ductal carcinoma in situ)   . Anxiety   . High cholesterol   . Arthritis     "hands"    Her Past Surgical History Is Significant For: Past Surgical History  Procedure Laterality Date  . Combined laparoscopy w/ hysteroscopy    . Mastectomy Right 02/2014  . Tubal ligation    . Breast lumpectomy Right 1990's  . Total mastectomy Right 02/09/2014    Procedure: RIGHT TOTAL MASTECTOMY;  Surgeon: Ernestene Mention, MD;  Location: [facility] OR;  Service: General;  Laterality: Right;  . Cataract extraction  07/07/14    left eye    Her Family History Is Significant For: Family History  Problem Relation Age of Onset  . Hypertension Father   . CVA Father   . Diabetes Mother   . Heart disease Mother   . Breast cancer Paternal Aunt     dx 62s; deceased 83s  . Breast cancer Paternal Aunt     dx late 51s; deceased 26  . Leukemia Cousin     2 paternal female cousins  . Diabetes Brother   . CVA Brother     Her Social History Is Significant For: History   Social History  . Marital Status: Married    Spouse Name: N/A  . Number of  Children: N/A  . Years of Education: N/A   Occupational History  . retired Arts administrator    Professor   Social History Main Topics  . Smoking status: Former Smoker -- 0.50 packs/day for 30 years    Types: Cigarettes    Quit date: 05/06/1991  . Smokeless tobacco: Never Used  . Alcohol Use: No  . Drug Use: No  . Sexual Activity: Not on file   Other Topics Concern  . None   Social History Narrative    Her Allergies Are:  No Known Allergies:   Her Current Medications Are:  Outpatient Encounter Prescriptions as of 12/09/2014  Medication Sig  . ALPRAZolam (XANAX) 0.5 MG tablet Take 0.5 tablets by mouth every 8 (eight) hours as needed (for severe anxiety).   Marland Kitchen amLODipine-valsartan (EXFORGE) 5-160 MG per tablet Take 1 tablet by mouth daily.  . metFORMIN (GLUCOPHAGE-XR) 500 MG 24 hr tablet Take 500 mg by mouth daily with breakfast.   . metoprolol tartrate (LOPRESSOR) 25 MG tablet Take 25 mg by mouth 2 (two) times daily.   . Multiple Vitamins-Minerals (CENTRUM SILVER ADULT 50+) TABS Take 1 tablet by mouth daily.  Marland Kitchen ofloxacin (OCUFLOX) 0.3 % ophthalmic solution   . sertraline (ZOLOFT) 25 MG tablet Take 50 mg by mouth daily.   . simvastatin (ZOCOR) 20 MG tablet Take 20 mg by mouth daily.  Marland Kitchen triamcinolone cream (KENALOG) 0.1 % Apply 1 application topically 2 (two) times daily.  . [DISCONTINUED] calcium-vitamin D (OSCAL) 250-125 MG-UNIT per tablet Take 1 tablet by mouth daily.  . [DISCONTINUED] fluocinonide cream (LIDEX) 0.05 % Apply 1 application topically 2 (two) times daily.  . [DISCONTINUED] ketoconazole (NIZORAL) 2 % cream Apply 1 application topically daily.  . [DISCONTINUED] ketorolac (ACULAR) 0.5 % ophthalmic solution   . [DISCONTINUED] mometasone (ELOCON) 0.1 % cream Apply 1 application topically daily.  . [DISCONTINUED] prednisoLONE acetate (PRED FORTE) 1 % ophthalmic suspension    No facility-administered encounter medications on file as of 12/09/2014.  :  Review of Systems:  Out of a  complete 14 point review of systems, all are reviewed and negative with Karen exception of these symptoms as listed below:  Review of Systems  Neurological:       Memory   Psychiatric/Behavioral: Positive for sleep disturbance.    Objective:  Neurologic Exam  Physical Exam Physical Examination:   Filed Vitals:   12/09/14 1304  BP: 128/67  Pulse: 63   General Examination: Karen Karen [name] is a very pleasant 74 y.o. female in no acute distress. She appears well-developed and well-nourished and well groomed. She is not obese.   HEENT: Normocephalic, atraumatic, pupils are equal, round and reactive to light and accommodation. She has had cataract repairs. Funduscopic exam is normal with sharp disc margins noted. Extraocular tracking is good without limitation to gaze excursion or nystagmus noted. Normal smooth pursuit is noted. Hearing is grossly intact. Face is symmetric with normal facial animation and normal facial sensation. Speech is clear with no dysarthria noted. There is no hypophonia. There is no lip, neck/head, jaw or voice tremor. Neck is supple with full range of passive and active motion. There are no carotid bruits on auscultation. Oropharynx exam reveals: mild mouth dryness, good dental hygiene and mild airway crowding, due to narrow airway and floppy soft palate. Mallampati is class II. Tongue protrudes centrally and palate elevates symmetrically. Tonsils are 1+ in size/absent.   Chest: Clear to auscultation without wheezing, rhonchi or crackles noted.  Heart: S1+S2+0, regular and normal without murmurs, rubs or gallops noted.   Abdomen: Soft, non-tender and non-distended with normal bowel sounds appreciated on auscultation.  Extremities: There is no pitting edema in Karen distal lower extremities bilaterally. Pedal pulses are intact.  Skin: Warm and dry without trophic changes noted. There are no varicose veins.  Musculoskeletal: exam reveals no obvious joint deformities,  tenderness or joint swelling or erythema.   Neurologically:  Mental status: Karen Karen [name] is awake, alert and oriented in all 4 spheres. Her memory, attention, language and knowledge are appropriate. There is no aphasia, agnosia, apraxia or anomia. Speech is clear with normal prosody and enunciation. Thought process is linear. Mood is congruent and affect is normal.  On 12/09/2014: MMSE: 29/30, AFT: 9/min.   Cranial nerves are as described above under HEENT exam. In addition, shoulder shrug is normal with equal shoulder height noted. Motor exam: Normal bulk, strength and tone is noted. There is no drift, tremor or rebound. Romberg is negative. Reflexes are 2+ throughout. Fine motor skills are intact with normal finger taps, normal hand movements, normal rapid alternating patting, normal foot taps and normal foot agility.  Cerebellar testing shows no dysmetria or intention tremor on finger to nose testing. Heel to shin is unremarkable bilaterally. There is no truncal or gait ataxia.  Sensory exam is intact to light touch, pinprick, vibration, temperature sense in Karen upper and lower extremities.  Gait, station and balance are unremarkable. No veering to one side is noted. No leaning to one side is noted. Posture is age-appropriate and stance is narrow based. No problems turning are noted. She turns en bloc. Tandem walk is unremarkable for age.   Assessment and Plan:   In summary, ETHAL GOTAY is a very pleasant 74 year old female with an underlying medical history of breast cancer on Karen right, status post lumpectomy, chemotherapy and radiation therapy, hypertension, osteopenia, hypertension, and eczema, who has presents for follow-up consultation of her obstructive sleep apnea. Her compliance has improved. She has had more stress lately with her husband's chronic illness. He is 44 years older than her. Thankfully she has good support from her 4 children who are all local or intractable  distance. She has  done better with her CPAP compliance and actually feels better with her sleep but has had some memory issues lately including primarily forgetfulness. She is reassured that her memory scores are not bad. This could be in keeping with age appropriate memory loss and stress may be a contributor. We will continue to monitor. We talked about her recent compliance data and she is congratulated on her improvements with her CPAP compliance, she had to skip a few days because of her husband's illness. She is advised to continue with her CPAP usage and we will see her routinely in about 6 months at which time we will also talk about her memory again. She had some recent blood work with her primary care physician and is due for a checkup with him soon. Her exam is stable. She is reassured in that regard as well. I answered all her questions today and she was in agreement.   I spent 15 minutes in total face-to-face time with Karen Karen [name], more than 50% of which was spent in counseling and coordination of care, reviewing test results, reviewing medication and discussing or reviewing Karen diagnosis of OSA and memory loss, Karen prognosis and treatment options.

## 2014-12-09 NOTE — Patient Instructions (Signed)
Please continue using your CPAP regularly. While your insurance requires that you use CPAP at least 4 hours each night on 70% of the nights, I recommend, that you not skip any nights and use it throughout the night if you can. Getting used to CPAP and staying with the treatment long term does take time and patience and discipline. Untreated obstructive sleep apnea when it is moderate to severe can have an adverse impact on cardiovascular health and raise her risk for heart disease, arrhythmias, hypertension, congestive heart failure, stroke and diabetes. Untreated obstructive sleep apnea causes sleep disruption, nonrestorative sleep, and sleep deprivation. This can have an impact on your day to day functioning and cause daytime sleepiness and impairment of cognitive function, memory loss, mood disturbance, and problems focussing. Using CPAP regularly can improve these symptoms.  Keep up the good work! I will see you back in 6 months for sleep apnea check up, and we will continue to monitor your memory.

## 2014-12-15 ENCOUNTER — Other Ambulatory Visit: Payer: Self-pay | Admitting: General Surgery

## 2014-12-15 DIAGNOSIS — N6320 Unspecified lump in the left breast, unspecified quadrant: Secondary | ICD-10-CM

## 2014-12-25 ENCOUNTER — Encounter (HOSPITAL_BASED_OUTPATIENT_CLINIC_OR_DEPARTMENT_OTHER): Payer: Self-pay | Admitting: *Deleted

## 2014-12-28 NOTE — H&P (Signed)
Karen [name]. [facility]  Location: [facility] Patient #: [id number] DOB: [birth year] Married / Language: English / Race: Asian Female             History of Present Illness  The patient is a 74 year old female who presents with a complaint of complex sclerosing lesion left breast. This is a very pleasant 74 year old woman, prior patient of mine, referred back to me from radiology for evaluation and management of a complex sclerosing lesion of the left breast at the 11 o'clock position.  Past history is significant for a right partial mastectomy and axillary lymph node dissection in 1995 for which she received radiation therapy chemotherapy and tamoxifen.    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 She underwent a right total mastectomy on February 09, 2014 and recovered uneventfully without any chronic pain or arm swelling. Her last office visit was just October 28, 2014. She was due for mammograms.     Screening mammogram of the left breast showed possible new architectural distortion on the left, middle depth central to the nipple. Subsequent imaging showed a 1 cm density in the left breast 11 o'clock position 2 cm from the nipple. There is got a biopsy shows high risk benign complex sclerosing lesion, possible radial scar. She was appropriately referred for surgical biopsy.     Comorbidities include past history of right breast cancer, hypertension, type 2 diabetes, anxiety, hyperlipidemia. She is followed for all of these conditions by Dr. Leanna Battles of [facility] medical.   Allergies  No Known Drug Allergies09/17/2015  Medication History MetFORMIN HCl (500MG  Tablet, Oral) Active. Sertraline HCl (25MG  Tablet, Oral) Active. Calcium-Vitamin D (250-125MG -UNIT Tablet, Oral) Active. Lidex (0.05% Ointment, External) Active. Nizoral (2% Cream, External) Active. Mometasone Furoate (0.1% Cream, External)  Active. Multivitamins (Oral) Active. ALPRAZolam (0.5MG  Tablet, Oral as needed) Active. Amlodipine Besylate-Valsartan (5-160MG  Tablet, Oral daily) Active. Metoprolol Tartrate (25MG  Tablet, Oral daily) Active. Simvastatin (40MG  Tablet, Oral daily) Active. Medications Reconciled  Vitals   Weight: 118 lb Height: 57in Body Surface Area: 1.47 m Body Mass Index: 25.53 kg/m Temp.: 97.66F(Oral)  Pulse: 63 (Regular)  Resp.: 17 (Unlabored)  BP: 122/60 (Sitting, Left Arm, Standard)    Physical Exam  General Note: She is anxious but cooperative. Here with her daughter-in-law and granddaughter. No physical distress. Obviously fearful.   Head and Neck Note: No adenopathy or mass. Good range of motion.   Chest and Lung Exam Note: Clear to auscultation bilaterally. No chest wall tenderness   Breast Note: Left breast reveals biopsy scar upper inner quadrant, needle puncture type. Small amount of ecchymoses. No significant hematoma. No dominant mass. Right mastectomy incision well-healed. No nodules or ulceration. No axillary adenopathy on either side. Excellent range of motion of shoulders. No arm swelling. No arm numbness.   Cardiovascular Note: Regular rate and rhythm. No murmur. No ectopy.   Neurologic Note: Gait normal. No gross motor or sensory deficits. Oriented 4.   Neuropsychiatric Note: Alert and oriented 4. Somewhat fearful of her diagnosis, appropriate.     Assessment & Plan   LEFT BREAST MASS (611.72  N63)   Schedule for Surgery Your mammograms and recent left breast biopsy show a small density in the upper inner quadrant of the left breast, and the biopsy shows a complex sclerosing lesion. There is a small risk that this could contain early cancer, and that risk is increased because of your past history of cancer of the right breast.  You'll be scheduled for a right breast lumpectomy with radioactive seed localization  We have  discussed the techniques and risks of this surgery in detail Please read the written information that we gave you. Pt Education - Lumpectomy and Axillary Lymph Node Dissection: biopsy  HISTORY OF RIGHT BREAST CANCER (V10.3  Z85.3)  HYPERTENSION, BENIGN (401.1  I10) Impression: Takes amlodipine and valsartan, Lopressor  CONTROLLED TYPE 2 DIABETES MELLITUS WITHOUT COMPLICATION, WITHOUT LONG-TERM CURRENT USE OF INSULIN (250.00  E11.9) Impression: Takes metformin ANXIETY AND DEPRESSION (300.00  F41.9) Impression: Takes Xanax and Zoloft HYPERLIPIDEMIA, ACQUIRED (272.4  E78.5) Impression: Takes simvastatin    Edsel Petrin. Dalbert Batman, M.D., [facility] General and Minimally invasive Surgery Breast and Colorectal Surgery Office:   [phone] Pager:   [phone]

## 2014-12-29 ENCOUNTER — Ambulatory Visit (HOSPITAL_BASED_OUTPATIENT_CLINIC_OR_DEPARTMENT_OTHER)
Admission: RE | Admit: 2014-12-29 | Discharge: 2014-12-29 | Disposition: A | Discharge status: Home / Self Care | Payer: Medicare Other | Source: Ambulatory Visit | Attending: General Surgery | Admitting: General Surgery

## 2014-12-29 ENCOUNTER — Encounter (HOSPITAL_BASED_OUTPATIENT_CLINIC_OR_DEPARTMENT_OTHER): Payer: Self-pay | Admitting: Anesthesiology

## 2014-12-29 ENCOUNTER — Ambulatory Visit (HOSPITAL_BASED_OUTPATIENT_CLINIC_OR_DEPARTMENT_OTHER): Payer: Medicare Other | Admitting: Anesthesiology

## 2014-12-29 ENCOUNTER — Encounter (HOSPITAL_BASED_OUTPATIENT_CLINIC_OR_DEPARTMENT_OTHER)
Admission: RE | Disposition: A | Discharge status: Home / Self Care | Payer: Self-pay | Source: Ambulatory Visit | Attending: General Surgery

## 2014-12-29 DIAGNOSIS — N649 Disorder of breast, unspecified: Secondary | ICD-10-CM | POA: Diagnosis present

## 2014-12-29 DIAGNOSIS — N6012 Diffuse cystic mastopathy of left breast: Secondary | ICD-10-CM | POA: Insufficient documentation

## 2014-12-29 DIAGNOSIS — E119 Type 2 diabetes mellitus without complications: Secondary | ICD-10-CM | POA: Diagnosis not present

## 2014-12-29 DIAGNOSIS — M1990 Unspecified osteoarthritis, unspecified site: Secondary | ICD-10-CM | POA: Diagnosis not present

## 2014-12-29 DIAGNOSIS — Z853 Personal history of malignant neoplasm of breast: Secondary | ICD-10-CM | POA: Insufficient documentation

## 2014-12-29 DIAGNOSIS — I10 Essential (primary) hypertension: Secondary | ICD-10-CM | POA: Diagnosis not present

## 2014-12-29 DIAGNOSIS — N6022 Fibroadenosis of left breast: Secondary | ICD-10-CM | POA: Insufficient documentation

## 2014-12-29 DIAGNOSIS — Z87891 Personal history of nicotine dependence: Secondary | ICD-10-CM | POA: Diagnosis not present

## 2014-12-29 DIAGNOSIS — G4730 Sleep apnea, unspecified: Secondary | ICD-10-CM | POA: Diagnosis not present

## 2014-12-29 DIAGNOSIS — F419 Anxiety disorder, unspecified: Secondary | ICD-10-CM | POA: Insufficient documentation

## 2014-12-29 DIAGNOSIS — Z9011 Acquired absence of right breast and nipple: Secondary | ICD-10-CM | POA: Diagnosis not present

## 2014-12-29 DIAGNOSIS — E785 Hyperlipidemia, unspecified: Secondary | ICD-10-CM | POA: Insufficient documentation

## 2014-12-29 DIAGNOSIS — R928 Other abnormal and inconclusive findings on diagnostic imaging of breast: Secondary | ICD-10-CM

## 2014-12-29 HISTORY — DX: Sleep apnea, unspecified: G47.30

## 2014-12-29 HISTORY — DX: Type 2 diabetes mellitus without complications: E11.9

## 2014-12-29 HISTORY — PX: BREAST LUMPECTOMY WITH RADIOACTIVE SEED LOCALIZATION: SHX6424

## 2014-12-29 LAB — CBC WITH DIFFERENTIAL/PLATELET
Basophils Absolute: 0 10*3/uL (ref 0.0–0.1)
Basophils Relative: 1 % (ref 0–1)
Eosinophils Absolute: 0.2 10*3/uL (ref 0.0–0.7)
Eosinophils Relative: 4 % (ref 0–5)
HCT: 38.7 % (ref 36.0–46.0)
Hemoglobin: 13 g/dL (ref 12.0–15.0)
LYMPHS ABS: 1.6 10*3/uL (ref 0.7–4.0)
Lymphocytes Relative: 27 % (ref 12–46)
MCH: 29.1 pg (ref 26.0–34.0)
MCHC: 33.6 g/dL (ref 30.0–36.0)
MCV: 86.6 fL (ref 78.0–100.0)
MONO ABS: 0.6 10*3/uL (ref 0.1–1.0)
MONOS PCT: 10 % (ref 3–12)
NEUTROS ABS: 3.5 10*3/uL (ref 1.7–7.7)
Neutrophils Relative %: 58 % (ref 43–77)
Platelets: 179 10*3/uL (ref 150–400)
RBC: 4.47 MIL/uL (ref 3.87–5.11)
RDW: 13.3 % (ref 11.5–15.5)
WBC: 6 10*3/uL (ref 4.0–10.5)

## 2014-12-29 LAB — POCT I-STAT, CHEM 8
BUN: 22 mg/dL — AB (ref 6–20)
Calcium, Ion: 1.19 mmol/L (ref 1.13–1.30)
Chloride: 101 mmol/L (ref 101–111)
Creatinine, Ser: 0.9 mg/dL (ref 0.44–1.00)
Glucose, Bld: 102 mg/dL — ABNORMAL HIGH (ref 65–99)
HEMATOCRIT: 43 % (ref 36.0–46.0)
HEMOGLOBIN: 14.6 g/dL (ref 12.0–15.0)
Potassium: 4.2 mmol/L (ref 3.5–5.1)
SODIUM: 141 mmol/L (ref 135–145)
TCO2: 27 mmol/L (ref 0–100)

## 2014-12-29 LAB — GLUCOSE, CAPILLARY: Glucose-Capillary: 82 mg/dL (ref 65–99)

## 2014-12-29 SURGERY — BREAST LUMPECTOMY WITH RADIOACTIVE SEED LOCALIZATION
Anesthesia: General | Site: Breast | Laterality: Left

## 2014-12-29 MED ORDER — MIDAZOLAM HCL 2 MG/2ML IJ SOLN: 1.0000 mg | INTRAMUSCULAR | Status: DC | PRN

## 2014-12-29 MED ORDER — CHLORHEXIDINE GLUCONATE 4 % EX LIQD: 1.0000 "application " | Freq: Once | CUTANEOUS | Status: DC

## 2014-12-29 MED ORDER — PROMETHAZINE HCL 25 MG/ML IJ SOLN: 6.2500 mg | INTRAMUSCULAR | Status: DC | PRN

## 2014-12-29 MED ORDER — LIDOCAINE HCL (CARDIAC) 20 MG/ML IV SOLN
INTRAVENOUS | Status: DC | PRN
  Administered 2014-12-29: 50 mg via INTRAVENOUS

## 2014-12-29 MED ORDER — PROPOFOL 10 MG/ML IV BOLUS
INTRAVENOUS | Status: DC | PRN
  Administered 2014-12-29: 100 mg via INTRAVENOUS

## 2014-12-29 MED ORDER — GLYCOPYRROLATE 0.2 MG/ML IJ SOLN: 0.2000 mg | Freq: Once | INTRAMUSCULAR | Status: DC | PRN

## 2014-12-29 MED ORDER — CEFAZOLIN SODIUM-DEXTROSE 2-3 GM-% IV SOLR
2.0000 g | INTRAVENOUS | Status: AC
Start: 2014-12-29 — End: 2014-12-29
  Administered 2014-12-29: 2 g via INTRAVENOUS

## 2014-12-29 MED ORDER — HYDROCODONE-ACETAMINOPHEN 5-325 MG PO TABS: 1.0000 | ORAL_TABLET | Freq: Four times a day (QID) | ORAL | Status: DC | PRN

## 2014-12-29 MED ORDER — SODIUM CHLORIDE 0.9 % IJ SOLN: 3.0000 mL | Freq: Two times a day (BID) | INTRAMUSCULAR | Status: DC

## 2014-12-29 MED ORDER — MIDAZOLAM HCL 5 MG/5ML IJ SOLN: INTRAMUSCULAR | Status: DC | PRN

## 2014-12-29 MED ORDER — FENTANYL CITRATE (PF) 100 MCG/2ML IJ SOLN
INTRAMUSCULAR | Status: DC | PRN
  Administered 2014-12-29 (×2): 50 ug via INTRAVENOUS

## 2014-12-29 MED ORDER — OXYCODONE HCL 5 MG PO TABS: 5.0000 mg | ORAL_TABLET | ORAL | Status: DC | PRN

## 2014-12-29 MED ORDER — FENTANYL CITRATE (PF) 100 MCG/2ML IJ SOLN
INTRAMUSCULAR | Status: AC
  Filled 2014-12-29: qty 2

## 2014-12-29 MED ORDER — BUPIVACAINE-EPINEPHRINE (PF) 0.5% -1:200000 IJ SOLN
INTRAMUSCULAR | Status: DC | PRN
  Administered 2014-12-29: 10 mL

## 2014-12-29 MED ORDER — FENTANYL CITRATE (PF) 100 MCG/2ML IJ SOLN: 50.0000 ug | INTRAMUSCULAR | Status: DC | PRN

## 2014-12-29 MED ORDER — ACETAMINOPHEN 325 MG PO TABS: 650.0000 mg | ORAL_TABLET | ORAL | Status: DC | PRN

## 2014-12-29 MED ORDER — HYDROCODONE-ACETAMINOPHEN 7.5-325 MG PO TABS: 1.0000 | ORAL_TABLET | Freq: Once | ORAL | Status: DC | PRN

## 2014-12-29 MED ORDER — GLYCOPYRROLATE 0.2 MG/ML IJ SOLN
INTRAMUSCULAR | Status: DC | PRN
  Administered 2014-12-29 (×2): 0.2 mg via INTRAVENOUS

## 2014-12-29 MED ORDER — ACETAMINOPHEN 650 MG RE SUPP: 650.0000 mg | RECTAL | Status: DC | PRN

## 2014-12-29 MED ORDER — SCOPOLAMINE 1 MG/3DAYS TD PT72: 1.0000 | MEDICATED_PATCH | Freq: Once | TRANSDERMAL | Status: DC | PRN

## 2014-12-29 MED ORDER — SODIUM CHLORIDE 0.9 % IJ SOLN: 3.0000 mL | INTRAMUSCULAR | Status: DC | PRN

## 2014-12-29 MED ORDER — SODIUM CHLORIDE 0.9 % IV SOLN: 250.0000 mL | INTRAVENOUS | Status: DC | PRN

## 2014-12-29 MED ORDER — FENTANYL CITRATE (PF) 100 MCG/2ML IJ SOLN
25.0000 ug | INTRAMUSCULAR | Status: DC | PRN
  Administered 2014-12-29 (×2): 50 ug via INTRAVENOUS
  Administered 2014-12-29: 25 ug via INTRAVENOUS

## 2014-12-29 MED ORDER — BUPIVACAINE-EPINEPHRINE (PF) 0.5% -1:200000 IJ SOLN
INTRAMUSCULAR | Status: AC
  Filled 2014-12-29: qty 60

## 2014-12-29 MED ORDER — EPHEDRINE SULFATE 50 MG/ML IJ SOLN
INTRAMUSCULAR | Status: DC | PRN
  Administered 2014-12-29: 10 mg via INTRAVENOUS

## 2014-12-29 MED ORDER — FENTANYL CITRATE (PF) 100 MCG/2ML IJ SOLN: 25.0000 ug | INTRAMUSCULAR | Status: DC | PRN

## 2014-12-29 MED ORDER — SUCCINYLCHOLINE CHLORIDE 20 MG/ML IJ SOLN
INTRAMUSCULAR | Status: AC
  Filled 2014-12-29: qty 3

## 2014-12-29 MED ORDER — SODIUM CHLORIDE 0.9 % IV SOLN: INTRAVENOUS | Status: DC

## 2014-12-29 MED ORDER — FENTANYL CITRATE (PF) 100 MCG/2ML IJ SOLN
INTRAMUSCULAR | Status: AC
  Filled 2014-12-29: qty 6

## 2014-12-29 MED ORDER — MIDAZOLAM HCL 2 MG/2ML IJ SOLN
INTRAMUSCULAR | Status: AC
  Filled 2014-12-29: qty 2

## 2014-12-29 MED ORDER — LACTATED RINGERS IV SOLN
INTRAVENOUS | Status: DC
  Administered 2014-12-29 (×2): via INTRAVENOUS

## 2014-12-29 SURGICAL SUPPLY — 64 items
ADH SKN CLS APL DERMABOND .7 (GAUZE/BANDAGES/DRESSINGS) ×1
APL SKNCLS STERI-STRIP NONHPOA (GAUZE/BANDAGES/DRESSINGS)
APPLIER CLIP 9.375 MED OPEN (MISCELLANEOUS)
APR CLP MED 9.3 20 MLT OPN (MISCELLANEOUS)
BENZOIN TINCTURE PRP APPL 2/3 (GAUZE/BANDAGES/DRESSINGS) IMPLANT
BINDER BREAST LRG (GAUZE/BANDAGES/DRESSINGS) ×2 IMPLANT
BINDER BREAST MEDIUM (GAUZE/BANDAGES/DRESSINGS) IMPLANT
BINDER BREAST XLRG (GAUZE/BANDAGES/DRESSINGS) IMPLANT
BINDER BREAST XXLRG (GAUZE/BANDAGES/DRESSINGS) IMPLANT
BLADE HEX COATED 2.75 (ELECTRODE) ×3 IMPLANT
BLADE SURG 10 STRL SS (BLADE) IMPLANT
BLADE SURG 15 STRL LF DISP TIS (BLADE) ×1 IMPLANT
BLADE SURG 15 STRL SS (BLADE) ×3
CANISTER SUC SOCK COL 7IN (MISCELLANEOUS) IMPLANT
CANISTER SUCT 1200ML W/VALVE (MISCELLANEOUS) ×3 IMPLANT
CHLORAPREP W/TINT 26ML (MISCELLANEOUS) ×3 IMPLANT
CLIP APPLIE 9.375 MED OPEN (MISCELLANEOUS) IMPLANT
CLOSURE WOUND 1/2 X4 (GAUZE/BANDAGES/DRESSINGS)
COVER BACK TABLE 60X90IN (DRAPES) ×3 IMPLANT
COVER MAYO STAND STRL (DRAPES) ×3 IMPLANT
COVER PROBE W GEL 5X96 (DRAPES) ×3 IMPLANT
DECANTER SPIKE VIAL GLASS SM (MISCELLANEOUS) IMPLANT
DERMABOND ADVANCED (GAUZE/BANDAGES/DRESSINGS) ×2
DERMABOND ADVANCED .7 DNX12 (GAUZE/BANDAGES/DRESSINGS) ×1 IMPLANT
DEVICE DUBIN W/COMP PLATE 8390 (MISCELLANEOUS) ×3 IMPLANT
DRAPE LAPAROSCOPIC ABDOMINAL (DRAPES) ×3 IMPLANT
DRAPE UTILITY XL STRL (DRAPES) ×3 IMPLANT
DRSG PAD ABDOMINAL 8X10 ST (GAUZE/BANDAGES/DRESSINGS) IMPLANT
ELECT REM PT RETURN 9FT ADLT (ELECTROSURGICAL) ×3
ELECTRODE REM PT RTRN 9FT ADLT (ELECTROSURGICAL) ×1 IMPLANT
GLOVE BIOGEL PI IND STRL 7.0 (GLOVE) IMPLANT
GLOVE BIOGEL PI INDICATOR 7.0 (GLOVE) ×2
GLOVE ECLIPSE 6.5 STRL STRAW (GLOVE) ×4 IMPLANT
GLOVE EUDERMIC 7 POWDERFREE (GLOVE) ×3 IMPLANT
GOWN STRL REUS W/ TWL LRG LVL3 (GOWN DISPOSABLE) ×1 IMPLANT
GOWN STRL REUS W/ TWL XL LVL3 (GOWN DISPOSABLE) ×1 IMPLANT
GOWN STRL REUS W/TWL LRG LVL3 (GOWN DISPOSABLE) ×3
GOWN STRL REUS W/TWL XL LVL3 (GOWN DISPOSABLE) ×3
KIT MARKER MARGIN INK (KITS) ×3 IMPLANT
NDL HYPO 25X1 1.5 SAFETY (NEEDLE) ×1 IMPLANT
NEEDLE HYPO 25X1 1.5 SAFETY (NEEDLE) ×3 IMPLANT
NS IRRIG 1000ML POUR BTL (IV SOLUTION) ×3 IMPLANT
PACK BASIN DAY SURGERY FS (CUSTOM PROCEDURE TRAY) ×3 IMPLANT
PENCIL BUTTON HOLSTER BLD 10FT (ELECTRODE) ×3 IMPLANT
SHEET MEDIUM DRAPE 40X70 STRL (DRAPES) IMPLANT
SLEEVE SCD COMPRESS KNEE MED (MISCELLANEOUS) ×3 IMPLANT
SPONGE GAUZE 4X4 12PLY STER LF (GAUZE/BANDAGES/DRESSINGS) IMPLANT
SPONGE LAP 18X18 X RAY DECT (DISPOSABLE) IMPLANT
SPONGE LAP 4X18 X RAY DECT (DISPOSABLE) ×5 IMPLANT
STRIP CLOSURE SKIN 1/2X4 (GAUZE/BANDAGES/DRESSINGS) IMPLANT
SUT ETHILON 3 0 FSL (SUTURE) IMPLANT
SUT MNCRL AB 4-0 PS2 18 (SUTURE) ×3 IMPLANT
SUT SILK 2 0 SH (SUTURE) ×3 IMPLANT
SUT VIC AB 2-0 CT1 27 (SUTURE)
SUT VIC AB 2-0 CT1 TAPERPNT 27 (SUTURE) IMPLANT
SUT VIC AB 3-0 SH 27 (SUTURE)
SUT VIC AB 3-0 SH 27X BRD (SUTURE) IMPLANT
SUT VICRYL 3-0 CR8 SH (SUTURE) ×3 IMPLANT
SYRINGE 10CC LL (SYRINGE) ×3 IMPLANT
TOWEL OR 17X24 6PK STRL BLUE (TOWEL DISPOSABLE) ×3 IMPLANT
TOWEL OR NON WOVEN STRL DISP B (DISPOSABLE) IMPLANT
TUBE CONNECTING 20'X1/4 (TUBING) ×1
TUBE CONNECTING 20X1/4 (TUBING) ×2 IMPLANT
YANKAUER SUCT BULB TIP NO VENT (SUCTIONS) ×3 IMPLANT

## 2014-12-29 NOTE — Op Note (Signed)
Patient Name:           Karen [name]   Date of Surgery:        12/29/2014  Pre op Diagnosis:      Complex sclerosing lesion left breast, upper areolar margin                                      History right breast cancer  Post op Diagnosis:    Same  Procedure:                 Left breast lumpectomy with radioactive seed localization and margin assessment  Surgeon:                     Edsel Petrin. Dalbert Batman, M.D., FACS  Assistant:                      Or staff  Operative Indications:   The patient is a 74 year old female who presents with a complaint of complex sclerosing lesion left breast. This is a very pleasant 74 year old woman, prior patient of mine, referred back to me from radiology for evaluation and management of a complex sclerosing lesion of the left breast at the 11 o'clock position. Past history is significant for a right partial mastectomy and axillary lymph node dissection in 1995 for which she received radiation therapy chemotherapy and tamoxifen.   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 She underwent a right total mastectomy on February 09, 2014 and recovered uneventfully without any chronic pain or arm swelling. Her last office visit was just October 28, 2014. She was due for mammograms.  Screening mammogram of the left breast showed possible new architectural distortion on the left, middle depth central to the nipple. Subsequent imaging showed a 1 cm density in the left breast 11 o'clock position 2 cm from the nipple. There is got a biopsy shows high risk benign complex sclerosing lesion, possible radial scar. She was appropriately referred for surgical biopsy.  Comorbidities include past history of right breast cancer, hypertension, type 2 diabetes, anxiety, hyperlipidemia. She is followed for all of these conditions by Dr. Leanna Battles of [facility] medical.  Operative Findings:       The radioactive seed  and marker clip were deep to the superior areolar margin, and so the lumpectomy was partially subareolar in geography.  The radioactive seed was identified  preoperatively in the holding area using the neoprobe.  The specimen mammogram looked good showing the marker clip and seed in the center of the specimen.  Procedure in Detail:          Following the induction of general LMA anesthesia the patient's left breast was prepped and draped in a sterile fashion.  Intravenous antibiotics were given.  Surgical timeout was performed.  Mammograms containing the clip and seed were reviewed.  0.5% Marcaine with epinephrine was used as local infiltration anesthetic.  Using the neoprobe I found that the maximum radioactivity is right at the areolar margin at about 12:00 position.  I made a superiorly placed circumareolar incision at the areolar margin.  Dissection was carried down into the breast tissue around the radioactive seed using the neoprobe frequently.  Specimen was removed and contained the radioactivity.  The specimen was marked with silk sutures and the 6 color ink kit.  Specimen mammogram looked good as described above.  Specimen was sent to the lab.  Hemostasis was excellent and achieved with electrocautery.  The lumpectomy cavity was closed with interrupted sutures of 3-0 Vicryls and the skin closed with a running subcuticular suture of 4-0 Monocryl and Dermabond.  Breast binder was placed in the patient taken to PACU in stable condition.  EBL 15 mL.  Counts correct.  Complications none.     Edsel Petrin. Dalbert Batman, M.D., FACS General and Minimally Invasive Surgery Breast and Colorectal Surgery  12/29/2014 9:57 AM

## 2014-12-29 NOTE — Addendum Note (Signed)
Addendum  created 12/29/14 1420 by Marrianne Mood, CRNA   Modules edited: Anesthesia Medication Administration

## 2014-12-29 NOTE — Transfer of Care (Signed)
Immediate Anesthesia Transfer of Care Note  Patient: Karen [name]  Procedure(s) Performed: Procedure(s): LEFT BREAST LUMPECTOMY WITH RADIOACTIVE SEED LOCALIZATION (Left)  Patient Location: PACU  Anesthesia Type:General  Level of Consciousness: sedated  Airway & Oxygen Therapy: Patient Spontanous Breathing and Patient connected to face mask oxygen  Post-op Assessment: Report given to RN and Post -op Vital signs reviewed and stable  Post vital signs: Reviewed and stable  Last Vitals:  Filed Vitals:   12/29/14 1004  BP:   Pulse: 81  Temp:   Resp: 12    Complications: No apparent anesthesia complications

## 2014-12-29 NOTE — Interval H&P Note (Signed)
History and Physical Interval Note:  12/29/2014 8:46 AM  Karen [name]  has presented today for surgery, with the diagnosis of left breast mass  The various methods of treatment have been discussed with the patient and family. After consideration of risks, benefits and other options for treatment, the patient has consented to  Procedure(s): LEFT BREAST LUMPECTOMY WITH RADIOACTIVE SEED LOCALIZATION (Left) as a surgical intervention .  The patient's history has been reviewed, patient examined, no change in status, stable for surgery.  I have reviewed the patient's chart and labs.  Questions were answered to the patient's satisfaction.     Adin Hector

## 2014-12-29 NOTE — Addendum Note (Signed)
Addendum  created 12/29/14 1418 by Marrianne Mood, CRNA   Modules edited: Anesthesia Events, Anesthesia Medication Administration

## 2014-12-29 NOTE — Discharge Instructions (Signed)
[facility] °Office Phone Number [phone] ° °BREAST BIOPSY/ PARTIAL MASTECTOMY: POST OP INSTRUCTIONS ° °Always review your discharge instruction sheet given to you by the facility where your surgery was performed. ° °IF YOU HAVE DISABILITY OR FAMILY LEAVE FORMS, YOU MUST BRING THEM TO THE OFFICE FOR PROCESSING.  DO NOT GIVE THEM TO YOUR DOCTOR. ° °1. A prescription for pain medication may be given to you upon discharge.  Take your pain medication as prescribed, if needed.  If narcotic pain medicine is not needed, then you may take acetaminophen (Tylenol) or ibuprofen (Advil) as needed. °2. Take your usually prescribed medications unless otherwise directed °3. If you need a refill on your pain medication, please contact your pharmacy.  They will contact our office to request authorization.  Prescriptions will not be filled after 5pm or on week-ends. °4. You should eat very light the first 24 hours after surgery, such as soup, crackers, pudding, etc.  Resume your normal diet the day after surgery. °5. Most patients will experience some swelling and bruising in the breast.  Ice packs and a good support bra will help.  Swelling and bruising can take several days to resolve.  °6. It is common to experience some constipation if taking pain medication after surgery.  Increasing fluid intake and taking a stool softener will usually help or prevent this problem from occurring.  A mild laxative (Milk of Magnesia or Miralax) should be taken according to package directions if there are no bowel movements after 48 hours. °7. Unless discharge instructions indicate otherwise, you may remove your bandages 24-48 hours after surgery, and you may shower at that time.  You may have steri-strips (small skin tapes) in place directly over the incision.  These strips should be left on the skin for 7-10 days.  If your surgeon used skin glue on the incision, you may shower in 24 hours.  The glue will flake off over the  next 2-3 weeks.  Any sutures or staples will be removed at the office during your follow-up visit. °8. ACTIVITIES:  You may resume regular daily activities (gradually increasing) beginning the next day.  Wearing a good support bra or sports bra minimizes pain and swelling.  You may have sexual intercourse when it is comfortable. °a. You may drive when you no longer are taking prescription pain medication, you can comfortably wear a seatbelt, and you can safely maneuver your car and apply brakes. °b. RETURN TO WORK:  ______________________________________________________________________________________ °9. You should see your doctor in the office for a follow-up appointment approximately two weeks after your surgery.  Your doctor’s nurse will typically make your follow-up appointment when she calls you with your pathology report.  Expect your pathology report 2-3 business days after your surgery.  You may call to check if you do not hear from us after three days. °10. OTHER INSTRUCTIONS: _______________________________________________________________________________________________ _____________________________________________________________________________________________________________________________________ °_____________________________________________________________________________________________________________________________________ °_____________________________________________________________________________________________________________________________________ ° °WHEN TO CALL YOUR DOCTOR: °1. Fever over 101.0 °2. Nausea and/or vomiting. °3. Extreme swelling or bruising. °4. Continued bleeding from incision. °5. Increased pain, redness, or drainage from the incision. ° °The clinic staff is available to answer your questions during regular business hours.  Please don’t hesitate to call and ask to speak to one of the nurses for clinical concerns.  If you have a medical emergency, go to the nearest  emergency room or call 911.  A surgeon from [facility] is always on call at the hospital. ° °For further questions, please visit [email or web address]  ° ° ° °  Post Anesthesia Home Care Instructions ° °Activity: °Get plenty of rest for the remainder of the day. A responsible adult should stay with you for 24 hours following the procedure.  °For the next 24 hours, DO NOT: °-Drive a car °-Operate machinery °-Drink alcoholic beverages °-Take any medication unless instructed by your physician °-Make any legal decisions or sign important papers. ° °Meals: °Start with liquid foods such as gelatin or soup. Progress to regular foods as tolerated. Avoid greasy, spicy, heavy foods. If nausea and/or vomiting occur, drink only clear liquids until the nausea and/or vomiting subsides. Call your physician if vomiting continues. ° °Special Instructions/Symptoms: °Your throat may feel dry or sore from the anesthesia or the breathing tube placed in your throat during surgery. If this causes discomfort, gargle with warm salt water. The discomfort should disappear within 24 hours. ° °If you had a scopolamine patch placed behind your ear for the management of post- operative nausea and/or vomiting: ° °1. The medication in the patch is effective for 72 hours, after which it should be removed.  Wrap patch in a tissue and discard in the trash. Wash hands thoroughly with soap and water. °2. You may remove the patch earlier than 72 hours if you experience unpleasant side effects which may include dry mouth, dizziness or visual disturbances. °3. Avoid touching the patch. Wash your hands with soap and water after contact with the patch. °  ° °

## 2014-12-29 NOTE — Anesthesia Procedure Notes (Signed)
Procedure Name: LMA Insertion Date/Time: 12/29/2014 9:05 AM Performed by: Toula Moos L Pre-anesthesia Checklist: Emergency Drugs available, Suction available, Patient being monitored, Timeout performed and Patient identified Patient Re-evaluated:Patient Re-evaluated prior to inductionOxygen Delivery Method: Circle System Utilized Preoxygenation: Pre-oxygenation with 100% oxygen Intubation Type: IV induction Ventilation: Mask ventilation without difficulty LMA: LMA inserted LMA Size: 4.0 Number of attempts: 1 Airway Equipment and Method: Bite block Placement Confirmation: positive ETCO2 Tube secured with: Tape Dental Injury: Teeth and Oropharynx as per pre-operative assessment

## 2014-12-29 NOTE — Addendum Note (Signed)
Addendum  created 12/29/14 1415 by Marrianne Mood, CRNA   Modules edited: Anesthesia Medication Administration

## 2014-12-29 NOTE — Anesthesia Postprocedure Evaluation (Signed)
  Anesthesia Post-op Note  Patient: Karen [name]  Procedure(s) Performed: Procedure(s): LEFT BREAST LUMPECTOMY WITH RADIOACTIVE SEED LOCALIZATION (Left)  Patient Location: PACU  Anesthesia Type:General  Level of Consciousness: awake, alert  and oriented  Airway and Oxygen Therapy: Patient Spontanous Breathing  Post-op Pain: mild  Post-op Assessment: Post-op Vital signs reviewed              Post-op Vital Signs: Reviewed  Last Vitals:  Filed Vitals:   12/29/14 1145  BP: 146/71  Pulse: 65  Temp: 36.7 C  Resp: 18    Complications: No apparent anesthesia complications

## 2014-12-29 NOTE — Anesthesia Preprocedure Evaluation (Addendum)
Anesthesia Evaluation  Patient identified by MRN, date of birth, ID band Patient awake    Reviewed: Allergy & Precautions, NPO status , Patient's Chart, lab work & pertinent test results, reviewed documented beta blocker date and time   Airway Mallampati: II  TM Distance: <3 FB Neck ROM: Limited    Dental   Pulmonary sleep apnea and Continuous Positive Airway Pressure Ventilation , former smoker,  breath sounds clear to auscultation        Cardiovascular hypertension, Pt. on medications and Pt. on home beta blockers Rhythm:Regular Rate:Normal  Low risk nuclear stress test 10/2014   Neuro/Psych Anxiety negative neurological ROS     GI/Hepatic negative GI ROS, Neg liver ROS,   Endo/Other  diabetes, Type 2, Oral Hypoglycemic Agents  Renal/GU negative Renal ROS     Musculoskeletal  (+) Arthritis -,   Abdominal   Peds  Hematology negative hematology ROS (+)   Anesthesia Other Findings   Reproductive/Obstetrics                            Anesthesia Physical Anesthesia Plan  ASA: II  Anesthesia Plan: General   Post-op Pain Management:    Induction: Intravenous  Airway Management Planned: LMA  Additional Equipment:   Intra-op Plan:   Post-operative Plan:   Informed Consent: I have reviewed the patients History and Physical, chart, labs and discussed the procedure including the risks, benefits and alternatives for the proposed anesthesia with the patient or authorized representative who has indicated his/her understanding and acceptance.   Dental advisory given  Plan Discussed with: CRNA  Anesthesia Plan Comments:         Anesthesia Quick Evaluation

## 2014-12-30 ENCOUNTER — Encounter (HOSPITAL_BASED_OUTPATIENT_CLINIC_OR_DEPARTMENT_OTHER): Payer: Self-pay | Admitting: General Surgery

## 2014-12-30 NOTE — Progress Notes (Signed)
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 All this means is that she needs to be followed closely. I will discuss this with her in detail at her next office visit.  hmi ______

## 2015-01-19 ENCOUNTER — Telehealth: Payer: Self-pay | Admitting: *Deleted

## 2015-01-19 NOTE — Telephone Encounter (Signed)
Received a call from Karen [name] at [facility] stating that Dr. Dalbert Batman has requesting for the pt to be seen sooner due to new findings.  Called pt and confirmed 02/10/15 appt.  Emailed Robin back to make her aware.  Sent request to Dawn/Keisha to print office notes from Dr. Dalbert Batman for me to give to Dr. Jana Hakim.

## 2015-02-10 ENCOUNTER — Ambulatory Visit (HOSPITAL_BASED_OUTPATIENT_CLINIC_OR_DEPARTMENT_OTHER): Payer: Medicare Other | Admitting: Oncology

## 2015-02-10 VITALS — BP 130/55 | HR 66 | Temp 97.7°F | Resp 18 | Ht <= 58 in | Wt 119.2 lb

## 2015-02-10 DIAGNOSIS — N6092 Unspecified benign mammary dysplasia of left breast: Secondary | ICD-10-CM | POA: Diagnosis not present

## 2015-02-10 DIAGNOSIS — D0511 Intraductal carcinoma in situ of right breast: Secondary | ICD-10-CM

## 2015-02-10 DIAGNOSIS — Z853 Personal history of malignant neoplasm of breast: Secondary | ICD-10-CM | POA: Diagnosis not present

## 2015-02-10 DIAGNOSIS — Z170 Estrogen receptor positive status [ER+]: Secondary | ICD-10-CM

## 2015-02-10 NOTE — Progress Notes (Signed)
[place]  [phone] [phone]     ID: [name] OB: 1941/04/12  MR#: [id number]  MBE#:[id number]  PCP: Donnajean Lopes, MD GYN:  Vanessa Kick SU: Fanny Skates OTHER MD: Crissie Reese, Laurence Spates  CHIEF COMPLAINT: second breast cancer  TREATMENT: observation  BREAST CANCER HISTORY: From the original intake note:  I last saw Evolette more than 10 years ago, when Dr. Harlow Asa performed a right partial mastectomy and axillary lymph node dissection for invasive cancer, upper outer quadrant. Jamiria was treated with adjuvant chemotherapy, then radiation, then 5 years of tamoxifen. At this point I cannot retrieve those details, but we have requested them from medical records.   More recently she had routine screening mammography at [facility] 10/04/2013 which showed breast density category C. There was no evidence of malignancy, but given the history and the breast density question, Dr. Philip Aspen requested bilateral breast MRIs from [facility] imaging 11/14/2013. This showed, in the upper inner quadrant of the right breast, a 2.5 cm area of non-mass enhancement measuring 2.5 cm. The left breast was unremarkable and there were no abnormal appearing lymph nodes. There was a 7 mm nodule in the left hepatic lobe which is felt most likely to be a cyst.  Biopsy of the suspicious area in the right breast 11/21/2013 showed ([id number]) ductal carcinoma in situ, low-grade, estrogen receptor 100% positive, progesterone receptor 82% positive. In E-cadherin stain was positive in the majority of the tumor nests.  The patient's subsequent history is as detailed below  INTERVAL HISTORY: Diondra returns today for followup of her ductal carcinoma in situ accompanied by her daughter-in-law, Sharyn Lull and the patient's granddaughter.. Since her last visit here screening mammography showed a possible distortion in the left breast, with subsequent imaging showing a 1 cm density  at the 11:00 position. Biopsy of this area 11/26/2014 showed a complex sclerosing lesion ([id number]). Accordingly the patient returned to surgery and on 12/29/2014 underwent left lumpectomy. This showed (SZA 16-02/01/2001) atypical ductal hyperplasia but no evidence of malignancy.  REVIEW OF SYSTEMS: Liahna is under a great deal of stress. Her husband has been diagnosed with Louis body dementia. It's just her and him at home, although the children do accompany her at night at least sometimes. She has hired a Buyer, retail about 8 hours a day. However her husband really requires 24/7 support. He is not physically aggressive but he is verbally aggressive, very loud, and she is in constant anxiety in her own house. He is under the care of hospice, but of course he may live a long time with this diagnosis. Likely as a result of the stress she is experiencing some memory issues.Marland Kitchen  PAST MEDICAL HISTORY: Past Medical History  Diagnosis Date  . HTN (hypertension)   . Osteopenia   . 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 Anxiety   . High cholesterol   . Arthritis     "hands"  . Sleep apnea     uses CPAP nightly  . Diabetes mellitus without complication   . Malignant neoplasm of breast (female), unspecified site     left    PAST SURGICAL HISTORY: Past Surgical History  Procedure Laterality Date  . Combined laparoscopy w/ hysteroscopy    . Mastectomy Right 02/2014  . Tubal ligation    . Breast lumpectomy Right 1990's  . Total mastectomy Right 02/09/2014    Procedure: RIGHT TOTAL MASTECTOMY;  Surgeon: Edsel Petrin  Dalbert Batman, MD;  Location: [facility];  Service: General;  Laterality: Right;  . Cataract extraction  07/07/14    left eye  . Breast lumpectomy with radioactive seed localization Left 12/29/2014    Procedure: LEFT BREAST LUMPECTOMY WITH RADIOACTIVE SEED LOCALIZATION;  Surgeon: Fanny Skates, MD;  Location: [facility];  Service:  General;  Laterality: Left;    FAMILY HISTORY Family History  Problem Relation Age of Onset  . Hypertension Father   . CVA Father   . Diabetes Mother   . Heart disease Mother   . Breast cancer Paternal Aunt     dx 56s; deceased 13s  . Breast cancer Paternal Aunt     dx late 27s; deceased 52  . Leukemia Cousin     2 paternal female cousins  . Diabetes Brother   . CVA Brother   The patient's father died at the age of 80 from complications of hypertension. Her mother died at the age of 57 from heart disease. The patient had 2 brothers, one sister. On her father's side 2 aunts were diagnosed with breast cancer in their 54s. Also on her father's side a first cousin was diagnosed with breast cancer in her 109s.   GYNECOLOGIC HISTORY:  Menarche age 78, first live birth age 88, the patient is [place] P4. she went through the change of life around 5. She did not take hormone replacement. She never took birth control pills.  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 Her husband Jos used to be a Biomedical scientist. He is now retired as well. Their son Fara Olden is an Estate agent. Son Lonell Face is a Physiological scientist in [place]. Son Edythe Lynn is a businessman in [place] and daughter Anaid Haney lives in [place] still where she works in a [employer]:  In [place]: History  Substance Use Topics  . Smoking status: Former Smoker -- 0.50 packs/day for 30 years    Types: Cigarettes    Quit date: 05/06/1991  . Smokeless tobacco: Never Used  . Alcohol Use: No     Colonoscopy: 2014  PAP: July 2013  Bone density: repeat due July 2015  Lipid panel:  No Known Allergies  Current Outpatient Prescriptions  Medication Sig Dispense Refill  . amLODipine-valsartan (EXFORGE) 5-160 MG per tablet Take 1 tablet by mouth daily.    Marland Kitchen HYDROcodone-acetaminophen (NORCO) 5-325 MG per tablet Take 1-2 tablets by mouth every 6 (six) hours as needed  for moderate pain or severe pain. 30 tablet 0  . metFORMIN (GLUCOPHAGE-XR) 500 MG 24 hr tablet Take 500 mg by mouth daily with breakfast.   6  . metoprolol tartrate (LOPRESSOR) 25 MG tablet Take 25 mg by mouth 2 (two) times daily.     . Multiple Vitamins-Minerals (CENTRUM SILVER ADULT 50+) TABS Take 1 tablet by mouth daily.    . sertraline (ZOLOFT) 25 MG tablet Take 50 mg by mouth daily.     . simvastatin (ZOCOR) 20 MG tablet Take 20 mg by mouth daily.    Marland Kitchen triamcinolone cream (KENALOG) 0.1 % Apply 1 application topically 2 (two) times daily. 30 g 0   No current facility-administered medications for this visit.    OBJECTIVE: middle aged [place] woman who appears stressed Filed Vitals:   02/10/15 1550  BP: 130/55  Pulse: 66  Temp: 97.7 F (36.5 C)  Resp: 18     Body mass index is 24.92 kg/(m^2).  ECOG FS:1 - Symptomatic but completely ambulatory  Sclerae unicteric, EOMs intact Oropharynx clear, full upper plate No cervical or supraclavicular adenopathy Lungs no rales or rhonchi Heart regular rate and rhythm Abd soft, nontender, positive bowel sounds MSK no focal spinal tenderness, no upper extremity lymphedema Neuro: nonfocal, well oriented, stressed affect Breasts: Status post right mastectomy with no evidence of chest wall recurrence. The left breast is status post recent lumpectomy. The incision is healing nicely. There is no evidence of incisional or recurrent disease. The left axilla is benign.  LAB RESULTS:  CMP     Component Value Date/Time   NA 141 12/29/2014 0832   NA 140 12/05/2013 0911   K 4.2 12/29/2014 0832   K 4.4 12/05/2013 0911   CL 101 12/29/2014 0832   CO2 31 01/29/2014 0919   CO2 24 12/05/2013 0911   GLUCOSE 102* 12/29/2014 0832   GLUCOSE 197* 12/05/2013 0911   BUN 22* 12/29/2014 0832   BUN 12.3 12/05/2013 0911   CREATININE 0.90 12/29/2014 0832   CREATININE 0.9 12/05/2013 0911   CALCIUM 10.0 01/29/2014 0919   CALCIUM 9.2 12/05/2013 0911   PROT  8.1 01/29/2014 0919   PROT 7.4 12/05/2013 0911   ALBUMIN 4.2 01/29/2014 0919   ALBUMIN 3.8 12/05/2013 0911   AST 31 01/29/2014 0919   AST 33 12/05/2013 0911   ALT 37* 01/29/2014 0919   ALT 34 12/05/2013 0911   ALKPHOS 50 01/29/2014 0919   ALKPHOS 45 12/05/2013 0911   BILITOT 0.6 01/29/2014 0919   BILITOT 0.45 12/05/2013 0911   GFRNONAA 89* 02/09/2014 1150   GFRAA >90 02/09/2014 1150    I No results found for: SPEP  Lab Results  Component Value Date   WBC 6.0 12/29/2014   NEUTROABS 3.5 12/29/2014   HGB 14.6 12/29/2014   HCT 43.0 12/29/2014   MCV 86.6 12/29/2014   PLT 179 12/29/2014      Chemistry      Component Value Date/Time   NA 141 12/29/2014 0832   NA 140 12/05/2013 0911   K 4.2 12/29/2014 0832   K 4.4 12/05/2013 0911   CL 101 12/29/2014 0832   CO2 31 01/29/2014 0919   CO2 24 12/05/2013 0911   BUN 22* 12/29/2014 0832   BUN 12.3 12/05/2013 0911   CREATININE 0.90 12/29/2014 0832   CREATININE 0.9 12/05/2013 0911      Component Value Date/Time   CALCIUM 10.0 01/29/2014 0919   CALCIUM 9.2 12/05/2013 0911   ALKPHOS 50 01/29/2014 0919   ALKPHOS 45 12/05/2013 0911   AST 31 01/29/2014 0919   AST 33 12/05/2013 0911   ALT 37* 01/29/2014 0919   ALT 34 12/05/2013 0911   BILITOT 0.6 01/29/2014 0919   BILITOT 0.45 12/05/2013 0911       No results found for: LABCA2  No components found for: LABCA125  No results for input(s): INR in the last 168 hours.  Urinalysis    Component Value Date/Time   COLORURINE YELLOW 10/31/2009 0054   APPEARANCEUR CLEAR 10/31/2009 0054   LABSPEC 1.007 10/31/2009 0054   PHURINE 6.5 10/31/2009 0054   GLUCOSEU NEGATIVE 10/31/2009 0054   HGBUR NEGATIVE 10/31/2009 0054   BILIRUBINUR NEGATIVE 10/31/2009 0054   KETONESUR NEGATIVE 10/31/2009 0054   PROTEINUR NEGATIVE 10/31/2009 0054   UROBILINOGEN 0.2 10/31/2009 0054   NITRITE NEGATIVE 10/31/2009 0054   LEUKOCYTESUR SMALL* 10/31/2009 0054    STUDIES: .No results  found.   ASSESSMENT: 74 y.o. [place] woman originally from the [place]  (  1) status post right lumpectomy and axillary lymph node dissection in 1995 for an invasive breast cancer, treated adjuvantly with chemotherapy, radiation, and tamoxifen for 5 years  (2) Status post right breast biopsy 11/21/2013 for ductal carcinoma in situ, low-grade, estrogen and progesterone receptor positive.  (3) genetics testing negative for a BRCA1 or 2 mutation  (4) status post right total mastectomy 02/09/2014 showing a 2 cm area of ductal carcinoma in situ, grade 2, with ample margins  (5) status post left lumpectomy June 21's 2016 for a sclerosing lesion, showing only atypical ductal hyperplasia  PLAN: Natlie has quite a bit on her plate at present and in particular an immense amount of stress related to her husband's dementia. I suggested she go to the gym on a daily basis, exercise moderately but regularly, and have a good lunch. It would be great if she could sleep but right now its very difficult for her because her husband is up at night and screams and she is a only one there.  With all this going on, she does not like the idea of surgery. When she would prefer is close follow-up. We did review her pathology and she understands there is no malignancy in her left breast. What she does have is a precursor of malignancy and it may indeed be the case that she will develop other lesions that will need to be addressed. In that case she tells me she would definitely want her breast removed.  What we're going to do for now is set her up for mammography in 6 months and again in 12 months. At that point she will see me again and we can address this issue further.  Chauncey Cruel, MD   02/10/2015 4:26 PM

## 2015-03-01 ENCOUNTER — Ambulatory Visit: Payer: Medicare Other | Admitting: Oncology

## 2015-04-14 ENCOUNTER — Other Ambulatory Visit: Payer: Self-pay | Admitting: Obstetrics and Gynecology

## 2015-04-15 LAB — CYTOLOGY - PAP

## 2015-06-10 ENCOUNTER — Telehealth: Payer: Self-pay

## 2015-06-10 ENCOUNTER — Ambulatory Visit: Payer: Medicare Other | Admitting: Neurology

## 2015-06-10 NOTE — Telephone Encounter (Signed)
Patient did not show for appt today.                         

## 2015-06-11 ENCOUNTER — Encounter: Payer: Self-pay | Admitting: Neurology

## 2015-06-21 ENCOUNTER — Ambulatory Visit (INDEPENDENT_AMBULATORY_CARE_PROVIDER_SITE_OTHER): Payer: Medicare Other | Admitting: Neurology

## 2015-06-21 ENCOUNTER — Encounter: Payer: Self-pay | Admitting: Neurology

## 2015-06-21 VITALS — BP 118/66 | HR 70 | Resp 14 | Ht <= 58 in | Wt 124.0 lb

## 2015-06-21 DIAGNOSIS — R419 Unspecified symptoms and signs involving cognitive functions and awareness: Secondary | ICD-10-CM

## 2015-06-21 DIAGNOSIS — G4733 Obstructive sleep apnea (adult) (pediatric): Secondary | ICD-10-CM

## 2015-06-21 DIAGNOSIS — Z658 Other specified problems related to psychosocial circumstances: Secondary | ICD-10-CM | POA: Diagnosis not present

## 2015-06-21 DIAGNOSIS — F439 Reaction to severe stress, unspecified: Secondary | ICD-10-CM

## 2015-06-21 DIAGNOSIS — Z9989 Dependence on other enabling machines and devices: Principal | ICD-10-CM

## 2015-06-21 NOTE — Progress Notes (Signed)
Subjective:    Patient ID: Karen [name] is a 74 y.o. female.  HPI     Interim history:   Karen [name] is a very pleasant 74 year old right-handed woman with an underlying medical history of breast cancer on the right, status post lumpectomy, chemotherapy and radiation therapy, hypertension, osteopenia, hypertension, and eczema, who has presents for follow-up consultation of her obstructive sleep apnea, on treatment with CPAP, and her cognitive complaints. The patient is unaccompanied today. Of note, the patient no showed for an appointment on 06/10/2015. I last saw her on 12/09/2014, at which time she reported, that she had to skip nights with CPAP d/t her husband's illness and being in the hospital with him. Overall she had done much better than in the past with her CPAP and actually felt better. She had needle Bx on the L breast. She has support from her 4 kids. She c/o memory loss, mostly forgetfulness and her MMSE was 29/30, AFT was 9/min at the time. I suggested we monitor her memory. I felt that stress was contributing to her memory issues.  Today, 06/21/2015: I reviewed her CPAP compliance data from 05/10/2015 through 06/08/2015 which is a total of 30 days during which time she used her machine 27 days with percent used days greater than 4 hours of only 47%, indicating suboptimal compliance with an average usage of 4 hours only, residual AHI low at 0.7 per hour, leak at times high with the 95th percentile at 25.3 L/m on a pressure of 13 cm with EPR of 3.  More recently, 05/17/2015 through 06/15/2015 her compliance percentage improved to 53%. Nevertheless, it is still suboptimal.  Today, 06/21/2015: She reports feeling fairly stable, no new concerns. Her husband is now at [facility] since October, although she had sitters and their four children were taking turns in staying overnight. She still worries about her memory issues, including forgetfulness, misplacing things, and trouble focusing and  paying attention.   Previously:  I saw her on 08/25/2014, at which time she reported trouble tolerating the mask and the pressure. She was waking up with dry mouth. The strap of the mask was leaving marks on her face. She had gone to her DME representative because of her severe mouth dryness and was advised to increase the humidity, but then she had too much moisture. All in all, she was struggling. However, she did endorse sleeping better compared to before her sleep apnea diagnosis. She had had some medication changes including being on sertraline and low-dose metformin. I suggested we reduced the pressure some for better tolerance and a reduced at 13.  Of note, the patient no showed for an appointment on 11/23/2014.  I reviewed her CPAP compliance data from 11/08/2014 through 12/07/2014 which is a total of 30 days during which time she used her machine 26 days with percent used days greater than 4 hours at 77%, indicating adequate are good compliance, average usage of 4 hours and 53 minutes for all nights, residual AHI low at 0.1 per hour, leak acceptable with occasional increase in the 95th percentile of leak at 25.4 L/m on a pressure of 13 with EPR of 3.  I first met her on 05/05/2013 at the request of her primary care physician, at which time she complained of nonrestorative sleep, sleep disruption, snoring, and daytime somnolence. I invited her back for sleep study. She had a baseline sleep study on 03/08/2014 followed by a CPAP titration study on 05/03/2014. Her baseline sleep efficiency was 84.5% with  a prolonged sleep latency of 29.5 minutes and wake after sleep onset of 39.5 minutes with mild to moderate sleep fragmentation noted. She had an increased percentage of slow-wave sleep and a normal percentage of REM sleep with a mildly reduced REM latency. She had mild PLMS with minimal arousals. She had mild intermittent snoring. Total AHI was 14.8 per hour, rising to 48.4 per hour during REM sleep.  Average oxygen saturation was 92% with a nadir of 76% in REM sleep. Time below 88% saturation was 20 minutes. Based on these test results I asked her to return for a full night CPAP titration study which she had on 05/03/2014. Sleep efficiency was 90.5%. Sleep latency was normal. Wake after sleep onset was 36.5 minutes with mild to moderate sleep fragmentation noted. She had moderate PLMS with mild arousals. She had slow-wave sleep at 12.7% and REM sleep at 18.7% with a slightly reduced REM latency. She had no significant EKG or EEG changes. She was titrated from 5-15 cm and also tried on BiPAP of 16/12 cm to 18/14 cm. She did reasonably well on CPAP of 14 cm. Brief supine REM sleep was achieved. AHI was 0 per hour on the pressure of 14 cm. Based on those test results I prescribed CPAP therapy.  I reviewed her compliance data from 06/11/2014 through 06/22/2014 which is a total of 12 days during which time she used her machine 7 days with percent used days greater than 4 hours of 8%, indicating poor compliance. AHI at 0.1 per hour and 95th percentile of leak at 12.2 L/m, adequate. Pressure at 14 cm with EPR of 3. Average usage of only 1 hour and 26 minutes for all days and 2 hours and 27 minutes 4 days on machine.  I reviewed her compliance data on 06/20/2014 through 07/19/2014 which is a total of 30 days during which time she used her machine only 5 days with percent used days greater than 4 hours of only 3%, indicating noncompliance, residual AHI at 0.4 per hour and leak acceptable at the same pressure.  I reviewed her compliance data from 07/22/2014 through 08/20/2014 which is a total of 30 days during which time she used her machine 27 days with percent used days greater than 4 hours of 50%, indicating much improved compliance but still suboptimal with an average usage of 3 hours and 33 minutes and residual AHI low at 0.5 per hour and leak acceptable at 15.1 L/m at the 95th percentile. The patient called  in December 2015, reporting that she was not using her CPAP machine and wanted to return it. She was advised to come in for follow-up appointment.  Her typical bedtime is reported to be around 9 PM and usual wake time is around 6 AM. Sleep onset typically occurs within 30-60 minutes. She reports feeling marginally rested upon awakening. She wakes up on an average 1 times in the middle of the night and has to go to the bathroom rarely in a typical night. She reports no morning headaches.   She reports occasional excessive daytime somnolence (EDS) and Her Epworth Sleepiness Score (ESS) is 6/24 today. She has not fallen asleep while driving. The patient has been taking a scheduled nap, which is usually after lunch and 1 hour to 1.5 hours long. She denies dreaming in a nap and reports feeling refreshed after a nap. She has been known to snore for the past many years. Snoring is reportedly moderate, but not associated with choking sounds and witnessed  apneas. The patient admits to a rare sense of choking or strangling feeling. There is no report of nighttime reflux, with no nighttime cough experienced. The patient has not noted any RLS symptoms and is not known to kick while asleep or before falling asleep. There is no family history of RLS or OSA.   She is a restless sleeper and in the morning, the bed is quite disheveled.    She denies cataplexy, sleep paralysis, hypnagogic or hypnopompic hallucinations, or sleep attacks. She does not report any vivid dreams, nightmares, dream enactments, or parasomnias, such as sleep talking or sleep walking. The patient has not had a sleep study or a home sleep test.   She consumes 0 caffeinated beverages per day, usually in the form of decaff coffee in the morning.    Her bedroom is usually dark and cool. There is a TV in the bedroom and usually it is on at night.    Her Past Medical History Is Significant For: Past Medical History  Diagnosis Date  . HTN (hypertension)    . Osteopenia   . 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 Anxiety   . High cholesterol   . Arthritis     "hands"  . Sleep apnea     uses CPAP nightly  . Diabetes mellitus without complication ([place])   . Malignant neoplasm of breast (female), unspecified site     left    Her Past Surgical History Is Significant For: Past Surgical History  Procedure Laterality Date  . Combined laparoscopy w/ hysteroscopy    . Mastectomy Right 02/2014  . Tubal ligation    . Breast lumpectomy Right 1990's  . Total mastectomy Right 02/09/2014    Procedure: RIGHT TOTAL MASTECTOMY;  Surgeon: Adin Hector, MD;  Location: [facility];  Service: General;  Laterality: Right;  . Cataract extraction  07/07/14    left eye  . Breast lumpectomy with radioactive seed localization Left 12/29/2014    Procedure: LEFT BREAST LUMPECTOMY WITH RADIOACTIVE SEED LOCALIZATION;  Surgeon: Fanny Skates, MD;  Location: [facility];  Service: General;  Laterality: Left;    Her Family History Is Significant For: Family History  Problem Relation Age of Onset  . Hypertension Father   . CVA Father   . Diabetes Mother   . Heart disease Mother   . Breast cancer Paternal Aunt     dx 9s; deceased 75s  . Breast cancer Paternal Aunt     dx late 30s; deceased 57  . Leukemia Cousin     2 paternal female cousins  . Diabetes Brother   . CVA Brother     Her Social History Is Significant For: Social History   Social History  . Marital Status: Married    Spouse Name: N/A  . Number of Children: N/A  . Years of Education: N/A   Occupational History  . retired Hydrologist    Professor   Social History Main Topics  . Smoking status: Former Smoker -- 0.50 packs/day for 30 years    Types: Cigarettes    Quit date: 05/06/1991  . Smokeless tobacco: Never Used  . Alcohol Use: No  . Drug Use: No  . Sexual Activity: Not Asked   Other Topics Concern  . None   Social History  Narrative    Her Allergies Are:  No Known Allergies:   Her Current Medications Are:  Outpatient Encounter Prescriptions as of 06/21/2015  Medication Sig  . amLODipine-valsartan (EXFORGE) 5-160 MG per tablet Take 1 tablet by mouth daily.  . metFORMIN (GLUCOPHAGE-XR) 500 MG 24 hr tablet Take 500 mg by mouth daily with breakfast.   . metoprolol tartrate (LOPRESSOR) 25 MG tablet Take 25 mg by mouth 2 (two) times daily.   . Multiple Vitamins-Minerals (CENTRUM SILVER ADULT 50+) TABS Take 1 tablet by mouth daily.  . sertraline (ZOLOFT) 50 MG tablet Take 50 mg by mouth daily.  Marland Kitchen triamcinolone cream (KENALOG) 0.1 % Apply 1 application topically 2 (two) times daily.  . [DISCONTINUED] HYDROcodone-acetaminophen (NORCO) 5-325 MG per tablet Take 1-2 tablets by mouth every 6 (six) hours as needed for moderate pain or severe pain.  . [DISCONTINUED] sertraline (ZOLOFT) 25 MG tablet Take 50 mg by mouth daily.   . [DISCONTINUED] simvastatin (ZOCOR) 20 MG tablet Take 20 mg by mouth daily.   No facility-administered encounter medications on file as of 06/21/2015.  :  Review of Systems:  Out of a complete 14 point review of systems, all are reviewed and negative with the exception of these symptoms as listed below:   Review of Systems  Neurological:       Patient is here for f/u. Denies any new concerns or problems.     Objective:  Neurologic Exam  Physical Exam Physical Examination:   Filed Vitals:   06/21/15 1317  BP: 118/66  Pulse: 70  Resp: 14   General Examination: The patient is a very pleasant 74 y.o. female in no acute distress. She appears well-developed and well-nourished and well groomed. She is in good spirits today, but anxious.     HEENT: Normocephalic, atraumatic, pupils are equal, round and reactive to light and accommodation. She has had cataract repairs. Funduscopic exam is normal with sharp disc margins noted. She has new eye glasses. Extraocular tracking is good without  limitation to gaze excursion or nystagmus noted. Normal smooth pursuit is noted. Hearing is grossly intact. Face is symmetric with normal facial animation and normal facial sensation. Speech is clear with no dysarthria noted. There is no hypophonia. There is no lip, neck/head, jaw or voice tremor. Neck is supple with full range of passive and active motion. There are no carotid bruits on auscultation. Oropharynx exam reveals: mild mouth dryness, good dental hygiene and mild airway crowding, due to narrow airway and floppy soft palate. Mallampati is class II. Tongue protrudes centrally and palate elevates symmetrically. Tonsils are 1+ in size/absent.   Chest: Clear to auscultation without wheezing, rhonchi or crackles noted.  Heart: S1+S2+0, regular and normal without murmurs, rubs or gallops noted.   Abdomen: Soft, non-tender and non-distended with normal bowel sounds appreciated on auscultation.  Extremities: There is no pitting edema in the distal lower extremities bilaterally. Pedal pulses are intact.  Skin: Warm and dry without trophic changes noted. There are no varicose veins.  Musculoskeletal: exam reveals no obvious joint deformities, tenderness or joint swelling or erythema.   Neurologically:  Mental status: The patient is awake, alert and oriented in all 4 spheres. Her memory, attention, language and knowledge are appropriate. There is no aphasia, agnosia, apraxia or anomia. Speech is clear with normal prosody and enunciation. Thought process is linear. Mood is congruent and affect is normal.  On 12/09/2014: MMSE: 29/30, AFT: 9/min.  On 06/21/2015: MMSE: 29/30, CDT: 4/4, AFT: 13/min.   Cranial nerves are as described above under HEENT exam. In addition, shoulder shrug is normal with equal shoulder height noted. Motor exam: Normal bulk, strength  and tone is noted. There is no drift, tremor or rebound. Romberg is negative. Reflexes are 2+ throughout. Fine motor skills are intact with  normal finger taps, normal hand movements, normal rapid alternating patting, normal foot taps and normal foot agility.  Cerebellar testing shows no dysmetria or intention tremor on finger to nose testing. Heel to shin is unremarkable bilaterally. There is no truncal or gait ataxia.  Sensory exam is intact to light touch in the upper and lower extremities.  Gait, station and balance are unremarkable. No veering to one side is noted. No leaning to one side is noted. Posture is age-appropriate and stance is narrow based. No problems turning are noted. She turns en bloc. Tandem walk is slightly difficult for her.    Assessment and Plan:   In summary, SALSABEEL GORELICK is a very pleasant 74 year old female with an underlying medical history of breast cancer on the right, status post lumpectomy, chemotherapy and radiation therapy, hypertension, osteopenia, hypertension, and eczema, who presents for follow-up consultation of her obstructive sleep apnea. Her compliance is still suboptimal but in light of her initial adjustment issues and stress, she has done well. Her memory is stable. Memory scores are stable to slightly better today. I reassured in that regard. I suggested we continue to monitor her for this. I think stress is a big contributor in her case. Thankfully she has good support from her 4 children who are all local or in drivable distance. She has done better with her CPAP compliance and actually feels better with her sleep but still reports issues with her memory including forgetfulness and misplacing things and not being able to focus. I reassured her that her memory scores a good and stable. Nevertheless, we can pursue a brain MRI at this time. We will also consider a formal neuropsychological evaluation down the road if necessary. I did not suggest any new medications at this time. I reassured her that her memory issues and cognitive complaints could be in keeping with age-appropriate memory loss,  exacerbated by stress. We will also continue to monitor. She is advised that her exam is stable and the I can see her back in 6 months, sooner if needed and in the interim we will call her with her MRI results. I answered all her questions today and the patient was in agreement with the plan.   I spent 25 minutes in total face-to-face time with the patient, more than 50% of which was spent in counseling and coordination of care, reviewing test results, reviewing medication and discussing or reviewing the diagnosis of OSA and memory loss, the prognosis and treatment options.

## 2015-06-21 NOTE — Patient Instructions (Signed)
Please continue using your CPAP regularly. While your insurance requires that you use CPAP at least 4 hours each night on 70% of the nights, I recommend, that you not skip any nights and use it throughout the night if you can. Getting used to CPAP and staying with the treatment long term does take time and patience and discipline. Untreated obstructive sleep apnea when it is moderate to severe can have an adverse impact on cardiovascular health and raise her risk for heart disease, arrhythmias, hypertension, congestive heart failure, stroke and diabetes. Untreated obstructive sleep apnea causes sleep disruption, nonrestorative sleep, and sleep deprivation. This can have an impact on your day to day functioning and cause daytime sleepiness and impairment of cognitive function, memory loss, mood disturbance, and problems focussing. Using CPAP regularly can improve these symptoms.  We will continue to monitory your memory.   We will do a brain scan, called MRI and call you with the test results. We will have to schedule you for this on a separate date. This test requires authorization from your insurance, and we will take care of the insurance process.

## 2015-07-07 ENCOUNTER — Other Ambulatory Visit: Payer: Self-pay | Admitting: *Deleted

## 2015-07-20 ENCOUNTER — Other Ambulatory Visit: Payer: Medicare Other

## 2015-07-20 ENCOUNTER — Ambulatory Visit
Admission: RE | Admit: 2015-07-20 | Discharge: 2015-07-20 | Disposition: A | Discharge status: Home / Self Care | Payer: Medicare Other | Source: Ambulatory Visit | Attending: Neurology | Admitting: Neurology

## 2015-07-20 DIAGNOSIS — R419 Unspecified symptoms and signs involving cognitive functions and awareness: Secondary | ICD-10-CM | POA: Diagnosis not present

## 2015-07-20 MED ORDER — GADOBENATE DIMEGLUMINE 529 MG/ML IV SOLN
10.0000 mL | Freq: Once | INTRAVENOUS | Status: AC | PRN
  Administered 2015-07-20: 10 mL via INTRAVENOUS

## 2015-07-22 ENCOUNTER — Telehealth: Payer: Self-pay

## 2015-07-22 NOTE — Progress Notes (Signed)
Quick Note:  Please call patient regarding the recent brain MRI: The brain scan showed a normal structure of the brain and no significant volume loss which we call atrophy. There were changes in the deeper structures of the brain, which we call white matter changes or microvascular changes. These were reported as very mild in Her case. These are tiny white spots, that occur with time and are seen in a variety of conditions, including with normal aging, chronic hypertension, chronic headaches, especially migraine HAs, chronic diabetes, chronic hyperlipidemia. These are not strokes and no mass or lesion were seen which is reassuring. Again, there were no acute findings, such as a stroke, or mass or blood products. No further action is required on this test at this time, other than re-enforcing the importance of good blood pressure control, good cholesterol control, good blood sugar control, and weight management. Please remind patient to keep any upcoming appointments or tests and to call us with any interim questions, concerns, problems or updates. Thanks,  Star Age, MD, PhD    ______

## 2015-07-22 NOTE — Telephone Encounter (Signed)
-----   Message from Star Age, MD sent at 07/22/2015  7:46 AM EST ----- Please call patient regarding the recent brain MRI: The brain scan showed a normal structure of the brain and no significant volume loss which we call atrophy. There were changes in the deeper structures of the brain, which we call white matter changes or microvascular changes. These were reported as very mild in Her case. These are tiny white spots, that occur with time and are seen in a variety of conditions, including with normal aging, chronic hypertension, chronic headaches, especially migraine HAs, chronic diabetes, chronic hyperlipidemia. These are not strokes and no mass or lesion were seen which is reassuring. Again, there were no acute findings, such as a stroke, or mass or blood products. No further action is required on this test at this time, other than re-enforcing the importance of good blood pressure control, good cholesterol control, good blood sugar control, and weight management. Please remind patient to keep any upcoming appointments or tests and to call us with any interim questions, concerns, problems or updates. Thanks,  Star Age, MD, PhD

## 2015-07-22 NOTE — Telephone Encounter (Signed)
I spoke to patient and gave results and recommendations. She voiced understanding.  

## 2015-09-27 ENCOUNTER — Other Ambulatory Visit: Payer: Self-pay | Admitting: Family Medicine

## 2015-09-28 ENCOUNTER — Encounter: Payer: Self-pay | Admitting: *Deleted

## 2015-12-20 ENCOUNTER — Encounter: Payer: Self-pay | Admitting: Neurology

## 2015-12-20 ENCOUNTER — Ambulatory Visit (INDEPENDENT_AMBULATORY_CARE_PROVIDER_SITE_OTHER): Payer: Medicare Other | Admitting: Neurology

## 2015-12-20 VITALS — BP 142/76 | HR 76 | Resp 16 | Ht <= 58 in | Wt 122.0 lb

## 2015-12-20 DIAGNOSIS — R419 Unspecified symptoms and signs involving cognitive functions and awareness: Secondary | ICD-10-CM

## 2015-12-20 DIAGNOSIS — G4733 Obstructive sleep apnea (adult) (pediatric): Secondary | ICD-10-CM | POA: Diagnosis not present

## 2015-12-20 DIAGNOSIS — Z9989 Dependence on other enabling machines and devices: Principal | ICD-10-CM

## 2015-12-20 NOTE — Progress Notes (Signed)
Subjective:    Patient ID: Karen [name] is a 75 y.o. female.  HPI     Interim history:   Karen [name] is a very pleasant 75 year old right-handed woman with an underlying medical history of breast cancer on the right, status post lumpectomy, chemotherapy and radiation therapy, hypertension, osteopenia, hypertension, and eczema, who has presents for follow-up consultation of her obstructive sleep apnea, on treatment with CPAP, and her cognitive complaints. The patient is accompanied by her oldest daughter-in-law, Karen [name], today. I last saw her on 06/21/2015, at which time she reported feeling fairly stable, her husband was at [facility] since October 2016, and she had sitters and their four children were taking turns in staying overnight. She was still worried about her memory issues, including forgetfulness, misplacing things, and trouble focusing and paying attention. Her scores were stable. Her CPAP compliance was suboptimal.  Today, 12/20/2015: I reviewed her CPAP compliance data from 11/19/15 to 12/18/2015, which is a total of 30 days, during which time she used her machine 27 days with percent used days greater than 4 hours at 63%, indicating suboptimal compliance but improved from 6 months ago. Average usage of 4 hours and 22 minutes, residual AHI 0.8 per hour, leak acceptable for the most part but 95th percentile of 24.3 L/m on a pressure of 13 cm with EPR of 3.  Today, 12/20/2015: She reports overall doing okay. She lives alone. Sadly, she lost her husband in January 2017. All 3 sons live close by, daughter lives about an hour or so way. She has good support. She still drives but has had issues with occasional bronchial turns or getting confused about even routine routes. She had an MRI in January 2017:IMPRESSION:  Mildly abnormal MRI brain (with and without) demonstrating: 1. Few scattered periventricular and subcortical foci of non-specific gliosis, likely chronic small vessel ischemic  disease. No abnormal lesions are seen on post contrast views.  2. No acute findings. She has hearing aids now, for the past 3 weeks    Previously:  Of note, the patient no showed for an appointment on 06/10/2015. I last saw her on 12/09/2014, at which time she reported, that she had to skip nights with CPAP d/t her husband's illness and being in the hospital with him. Overall she had done much better than in the past with her CPAP and actually felt better. She had needle Bx on the L breast. She has support from her 4 kids. She c/o memory loss, mostly forgetfulness and her MMSE was 29/30, AFT was 9/min at the time. I suggested we monitor her memory. I felt that stress was contributing to her memory issues.  I reviewed her CPAP compliance data from 05/10/2015 through 06/08/2015 which is a total of 30 days during which time she used her machine 27 days with percent used days greater than 4 hours of only 47%, indicating suboptimal compliance with an average usage of 4 hours only, residual AHI low at 0.7 per hour, leak at times high with the 95th percentile at 25.3 L/m on a pressure of 13 cm with EPR of 3. More recently, 05/17/2015 through 06/15/2015 her compliance percentage improved to 53%. Nevertheless, it is still suboptimal.  I saw her on 08/25/2014, at which time she reported trouble tolerating the mask and the pressure. She was waking up with dry mouth. The strap of the mask was leaving marks on her face. She had gone to her DME representative because of her severe mouth dryness and was advised to  increase the humidity, but then she had too much moisture. All in all, she was struggling. However, she did endorse sleeping better compared to before her sleep apnea diagnosis. She had had some medication changes including being on sertraline and low-dose metformin. I suggested we reduced the pressure some for better tolerance and a reduced at 13.  Of note, the patient no showed for an appointment on  11/23/2014.  I reviewed her CPAP compliance data from 11/08/2014 through 12/07/2014 which is a total of 30 days during which time she used her machine 26 days with percent used days greater than 4 hours at 77%, indicating adequate are good compliance, average usage of 4 hours and 53 minutes for all nights, residual AHI low at 0.1 per hour, leak acceptable with occasional increase in the 95th percentile of leak at 25.4 L/m on a pressure of 13 with EPR of 3.  I first met her on 05/05/2013 at the request of her primary care physician, at which time she complained of nonrestorative sleep, sleep disruption, snoring, and daytime somnolence. I invited her back for sleep study. She had a baseline sleep study on 03/08/2014 followed by a CPAP titration study on 05/03/2014. Her baseline sleep efficiency was 84.5% with a prolonged sleep latency of 29.5 minutes and wake after sleep onset of 39.5 minutes with mild to moderate sleep fragmentation noted. She had an increased percentage of slow-wave sleep and a normal percentage of REM sleep with a mildly reduced REM latency. She had mild PLMS with minimal arousals. She had mild intermittent snoring. Total AHI was 14.8 per hour, rising to 48.4 per hour during REM sleep. Average oxygen saturation was 92% with a nadir of 76% in REM sleep. Time below 88% saturation was 20 minutes. Based on these test results I asked her to return for a full night CPAP titration study which she had on 05/03/2014. Sleep efficiency was 90.5%. Sleep latency was normal. Wake after sleep onset was 36.5 minutes with mild to moderate sleep fragmentation noted. She had moderate PLMS with mild arousals. She had slow-wave sleep at 12.7% and REM sleep at 18.7% with a slightly reduced REM latency. She had no significant EKG or EEG changes. She was titrated from 5-15 cm and also tried on BiPAP of 16/12 cm to 18/14 cm. She did reasonably well on CPAP of 14 cm. Brief supine REM sleep was achieved. AHI was 0 per  hour on the pressure of 14 cm. Based on those test results I prescribed CPAP therapy.  I reviewed her compliance data from 06/11/2014 through 06/22/2014 which is a total of 12 days during which time she used her machine 7 days with percent used days greater than 4 hours of 8%, indicating poor compliance. AHI at 0.1 per hour and 95th percentile of leak at 12.2 L/m, adequate. Pressure at 14 cm with EPR of 3. Average usage of only 1 hour and 26 minutes for all days and 2 hours and 27 minutes 4 days on machine.  I reviewed her compliance data on 06/20/2014 through 07/19/2014 which is a total of 30 days during which time she used her machine only 5 days with percent used days greater than 4 hours of only 3%, indicating noncompliance, residual AHI at 0.4 per hour and leak acceptable at the same pressure.  I reviewed her compliance data from 07/22/2014 through 08/20/2014 which is a total of 30 days during which time she used her machine 27 days with percent used days greater than 4 hours  of 50%, indicating much improved compliance but still suboptimal with an average usage of 3 hours and 33 minutes and residual AHI low at 0.5 per hour and leak acceptable at 15.1 L/m at the 95th percentile. The patient called in December 2015, reporting that she was not using her CPAP machine and wanted to return it. She was advised to come in for follow-up appointment.  Her typical bedtime is reported to be around 9 PM and usual wake time is around 6 AM. Sleep onset typically occurs within 30-60 minutes. She reports feeling marginally rested upon awakening. She wakes up on an average 1 times in the middle of the night and has to go to the bathroom rarely in a typical night. She reports no morning headaches.   She reports occasional excessive daytime somnolence (EDS) and Her Epworth Sleepiness Score (ESS) is 6/24 today. She has not fallen asleep while driving. The patient has been taking a scheduled nap, which is usually after  lunch and 1 hour to 1.5 hours long. She denies dreaming in a nap and reports feeling refreshed after a nap. She has been known to snore for the past many years. Snoring is reportedly moderate, but not associated with choking sounds and witnessed apneas. The patient admits to a rare sense of choking or strangling feeling. There is no report of nighttime reflux, with no nighttime cough experienced. The patient has not noted any RLS symptoms and is not known to kick while asleep or before falling asleep. There is no family history of RLS or OSA.   She is a restless sleeper and in the morning, the bed is quite disheveled.    She denies cataplexy, sleep paralysis, hypnagogic or hypnopompic hallucinations, or sleep attacks. She does not report any vivid dreams, nightmares, dream enactments, or parasomnias, such as sleep talking or sleep walking. The patient has not had a sleep study or a home sleep test.   She consumes 0 caffeinated beverages per day, usually in the form of decaff coffee in the morning.    Her bedroom is usually dark and cool. There is a TV in the bedroom and usually it is on at night.    Her Past Medical History Is Significant For: Past Medical History  Diagnosis Date  . HTN (hypertension)   . Osteopenia   . 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 Anxiety   . High cholesterol   . Arthritis     "hands"  . Sleep apnea     uses CPAP nightly  . Diabetes mellitus without complication ([place])   . Malignant neoplasm of breast (female), unspecified site     left    Her Past Surgical History Is Significant For: Past Surgical History  Procedure Laterality Date  . Combined laparoscopy w/ hysteroscopy    . Mastectomy Right 02/2014  . Tubal ligation    . Breast lumpectomy Right 1990's  . Total mastectomy Right 02/09/2014    Procedure: RIGHT TOTAL MASTECTOMY;  Surgeon: Adin Hector, MD;  Location: [place];  Service: General;  Laterality:  Right;  . Cataract extraction  07/07/14    left eye  . Breast lumpectomy with radioactive seed localization Left 12/29/2014    Procedure: LEFT BREAST LUMPECTOMY WITH RADIOACTIVE SEED LOCALIZATION;  Surgeon: Fanny Skates, MD;  Location: [place];  Service: General;  Laterality: Left;    Her Family History Is Significant For: Family History  Problem Relation Age of  Onset  . Hypertension Father   . CVA Father   . Diabetes Mother   . Heart disease Mother   . Breast cancer Paternal Aunt     dx 76s; deceased 28s  . Breast cancer Paternal Aunt     dx late 33s; deceased 27  . Leukemia Cousin     2 paternal female cousins  . Diabetes Brother   . CVA Brother     Her Social History Is Significant For: Social History   Social History  . Marital Status: Married    Spouse Name: N/A  . Number of Children: N/A  . Years of Education: N/A   Occupational History  . retired Hydrologist    Professor   Social History Main Topics  . Smoking status: Former Smoker -- 0.50 packs/day for 30 years    Types: Cigarettes    Quit date: 05/06/1991  . Smokeless tobacco: Never Used  . Alcohol Use: No  . Drug Use: No  . Sexual Activity: Not Asked   Other Topics Concern  . None   Social History Narrative    Her Allergies Are:  No Known Allergies:   Her Current Medications Are:  Outpatient Encounter Prescriptions as of 12/20/2015  Medication Sig  . amLODipine-valsartan (EXFORGE) 5-160 MG per tablet Take 1 tablet by mouth daily.  . metFORMIN (GLUCOPHAGE-XR) 500 MG 24 hr tablet Take 500 mg by mouth daily with breakfast.   . metoprolol tartrate (LOPRESSOR) 25 MG tablet Take 25 mg by mouth 2 (two) times daily.   . Multiple Vitamins-Minerals (CENTRUM SILVER ADULT 50+) TABS Take 1 tablet by mouth daily.  . sertraline (ZOLOFT) 50 MG tablet Take 50 mg by mouth daily.  Marland Kitchen triamcinolone cream (KENALOG) 0.1 % Apply 1 application topically 2 (two) times daily.   No facility-administered  encounter medications on file as of 12/20/2015.  :  Review of Systems:  Out of a complete 14 point review of systems, all are reviewed and negative with the exception of these symptoms as listed below:   Review of Systems  Neurological:       Patient feels like she is having some leaking from her CPAP mask. Needs order for new supplies.  Patient feels like she is still getting confused and forgetful.     Objective:  Neurologic Exam  Physical Exam Physical Examination:   Filed Vitals:   12/20/15 1306  BP: 142/76  Pulse: 76  Resp: 16   General Examination: The patient is a very pleasant 75 y.o. female in no acute distress. She appears well-developed and well-nourished and well groomed. She is in good spirits today.     HEENT: Normocephalic, atraumatic, pupils are equal, round and reactive to light and accommodation. She has had cataract repairs. She how has hearing aids. Extraocular tracking is good without limitation to gaze excursion or nystagmus noted. Normal smooth pursuit is noted. Hearing is grossly intact. Face is symmetric with normal facial animation and normal facial sensation. Speech is clear with no dysarthria noted. There is no hypophonia. There is no lip, neck/head, jaw or voice tremor. Neck is supple with full range of passive and active motion. There are no carotid bruits on auscultation. Oropharynx exam reveals: mild mouth dryness, good dental hygiene and mild airway crowding, due to narrow airway and floppy soft palate. Mallampati is class II. Tongue protrudes centrally and palate elevates symmetrically. Tonsils are 1+ in size/absent.   Chest: Clear to auscultation without wheezing, rhonchi or crackles noted.  Heart: S1+S2+0, regular  and normal without murmurs, rubs or gallops noted.   Abdomen: Soft, non-tender and non-distended with normal bowel sounds appreciated on auscultation.  Extremities: There is no pitting edema in the distal lower extremities bilaterally.  Pedal pulses are intact.  Skin: Warm and dry without trophic changes noted. There are no varicose veins.  Musculoskeletal: exam reveals no obvious joint deformities, tenderness or joint swelling or erythema.   Neurologically:  Mental status: The patient is awake, alert and oriented in all 4 spheres. Her memory, attention, language and knowledge are appropriate. There is no aphasia, agnosia, apraxia or anomia. Speech is clear with normal prosody and enunciation. Thought process is linear. Mood is congruent and affect is normal.  On 12/09/2014: MMSE: 29/30, AFT: 9/min.  On 06/21/2015: MMSE: 29/30, CDT: 4/4, AFT: 13/min.  On 12/20/2015: MMSE: 29/30, CDT: 4/4, AFT: 9/min.    Cranial nerves are as described above under HEENT exam. In addition, shoulder shrug is normal with equal shoulder height noted. Motor exam: Normal bulk, strength and tone is noted. There is no drift, tremor or rebound. Romberg is negative. Reflexes are 2+ throughout. Fine motor skills are intact with normal finger taps, normal hand movements, normal rapid alternating patting, normal foot taps and normal foot agility.  Cerebellar testing shows no dysmetria or intention tremor on finger to nose testing. Heel to shin is unremarkable bilaterally. There is no truncal or gait ataxia.  Sensory exam is intact to light touch in the upper and lower extremities.  Gait, station and balance are unremarkable. No veering to one side is noted. No leaning to one side is noted. Posture is age-appropriate and stance is narrow based. No problems turning are noted. She turns en bloc. Tandem walk is slightly difficult for her, unchanged.    Assessment and Plan:   In summary, ILIYANA CONVEY is a very pleasant 75 year old female with an underlying medical history of breast cancer on the right, status post lumpectomy, chemotherapy and radiation therapy, hypertension, osteopenia, hypertension, and eczema, who presents for follow-up consultation of her  obstructive sleep apneaAnd her memory complaints. Her memory scores have always been stable. Nevertheless, her daughter-in-law does report a approximately 2 year history of forgetfulness, patient tends to ask same questions over and over again and repeat herself. She is advised that we will continue to monitor at this point I would like to request a formal neuropsychological evaluation. She has very good support from all 4 children. They all live in drivable distance. She had a brain MRI with and without contrast on 07/20/2015: IMPRESSION:  Mildly abnormal MRI brain (with and without) demonstrating: 1. Few scattered periventricular and subcortical foci of non-specific gliosis, likely chronic small vessel ischemic disease. No abnormal lesions are seen on post contrast views.  2. No acute findings. She is advised to be fully compliant with CPAP therapy. Her compliance has improved. She is advised to maintain good nutrition and good hydration and maintain enough sleep time. I will see her back in 6 months, sooner if needed. I answered all her questions today and the patient was in agreement with the plan.   I spent 25 minutes in total face-to-face time with the patient, more than 50% of which was spent in counseling and coordination of care, reviewing test results, reviewing medication and discussing or reviewing the diagnosis of OSA and memory loss, the prognosis and treatment options.

## 2015-12-20 NOTE — Patient Instructions (Addendum)
Please continue using your CPAP regularly. While your insurance requires that you use CPAP at least 4 hours each night on 70% of the nights, I recommend, that you not skip any nights and use it throughout the night if you can. Getting used to CPAP and staying with the treatment long term does take time and patience and discipline. Untreated obstructive sleep apnea when it is moderate to severe can have an adverse impact on cardiovascular health and raise her risk for heart disease, arrhythmias, hypertension, congestive heart failure, stroke and diabetes. Untreated obstructive sleep apnea causes sleep disruption, nonrestorative sleep, and sleep deprivation. This can have an impact on your day to day functioning and cause daytime sleepiness and impairment of cognitive function, memory loss, mood disturbance, and problems focussing. Using CPAP regularly can improve these symptoms.  You may want to call [facility] for a mask refit.   I will prescribe CPAP supplies.   We will request a formal neuropsychological test (aka cognitive testing) for your memory complaints. This requires a referral to a trained and licensed neuropsychologist and will be a separate appointment at a different clinic.

## 2016-03-08 ENCOUNTER — Ambulatory Visit: Payer: Medicare Other | Admitting: Psychology

## 2016-06-20 ENCOUNTER — Ambulatory Visit (INDEPENDENT_AMBULATORY_CARE_PROVIDER_SITE_OTHER): Payer: Medicare Other | Admitting: Neurology

## 2016-06-20 ENCOUNTER — Encounter: Payer: Self-pay | Admitting: Neurology

## 2016-06-20 VITALS — BP 141/71 | HR 57 | Resp 20 | Ht <= 58 in | Wt 120.0 lb

## 2016-06-20 DIAGNOSIS — Z9989 Dependence on other enabling machines and devices: Secondary | ICD-10-CM | POA: Diagnosis not present

## 2016-06-20 DIAGNOSIS — R413 Other amnesia: Secondary | ICD-10-CM | POA: Diagnosis not present

## 2016-06-20 DIAGNOSIS — G4733 Obstructive sleep apnea (adult) (pediatric): Secondary | ICD-10-CM | POA: Diagnosis not present

## 2016-06-20 DIAGNOSIS — R419 Unspecified symptoms and signs involving cognitive functions and awareness: Secondary | ICD-10-CM

## 2016-06-20 NOTE — Progress Notes (Signed)
Subjective:    Patient ID: Karen [name] is a 75 y.o. female.  HPI     Interim history:   Karen [name] is a very pleasant 75 year old right-handed woman with an underlying medical history of breast cancer on the right, status post lumpectomy, chemotherapy and radiation therapy, hypertension, osteopenia, hypertension, and eczema, who presents for follow-up consultation of her obstructive sleep apnea, on treatment with CPAP, and her cognitive complaints. The patient is unaccompanied today. I last saw her on 12/20/2015, at which time she reported doing overall fairly well. She had recently lost her husband in January 2017 after a prolonged illness and progressive decline. She was living alone. She had good support from her 3 sons and her daughter. She was driving but did report occasional issues with getting easily confused while driving. She had an MRI brain with and without contrast in January 2017 and we reviewed the results: IMPRESSION:   Mildly abnormal MRI brain (with and without) demonstrating: 1. Few scattered periventricular and subcortical foci of non-specific gliosis, likely chronic small vessel ischemic disease. No abnormal lesions are seen on post contrast views.    2. No acute findings. She had new hearing aids.  I suggested we proceed with formal neuropsychological evaluation. She did not have the test or consultation done and it appears she canceled an appointment with Dr. Valentina Shaggy on 03/08/2016.  Today, 06/20/2016: I reviewed her CPAP compliance data from 05/18/2016 through 06/16/2016 which is a total of 30 days, during which time she used her CPAP 24 days with percent used days greater than 4 hours at 53%, indicating suboptimal compliance with an average usage of 4 hours and 46 minutes for days on treatment, residual AHI 2.3 per hour, leak on the high side with the 95th percentile at 28.7 L/m on a pressure of 13 cm with EPR of 3.  Today, 06/20/2016: She reports doing about the same. She  feels that her memory is stable. She reports no issues with her CPAP. She does admit to pulling of the mask at time, without realizing it. Some nights she skips. She has been traveling some this year. She sometimes wakes up in the middle of the night and takes it off. In the last 90 days, the compliance was also 53%. She cancelled on 02/29/16 and gives no clear reason, but would be willing to reschedule with someone else, since Dr. Valentina Shaggy left. Gets a little low in her mood, no sinister depression Sx.   Previously:   I saw her on 06/21/2015, at which time she reported feeling fairly stable, her husband was at [facility] since October 2016, and she had sitters and their four children were taking turns in staying overnight. She was still worried about her memory issues, including forgetfulness, misplacing things, and trouble focusing and paying attention. Her scores were stable. Her CPAP compliance was suboptimal.   12/20/2015: I reviewed her CPAP compliance data from 11/19/15 to 12/18/2015, which is a total of 30 days, during which time she used her machine 27 days with percent used days greater than 4 hours at 63%, indicating suboptimal compliance but improved from 6 months ago. Average usage of 4 hours and 22 minutes, residual AHI 0.8 per hour, leak acceptable for the most part but 95th percentile of 24.3 L/m on a pressure of 13 cm with EPR of 3.   Of note, the patient no showed for an appointment on 06/10/2015. I last saw her on 12/09/2014, at which time she reported, that she had to  skip nights with CPAP d/t her husband's illness and being in the hospital with him. Overall she had done much better than in the past with her CPAP and actually felt better. She had needle Bx on the L breast. She has support from her 4 kids. She c/o memory loss, mostly forgetfulness and her MMSE was 29/30, AFT was 9/min at the time. I suggested we monitor her memory. I felt that stress was contributing to her memory issues.    I reviewed her CPAP compliance data from 05/10/2015 through 06/08/2015 which is a total of 30 days during which time she used her machine 27 days with percent used days greater than 4 hours of only 47%, indicating suboptimal compliance with an average usage of 4 hours only, residual AHI low at 0.7 per hour, leak at times high with the 95th percentile at 25.3 L/m on a pressure of 13 cm with EPR of 3. More recently, 05/17/2015 through 06/15/2015 her compliance percentage improved to 53%. Nevertheless, it is still suboptimal.   I saw her on 08/25/2014, at which time she reported trouble tolerating the mask and the pressure. She was waking up with dry mouth. The strap of the mask was leaving marks on her face. She had gone to her DME representative because of her severe mouth dryness and was advised to increase the humidity, but then she had too much moisture. All in all, she was struggling. However, she did endorse sleeping better compared to before her sleep apnea diagnosis. She had had some medication changes including being on sertraline and low-dose metformin. I suggested we reduced the pressure some for better tolerance and a reduced at 13.   Of note, the patient no showed for an appointment on 11/23/2014.   I reviewed her CPAP compliance data from 11/08/2014 through 12/07/2014 which is a total of 30 days during which time she used her machine 26 days with percent used days greater than 4 hours at 77%, indicating adequate are good compliance, average usage of 4 hours and 53 minutes for all nights, residual AHI low at 0.1 per hour, leak acceptable with occasional increase in the 95th percentile of leak at 25.4 L/m on a pressure of 13 with EPR of 3.   I first met her on 05/05/2013 at the request of her primary care physician, at which time she complained of nonrestorative sleep, sleep disruption, snoring, and daytime somnolence. I invited her back for sleep study. She had a baseline sleep study on  03/08/2014 followed by a CPAP titration study on 05/03/2014. Her baseline sleep efficiency was 84.5% with a prolonged sleep latency of 29.5 minutes and wake after sleep onset of 39.5 minutes with mild to moderate sleep fragmentation noted. She had an increased percentage of slow-wave sleep and a normal percentage of REM sleep with a mildly reduced REM latency. She had mild PLMS with minimal arousals. She had mild intermittent snoring. Total AHI was 14.8 per hour, rising to 48.4 per hour during REM sleep. Average oxygen saturation was 92% with a nadir of 76% in REM sleep. Time below 88% saturation was 20 minutes. Based on these test results I asked her to return for a full night CPAP titration study which she had on 05/03/2014. Sleep efficiency was 90.5%. Sleep latency was normal. Wake after sleep onset was 36.5 minutes with mild to moderate sleep fragmentation noted. She had moderate PLMS with mild arousals. She had slow-wave sleep at 12.7% and REM sleep at 18.7% with a slightly reduced REM  latency. She had no significant EKG or EEG changes. She was titrated from 5-15 cm and also tried on BiPAP of 16/12 cm to 18/14 cm. She did reasonably well on CPAP of 14 cm. Brief supine REM sleep was achieved. AHI was 0 per hour on the pressure of 14 cm. Based on those test results I prescribed CPAP therapy.   I reviewed her compliance data from 06/11/2014 through 06/22/2014 which is a total of 12 days during which time she used her machine 7 days with percent used days greater than 4 hours of 8%, indicating poor compliance. AHI at 0.1 per hour and 95th percentile of leak at 12.2 L/m, adequate. Pressure at 14 cm with EPR of 3. Average usage of only 1 hour and 26 minutes for all days and 2 hours and 27 minutes 4 days on machine.   I reviewed her compliance data on 06/20/2014 through 07/19/2014 which is a total of 30 days during which time she used her machine only 5 days with percent used days greater than 4 hours of only 3%,  indicating noncompliance, residual AHI at 0.4 per hour and leak acceptable at the same pressure.   I reviewed her compliance data from 07/22/2014 through 08/20/2014 which is a total of 30 days during which time she used her machine 27 days with percent used days greater than 4 hours of 50%, indicating much improved compliance but still suboptimal with an average usage of 3 hours and 33 minutes and residual AHI low at 0.5 per hour and leak acceptable at 15.1 L/m at the 95th percentile. The patient called in December 2015, reporting that she was not using her CPAP machine and wanted to return it. She was advised to come in for follow-up appointment.   Her typical bedtime is reported to be around 9 PM and usual wake time is around 6 AM. Sleep onset typically occurs within 30-60 minutes. She reports feeling marginally rested upon awakening. She wakes up on an average 1 times in the middle of the night and has to go to the bathroom rarely in a typical night. She reports no morning headaches.   She reports occasional excessive daytime somnolence (EDS) and Her Epworth Sleepiness Score (ESS) is 6/24 today. She has not fallen asleep while driving. The patient has been taking a scheduled nap, which is usually after lunch and 1 hour to 1.5 hours long. She denies dreaming in a nap and reports feeling refreshed after a nap. She has been known to snore for the past many years. Snoring is reportedly moderate, but not associated with choking sounds and witnessed apneas. The patient admits to a rare sense of choking or strangling feeling. There is no report of nighttime reflux, with no nighttime cough experienced. The patient has not noted any RLS symptoms and is not known to kick while asleep or before falling asleep. There is no family history of RLS or OSA.   She is a restless sleeper and in the morning, the bed is quite disheveled.    She denies cataplexy, sleep paralysis, hypnagogic or hypnopompic hallucinations, or  sleep attacks. She does not report any vivid dreams, nightmares, dream enactments, or parasomnias, such as sleep talking or sleep walking. The patient has not had a sleep study or a home sleep test.   She consumes 0 caffeinated beverages per day, usually in the form of decaff coffee in the morning.    Her bedroom is usually dark and cool. There is a TV in the  bedroom and usually it is on at night.    Her Past Medical History Is Significant For: Past Medical History:  Diagnosis Date  . Anxiety   . Arthritis    "hands"  . 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 Diabetes mellitus without complication ([place])   . High cholesterol   . HTN (hypertension)   . Malignant neoplasm of breast (female), unspecified site    left  . Osteopenia   . Sleep apnea    uses CPAP nightly    Her Past Surgical History Is Significant For: Past Surgical History:  Procedure Laterality Date  . BREAST LUMPECTOMY Right 1990's  . BREAST LUMPECTOMY WITH RADIOACTIVE SEED LOCALIZATION Left 12/29/2014   Procedure: LEFT BREAST LUMPECTOMY WITH RADIOACTIVE SEED LOCALIZATION;  Surgeon: Fanny Skates, MD;  Location: [facility];  Service: General;  Laterality: Left;  . CATARACT EXTRACTION  07/07/14   left eye  . COMBINED LAPAROSCOPY W/ HYSTEROSCOPY    . MASTECTOMY Right 02/2014  . TOTAL MASTECTOMY Right 02/09/2014   Procedure: RIGHT TOTAL MASTECTOMY;  Surgeon: Adin Hector, MD;  Location: [facility];  Service: General;  Laterality: Right;  . TUBAL LIGATION      Her Family History Is Significant For: Family History  Problem Relation Age of Onset  . Hypertension Father   . CVA Father   . Diabetes Mother   . Heart disease Mother   . Breast cancer Paternal Aunt     dx 51s; deceased 10s  . Breast cancer Paternal Aunt     dx late 54s; deceased 3  . Leukemia Cousin     2 paternal female cousins  . Diabetes Brother   . CVA Brother     Her Social History Is  Significant For: Social History   Social History  . Marital status: Married    Spouse name: N/A  . Number of children: N/A  . Years of education: N/A   Occupational History  . retired Hydrologist    Professor   Social History Main Topics  . Smoking status: Former Smoker    Packs/day: 0.50    Years: 30.00    Types: Cigarettes    Quit date: 05/06/1991  . Smokeless tobacco: Never Used  . Alcohol use No  . Drug use: No  . Sexual activity: Not Asked   Other Topics Concern  . None   Social History Narrative  . None    Her Allergies Are:  No Known Allergies:   Her Current Medications Are:  Outpatient Encounter Prescriptions as of 06/20/2016  Medication Sig  . amLODipine-valsartan (EXFORGE) 5-160 MG per tablet Take 1 tablet by mouth daily.  . metFORMIN (GLUCOPHAGE-XR) 500 MG 24 hr tablet Take 500 mg by mouth every evening.   . metoprolol tartrate (LOPRESSOR) 25 MG tablet Take 25 mg by mouth 2 (two) times daily.   . Multiple Vitamins-Minerals (CENTRUM SILVER ADULT 50+) TABS Take 1 tablet by mouth daily.  . sertraline (ZOLOFT) 50 MG tablet Take 50 mg by mouth daily.  Marland Kitchen triamcinolone cream (KENALOG) 0.1 % Apply 1 application topically 2 (two) times daily.   No facility-administered encounter medications on file as of 06/20/2016.   :  Review of Systems:  Out of a complete 14 point review of systems, all are reviewed and negative with the exception of these symptoms as listed below: Review of Systems  Neurological:       Pt presents today for a follow  up. Pt says that her memory is about the same. Pt is using her cpap but wants to know how often to change supplies. She is using [facility].    Objective:  Neurologic Exam  Physical Exam Physical Examination:   Vitals:   06/20/16 1259  BP: (!) 141/71  Pulse: (!) 57  Resp: 20   General Examination: The patient is a very pleasant 75 y.o. female in no acute distress. She appears well-developed and well-nourished and well groomed. She  is a little quieter today.     HEENT: Normocephalic, atraumatic, pupils are equal, round and reactive to light and accommodation. She has had cataract repairs. She has b/l hearing aids. Extraocular tracking is good without limitation to gaze excursion or nystagmus noted. Normal smooth pursuit is noted. Hearing is grossly intact. Face is symmetric with normal facial animation and normal facial sensation. Speech is clear with no dysarthria noted. There is no hypophonia. There is no lip, neck/head, jaw or voice tremor. Neck is supple with full range of passive and active motion. There are no carotid bruits on auscultation. Oropharynx exam reveals: mild mouth dryness, good dental hygiene and mild airway crowding, due to narrow airway and floppy soft palate. Mallampati is class II. Tongue protrudes centrally and palate elevates symmetrically. Tonsils are 1+ in size/absent.   Chest: Clear to auscultation without wheezing, rhonchi or crackles noted.  Heart: S1+S2+0, regular and normal without murmurs, rubs or gallops noted.   Abdomen: Soft, non-tender and non-distended with normal bowel sounds appreciated on auscultation.  Extremities: There is no pitting edema in the distal lower extremities bilaterally. Pedal pulses are intact.  Skin: Warm and dry without trophic changes noted. There are no varicose veins.  Musculoskeletal: exam reveals no obvious joint deformities, tenderness or joint swelling or erythema.   Neurologically:  Mental status: The patient is awake, alert and oriented in all 4 spheres. Her memory, attention, language and knowledge are appropriate. There is no aphasia, agnosia, apraxia or anomia. Speech is clear with normal prosody and enunciation. Thought process is linear. Mood is congruent and affect is normal.  On 12/09/2014: MMSE: 29/30, AFT: 9/min.  On 06/21/2015: MMSE: 29/30, CDT: 4/4, AFT: 13/min.  On 12/20/2015: MMSE: 29/30, CDT: 4/4, AFT: 9/min.   On 06/20/2016: MMSE: 24/30,  CDT: 4/4, AFT: 7/min.   Cranial nerves are as described above under HEENT exam. In addition, shoulder shrug is normal with equal shoulder height noted. Motor exam: Normal bulk, strength and tone is noted. There is no drift, tremor or rebound. Romberg is negative. Reflexes are 2+ throughout. Fine motor skills are intact with normal finger taps, normal hand movements, normal rapid alternating patting, normal foot taps and normal foot agility.  Cerebellar testing shows no dysmetria or intention tremor on finger to nose testing. Heel to shin is unremarkable bilaterally. There is no truncal or gait ataxia.  Sensory exam is intact to light touch, temperature, vibration and PP in the upper and lower extremities.  Gait, station and balance are unremarkable. No veering to one side is noted. No leaning to one side is noted. Posture is age-appropriate and stance is narrow based. No problems turning are noted. Tandem walk is slightly difficult for her, unchanged.    Assessment and Plan:   In summary, EMMETT ARNTZ is a very pleasant 75 year old female with an underlying medical history of breast cancer on the right, status post lumpectomy, chemotherapy and radiation therapy, hypertension, osteopenia, hypertension, and eczema, who presents for follow-up consultation of her  obstructive sleep apnea and her memory complaints. Her memory scores have been stable in the past, but are less today. Her daughter-in-law does reported an approximately 2 year history of forgetfulness and patient would ask the same questions over and over again and repeat herself. She is advised that we will continue to monitor, but I would like to reinitiate the referral for formal neuropsychological evaluation. She has very good support from all 4 children. She like sodoku puzzles. She worries about dementia, especially since her husband suffered from advanced dementia.  We may consider medication for memory, but she is advised to be better with  her CPAP.  She had a brain MRI with and without contrast on 07/20/2015: IMPRESSION:  Mildly abnormal MRI brain (with and without) demonstrating: 1. Few scattered periventricular and subcortical foci of non-specific gliosis, likely chronic small vessel ischemic disease. No abnormal lesions are seen on post contrast views.  2. No acute findings. She is advised to be fully compliant with CPAP therapy. Her compliance is less good in the last few months.  She is advised to maintain good nutrition and good hydration and maintain enough sleep time. I will see her back in 6 months, sooner if needed. I answered all her questions today and the patient was in agreement with the plan.   I spent 25 minutes in total face-to-face time with the patient, more than 50% of which was spent in counseling and coordination of care, reviewing test results, reviewing medication and discussing or reviewing the diagnosis of OSA and memory loss, the prognosis and treatment options.

## 2016-06-20 NOTE — Patient Instructions (Signed)
Please continue using your CPAP regularly. While your insurance requires that you use CPAP at least 4 hours each night on 70% of the nights, I recommend, that you not skip any nights and use it throughout the night if you can. Getting used to CPAP and staying with the treatment long term does take time and patience and discipline. Untreated obstructive sleep apnea when it is moderate to severe can have an adverse impact on cardiovascular health and raise her risk for heart disease, arrhythmias, hypertension, congestive heart failure, stroke and diabetes. Untreated obstructive sleep apnea causes sleep disruption, nonrestorative sleep, and sleep deprivation. This can have an impact on your day to day functioning and cause daytime sleepiness and impairment of cognitive function, memory loss, mood disturbance, and problems focussing. Using CPAP regularly can improve these symptoms.  We will request a formal neuropsychological test (aka cognitive testing) for your memory complaints. This requires a referral to a trained and licensed neuropsychologist and will be a separate appointment at a different clinic.

## 2016-08-03 ENCOUNTER — Encounter: Payer: Medicare Other | Admitting: Psychology

## 2016-08-03 DIAGNOSIS — Z029 Encounter for administrative examinations, unspecified: Secondary | ICD-10-CM

## 2016-09-25 ENCOUNTER — Other Ambulatory Visit: Payer: Self-pay | Admitting: Oncology

## 2016-12-19 ENCOUNTER — Ambulatory Visit: Payer: Medicare Other | Admitting: Neurology

## 2017-01-15 ENCOUNTER — Encounter: Payer: Self-pay | Admitting: Neurology

## 2017-01-15 ENCOUNTER — Ambulatory Visit (INDEPENDENT_AMBULATORY_CARE_PROVIDER_SITE_OTHER): Payer: Medicare Other | Admitting: Neurology

## 2017-01-15 VITALS — BP 140/63 | HR 61 | Ht <= 58 in | Wt 118.0 lb

## 2017-01-15 DIAGNOSIS — Z9989 Dependence on other enabling machines and devices: Secondary | ICD-10-CM

## 2017-01-15 DIAGNOSIS — G4733 Obstructive sleep apnea (adult) (pediatric): Secondary | ICD-10-CM

## 2017-01-15 DIAGNOSIS — R419 Unspecified symptoms and signs involving cognitive functions and awareness: Secondary | ICD-10-CM | POA: Diagnosis not present

## 2017-01-15 NOTE — Progress Notes (Signed)
Subjective:    Patient ID: Karen [name] is a 76 y.o. female.  HPI     Interim history:  Karen [name] is a very pleasant 76 year old right-handed woman with an underlying medical history of breast cancer on the right, status post lumpectomy, chemotherapy and radiation therapy, hypertension, osteopenia, hypertension, and eczema, who presents for follow-up consultation of her obstructive sleep apnea, on treatment with CPAP, and her cognitive complaints. The patient is unaccompanied today. I last saw her on 06/20/2016, at which time she reported doing about the same. She felt that her memory was stable. She had no actual issues with CPAP but was not fully compliant with it either, tendency to pull off the mask in the middle of the night. She did not have neuropsychological testing done. No sustained symptoms of depression were reported. She was agreeable to pursuing neuropsychological evaluation, I made a referral in that regard, she no showed for an appointment with Dr. Bonita Quin on 08/03/2016.  Today, 01/15/2017 (all dictated new, as well as above notes, some dictation done in note pad or Word, outside of chart, may appear as copied):   I reviewed her CPAP compliance data from 12/11/2016 through 01/09/2017 which is a total of 30 days, during which time she used her machine only 21 days with percent used days greater than 4 hours at 50%, indicating suboptimal compliance with an average usage for all days on treatment of 4 hours and 43 minutes, residual AHI 1.9 per hour, leak high with the 95th percentile of 38.1 L/m on a pressure of 13 cm with EPR of 3. She reports that she may not always put the mask back on after going to the bathroom at night.  She still feels forgetful, worse after her husband's passing. Has 4 kids and 4 GCs. She has not had any recent issues driving, no recent medication changes are reported, no recent illnesses reported. She reports that her children would like for her to move in  with one of them. She says that she would consider this if she gets worse.   The patient's allergies, current medications, family history, past medical history, past social history, past surgical history and problem list were reviewed and updated as appropriate.   Previously (copied from previous notes for reference):   I saw her on 12/20/2015, at which time she reported doing overall fairly well. She had recently lost her husband in January 2017 after a prolonged illness and progressive decline. She was living alone. She had good support from her 3 sons and her daughter. She was driving but did report occasional issues with getting easily confused while driving. She had an MRI brain with and without contrast in January 2017 and we reviewed the results: IMPRESSION:   Mildly abnormal MRI brain (with and without) demonstrating: 1. Few scattered periventricular and subcortical foci of non-specific gliosis, likely chronic small vessel ischemic disease. No abnormal lesions are seen on post contrast views.    2. No acute findings. She had new hearing aids.  I suggested we proceed with formal neuropsychological evaluation. She did not have the test or consultation done and it appears she canceled an appointment with Dr. Valentina Shaggy on 03/08/2016.   I reviewed her CPAP compliance data from 05/18/2016 through 06/16/2016 which is a total of 30 days, during which time she used her CPAP 24 days with percent used days greater than 4 hours at 53%, indicating suboptimal compliance with an average usage of 4 hours and 46 minutes for days on  treatment, residual AHI 2.3 per hour, leak on the high side with the 95th percentile at 28.7 L/m on a pressure of 13 cm with EPR of 3.   I saw her on 06/21/2015, at which time she reported feeling fairly stable, her husband was at [facility] since October 2016, and she had sitters and their four children were taking turns in staying overnight. She was still worried about her memory  issues, including forgetfulness, misplacing things, and trouble focusing and paying attention. Her scores were stable. Her CPAP compliance was suboptimal.   12/20/2015: I reviewed her CPAP compliance data from 11/19/15 to 12/18/2015, which is a total of 30 days, during which time she used her machine 27 days with percent used days greater than 4 hours at 63%, indicating suboptimal compliance but improved from 6 months ago. Average usage of 4 hours and 22 minutes, residual AHI 0.8 per hour, leak acceptable for the most part but 95th percentile of 24.3 L/m on a pressure of 13 cm with EPR of 3.   Of note, the patient no showed for an appointment on 06/10/2015. I last saw her on 12/09/2014, at which time she reported, that she had to skip nights with CPAP d/t her husband's illness and being in the hospital with him. Overall she had done much better than in the past with her CPAP and actually felt better. She had needle Bx on the L breast. She has support from her 4 kids. She c/o memory loss, mostly forgetfulness and her MMSE was 29/30, AFT was 9/min at the time. I suggested we monitor her memory. I felt that stress was contributing to her memory issues.   I reviewed her CPAP compliance data from 05/10/2015 through 06/08/2015 which is a total of 30 days during which time she used her machine 27 days with percent used days greater than 4 hours of only 47%, indicating suboptimal compliance with an average usage of 4 hours only, residual AHI low at 0.7 per hour, leak at times high with the 95th percentile at 25.3 L/m on a pressure of 13 cm with EPR of 3. More recently, 05/17/2015 through 06/15/2015 her compliance percentage improved to 53%. Nevertheless, it is still suboptimal.   I saw her on 08/25/2014, at which time she reported trouble tolerating the mask and the pressure. She was waking up with dry mouth. The strap of the mask was leaving marks on her face. She had gone to her DME representative because of her  severe mouth dryness and was advised to increase the humidity, but then she had too much moisture. All in all, she was struggling. However, she did endorse sleeping better compared to before her sleep apnea diagnosis. She had had some medication changes including being on sertraline and low-dose metformin. I suggested we reduced the pressure some for better tolerance and a reduced at 13.   Of note, the patient no showed for an appointment on 11/23/2014.   I reviewed her CPAP compliance data from 11/08/2014 through 12/07/2014 which is a total of 30 days during which time she used her machine 26 days with percent used days greater than 4 hours at 77%, indicating adequate are good compliance, average usage of 4 hours and 53 minutes for all nights, residual AHI low at 0.1 per hour, leak acceptable with occasional increase in the 95th percentile of leak at 25.4 L/m on a pressure of 13 with EPR of 3.   I first met her on 05/05/2013 at the request of her  primary care physician, at which time she complained of nonrestorative sleep, sleep disruption, snoring, and daytime somnolence. I invited her back for sleep study. She had a baseline sleep study on 03/08/2014 followed by a CPAP titration study on 05/03/2014. Her baseline sleep efficiency was 84.5% with a prolonged sleep latency of 29.5 minutes and wake after sleep onset of 39.5 minutes with mild to moderate sleep fragmentation noted. She had an increased percentage of slow-wave sleep and a normal percentage of REM sleep with a mildly reduced REM latency. She had mild PLMS with minimal arousals. She had mild intermittent snoring. Total AHI was 14.8 per hour, rising to 48.4 per hour during REM sleep. Average oxygen saturation was 92% with a nadir of 76% in REM sleep. Time below 88% saturation was 20 minutes. Based on these test results I asked her to return for a full night CPAP titration study which she had on 05/03/2014. Sleep efficiency was 90.5%. Sleep latency  was normal. Wake after sleep onset was 36.5 minutes with mild to moderate sleep fragmentation noted. She had moderate PLMS with mild arousals. She had slow-wave sleep at 12.7% and REM sleep at 18.7% with a slightly reduced REM latency. She had no significant EKG or EEG changes. She was titrated from 5-15 cm and also tried on BiPAP of 16/12 cm to 18/14 cm. She did reasonably well on CPAP of 14 cm. Brief supine REM sleep was achieved. AHI was 0 per hour on the pressure of 14 cm. Based on those test results I prescribed CPAP therapy.   I reviewed her compliance data from 06/11/2014 through 06/22/2014 which is a total of 12 days during which time she used her machine 7 days with percent used days greater than 4 hours of 8%, indicating poor compliance. AHI at 0.1 per hour and 95th percentile of leak at 12.2 L/m, adequate. Pressure at 14 cm with EPR of 3. Average usage of only 1 hour and 26 minutes for all days and 2 hours and 27 minutes 4 days on machine.   I reviewed her compliance data on 06/20/2014 through 07/19/2014 which is a total of 30 days during which time she used her machine only 5 days with percent used days greater than 4 hours of only 3%, indicating noncompliance, residual AHI at 0.4 per hour and leak acceptable at the same pressure.   I reviewed her compliance data from 07/22/2014 through 08/20/2014 which is a total of 30 days during which time she used her machine 27 days with percent used days greater than 4 hours of 50%, indicating much improved compliance but still suboptimal with an average usage of 3 hours and 33 minutes and residual AHI low at 0.5 per hour and leak acceptable at 15.1 L/m at the 95th percentile. The patient called in December 2015, reporting that she was not using her CPAP machine and wanted to return it. She was advised to come in for follow-up appointment.   Her typical bedtime is reported to be around 9 PM and usual wake time is around 6 AM. Sleep onset typically occurs  within 30-60 minutes. She reports feeling marginally rested upon awakening. She wakes up on an average 1 times in the middle of the night and has to go to the bathroom rarely in a typical night. She reports no morning headaches.   She reports occasional excessive daytime somnolence (EDS) and Her Epworth Sleepiness Score (ESS) is 6/24 today. She has not fallen asleep while driving. The patient has been taking  a scheduled nap, which is usually after lunch and 1 hour to 1.5 hours long. She denies dreaming in a nap and reports feeling refreshed after a nap. She has been known to snore for the past many years. Snoring is reportedly moderate, but not associated with choking sounds and witnessed apneas. The patient admits to a rare sense of choking or strangling feeling. There is no report of nighttime reflux, with no nighttime cough experienced. The patient has not noted any RLS symptoms and is not known to kick while asleep or before falling asleep. There is no family history of RLS or OSA.   She is a restless sleeper and in the morning, the bed is quite disheveled.    She denies cataplexy, sleep paralysis, hypnagogic or hypnopompic hallucinations, or sleep attacks. She does not report any vivid dreams, nightmares, dream enactments, or parasomnias, such as sleep talking or sleep walking. The patient has not had a sleep study or a home sleep test.   She consumes 0 caffeinated beverages per day, usually in the form of decaff coffee in the morning.    Her bedroom is usually dark and cool. There is a TV in the bedroom and usually it is on at night.    Her Past Medical History Is Significant For: Past Medical History:  Diagnosis Date  . Anxiety   . Arthritis    "hands"  . 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 Diabetes mellitus without complication ([place])   . High cholesterol   . HTN (hypertension)   . Malignant neoplasm of breast (female), unspecified site     left  . Osteopenia   . Sleep apnea    uses CPAP nightly    Her Past Surgical History Is Significant For: Past Surgical History:  Procedure Laterality Date  . BREAST LUMPECTOMY Right 1990's  . BREAST LUMPECTOMY WITH RADIOACTIVE SEED LOCALIZATION Left 12/29/2014   Procedure: LEFT BREAST LUMPECTOMY WITH RADIOACTIVE SEED LOCALIZATION;  Surgeon: Fanny Skates, MD;  Location: [facility];  Service: General;  Laterality: Left;  . CATARACT EXTRACTION  07/07/14   left eye  . COMBINED LAPAROSCOPY W/ HYSTEROSCOPY    . MASTECTOMY Right 02/2014  . TOTAL MASTECTOMY Right 02/09/2014   Procedure: RIGHT TOTAL MASTECTOMY;  Surgeon: Adin Hector, MD;  Location: [facility];  Service: General;  Laterality: Right;  . TUBAL LIGATION      Her Family History Is Significant For: Family History  Problem Relation Age of Onset  . Hypertension Father   . CVA Father   . Diabetes Mother   . Heart disease Mother   . Breast cancer Paternal Aunt        dx 43s; deceased 64s  . Breast cancer Paternal Aunt        dx late 67s; deceased 62  . Leukemia Cousin        2 paternal female cousins  . Diabetes Brother   . CVA Brother     Her Social History Is Significant For: Social History   Social History  . Marital status: Married    Spouse name: N/A  . Number of children: N/A  . Years of education: N/A   Occupational History  . retired Hydrologist    Professor   Social History Main Topics  . Smoking status: Former Smoker    Packs/day: 0.50    Years: 30.00    Types: Cigarettes    Quit date: 05/06/1991  . Smokeless  tobacco: Never Used  . Alcohol use No  . Drug use: No  . Sexual activity: Not Asked   Other Topics Concern  . None   Social History Narrative  . None    Her Allergies Are:  No Known Allergies:   Her Current Medications Are:  Outpatient Encounter Prescriptions as of 01/15/2017  Medication Sig  . amLODipine-valsartan (EXFORGE) 5-160 MG per tablet Take 1 tablet by mouth daily.   . metFORMIN (GLUCOPHAGE-XR) 500 MG 24 hr tablet Take 500 mg by mouth every evening.   . metoprolol tartrate (LOPRESSOR) 25 MG tablet Take 25 mg by mouth 2 (two) times daily.   . Multiple Vitamins-Minerals (CENTRUM SILVER ADULT 50+) TABS Take 1 tablet by mouth daily.  . sertraline (ZOLOFT) 50 MG tablet Take 50 mg by mouth daily.  Marland Kitchen triamcinolone cream (KENALOG) 0.1 % Apply 1 application topically 2 (two) times daily.   No facility-administered encounter medications on file as of 01/15/2017.   :  Review of Systems:  Out of a complete 14 point review of systems, all are reviewed and negative with the exception of these symptoms as listed below: Review of Systems  Neurological:       Pt presents today to discuss her cpap. Pt has no complaints today.    Objective:  Neurological Exam  Physical Exam Physical Examination:   Vitals:   01/15/17 0921  BP: 140/63  Pulse: 61    General Examination: The patient is a very pleasant 76 y.o. female in no acute distress. She appears well-developed and well-nourished and well groomed.   HEENT: Normocephalic, atraumatic, pupils are equal, round and reactive to light and accommodation. She has had cataract repairs. She has b/l hearing aids. Extraocular tracking is good without limitation to gaze excursion or nystagmus noted. Normal smooth pursuit is noted. Hearing is grossly intact. Face is symmetric with normal facial animation and normal facial sensation. Speech is clear with no dysarthria noted. There is no hypophonia. There is no lip, neck/head, jaw or voice tremor. Neck is supple with full range of passive and active motion. There are no carotid bruits on auscultation. Oropharynx exam reveals: mild mouth dryness, good dental hygiene and mild airway crowding, due to narrow airway and floppy soft palate. Mallampati is class II. Tongue protrudes centrally and palate elevates symmetrically. Tonsils are 1+ in size/absent.   Chest: Clear to auscultation  without wheezing, rhonchi or crackles noted.  Heart: S1+S2+0, regular and normal without murmurs, rubs or gallops noted.   Abdomen: Soft, non-tender and non-distended with normal bowel sounds appreciated on auscultation.  Extremities: There is no pitting edema in the distal lower extremities bilaterally. Pedal pulses are intact.  Skin: Warm and dry without trophic changes noted. There are no varicose veins.  Musculoskeletal: exam reveals no obvious joint deformities, tenderness or joint swelling or erythema.   Neurologically:  Mental status: The patient is awake, alert and oriented in all 4 spheres. Her memory, attention, language and knowledge are okay, stable. There is no aphasia, agnosia, apraxia or anomia. Speech is clear with normal prosody and enunciation. Thought process is linear. Mood is congruent and affect is somewhat blunted.   On 12/09/2014: MMSE: 29/30, AFT: 9/min.  On 06/21/2015: MMSE: 29/30, CDT: 4/4, AFT: 13/min.  On 12/20/2015: MMSE: 29/30, CDT: 4/4, AFT: 9/min.   On 06/20/2016: MMSE: 24/30, CDT: 4/4, AFT: 7/min.   Cranial nerves are as described above under HEENT exam. In addition, shoulder shrug is normal with equal shoulder height noted. Motor exam:  Normal bulk, strength and tone is noted. There is no drift, tremor or rebound. Reflexes are 2+ throughout. Fine motor skills are grossly intact.  Cerebellar testing shows no dysmetria or intention tremor on finger to nose testing. Heel to shin is unremarkable bilaterally. There is no truncal or gait ataxia.  Sensory exam is intact to light touch, temperature, vibration and PP in the upper and lower extremities.  Gait, station and balance are unremarkable. No veering to one side is noted. No leaning to one side is noted. Posture is age-appropriate and stance is narrow based. No problems turning are noted.   Assessment and Plan:   In summary, Karen [name] is a very pleasant 75 year old female with an underlying  medical history of breast cancer on the right, status post lumpectomy, chemotherapy and radiation therapy, hypertension, osteopenia, hypertension, and eczema, who presents for follow-up consultation of her obstructive sleep apnea and her memory complaints. Her memory scores have been stable in the past, less in 12/17 and  per patient, she was reported to have had at least 2-1/2 year history of forgetfulness and tendency to repeat herself and asked the same questions over again. I had reviewed referred her to neuropsychology, she missed an appointment in January 2018 and is encouraged to call year Dr. Corliss Blacker office to reschedule her appointment. She is furthermore encouraged to be better with her CPAP compliance and not to skip any nights and try to keep it on all night. She has indicated previously and again today that her children would like for her to move in with one of them and she has not seriously considered this. She is encouraged to give it a thought as she may do better with her overall health and social interactions if she were to have day-to-day interaction with her family. She may do better with her CPAP compliance as well. I suggested a one-year checkup, sooner as needed and also if indicated by her neuropsychological test results. I answered all her questions today and she was in agreement.  Of note, she had a brain MRI with and without contrast on 07/20/2015: IMPRESSION:  Mildly abnormal MRI brain (with and without) demonstrating: 1. Few scattered periventricular and subcortical foci of non-specific gliosis, likely chronic small vessel ischemic disease. No abnormal lesions are seen on post contrast views.  2. No acute findings. She is advised to maintain good nutrition and good hydration and maintain enough sleep time. I spent 20 minutes in total face-to-face time with the patient, more than 50% of which was spent in counseling and coordination of care, reviewing test results,  reviewing medication and discussing or reviewing the diagnosis of OSA and memory loss, the prognosis and treatment options. Pertinent laboratory and imaging test results that were available during this visit with the patient were reviewed by me and considered in my medical decision making (see chart for details).

## 2017-01-15 NOTE — Patient Instructions (Addendum)
Please continue using your CPAP regularly. While your insurance requires that you use CPAP at least 4 hours each night on 70% of the nights, I recommend, that you not skip any nights and use it throughout the night if you can. Getting used to CPAP and staying with the treatment long term does take time and patience and discipline. Untreated obstructive sleep apnea when it is moderate to severe can have an adverse impact on cardiovascular health and raise her risk for heart disease, arrhythmias, hypertension, congestive heart failure, stroke and diabetes. Untreated obstructive sleep apnea causes sleep disruption, nonrestorative sleep, and sleep deprivation. This can have an impact on your day to day functioning and cause daytime sleepiness and impairment of cognitive function, memory loss, mood disturbance, and problems focussing. Using CPAP regularly can improve these symptoms.  We will see you back in 12 months for sleep apnea check up.   You missed an appointment in January with Dr. Bonita Quin. Please call and ask for another appointment: [phone].

## 2017-02-22 ENCOUNTER — Telehealth: Payer: Self-pay | Admitting: Neurology

## 2017-02-22 DIAGNOSIS — G4733 Obstructive sleep apnea (adult) (pediatric): Secondary | ICD-10-CM

## 2017-02-22 DIAGNOSIS — Z9989 Dependence on other enabling machines and devices: Principal | ICD-10-CM

## 2017-02-22 DIAGNOSIS — R419 Unspecified symptoms and signs involving cognitive functions and awareness: Secondary | ICD-10-CM

## 2017-02-22 NOTE — Telephone Encounter (Signed)
Karen [name] (daughter in law not listed on DPR) patient was there with her on speaker phone spoke to me directly to give me consent to speak with Colletta Maryland due to her not hearing well.  Patient is needing a new/updated referral to Dr. Corliss Blacker office due to the other one being expired (they only keep them for 3 months).  FYI

## 2017-02-22 NOTE — Telephone Encounter (Signed)
Received VO from Dr. Rexene Alberts to reorder the neuro psych consult with Dr. Si Raider. Order placed.

## 2017-02-22 NOTE — Addendum Note (Signed)
Addended by: Lester Hildale A on: 02/22/2017 05:21 PM   Modules accepted: Orders

## 2017-02-23 ENCOUNTER — Encounter: Payer: Self-pay | Admitting: Psychology

## 2017-05-14 ENCOUNTER — Encounter: Payer: Self-pay | Admitting: Psychology

## 2017-05-14 ENCOUNTER — Ambulatory Visit (INDEPENDENT_AMBULATORY_CARE_PROVIDER_SITE_OTHER): Payer: Medicare Other | Admitting: Psychology

## 2017-05-14 ENCOUNTER — Ambulatory Visit: Payer: Medicare Other | Admitting: Psychology

## 2017-05-14 DIAGNOSIS — R413 Other amnesia: Secondary | ICD-10-CM

## 2017-05-14 NOTE — Progress Notes (Signed)
   Neuropsychology Note  Karen [name] came in today for 1 hour of neuropsychological testing with technician, Milana Kidney, BS, under the supervision of Dr. Macarthur Critchley. The patient did not appear overtly distressed by the testing session, per behavioral observation or via self-report to the technician. Rest breaks were offered. Karen [name] will return within 2 weeks for a feedback session with Dr. Si Raider at which time her test performances, clinical impressions and treatment recommendations will be reviewed in detail. The patient understands she can contact our office should she require our assistance before this time.  Full report to follow.
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
## 2017-05-17 ENCOUNTER — Encounter: Payer: Self-pay | Admitting: Neurology
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
## 2017-06-11 ENCOUNTER — Encounter: Payer: Self-pay | Admitting: Psychology

## 2017-06-11 ENCOUNTER — Ambulatory Visit (INDEPENDENT_AMBULATORY_CARE_PROVIDER_SITE_OTHER): Payer: Medicare Other | Admitting: Psychology

## 2017-06-11 DIAGNOSIS — F0280 Dementia in other diseases classified elsewhere without behavioral disturbance: Secondary | ICD-10-CM

## 2017-06-11 DIAGNOSIS — G301 Alzheimer's disease with late onset: Secondary | ICD-10-CM

## 2017-06-11 NOTE — Patient Instructions (Signed)
Description of Test Results:  Premorbid verbal intellectual abilities were estimated to have been within the average range based on a test of word reading. Psychomotor processing speed was average. Auditory attention and working memory were average. Visual-spatial construction was high average on her drawn copy of a complex geometric figure. Language abilities were significantly impaired. Specifically, confrontation naming was severely impaired, and semantic verbal fluency was impaired. Auditory comprehension of complex ideational material was severely impaired. On a simulated bill payment task requiring aspects of both receptive and expressive language, she performed in the impaired range. With regard to verbal memory, encoding and acquisition of non-contextual information (i.e., word list) was severely impaired with a flat learning curve. After a brief distracter task, free recall was impaired (2/9 items recalled). After a delay, free recall was impaired (1/9 items recalled). Cued recall was severely impaired (1/9 items recalled). Performance on a yes/no recognition task was impaired. Forced choice recognition also was impaired. On another verbal memory test, encoding and acquisition of contextual auditory information (i.e., short story) was severely impaired. After a delay, free recall was severely impaired. With regard to non-verbal memory, delayed free recall of visual information was impaired. Executive functioning was mostly impaired. Mental flexibility and set-shifting were impaired; she was unable to complete Trails B. Verbal fluency with phonemic search restrictions was borderline impaired. Verbal abstract reasoning was average. Performance on a clock drawing task was impaired. On a self-report measure of mood, the patient's responses were indicative of possible mild depression. Symptoms endorsed included: feeling her life is empty, not feeling in good spirits most of the time, not feeling happy most of  the time, and preferring to stay home. On a self-report measure of anxiety, the patient endorsed clinically significant generalized anxiety characterized by feeling nervous most days, inability to control worrying, excessive worrying, trouble relaxing, and becoming easily annoyed or irritable.   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 Results of the current cognitive evaluation reveal several areas of impairment, including both verbal and non-verbal memory, language (i.e., confrontation naming, semantic fluency, auditory comprehension - all impaired beyond expectation even when demographic/educational background is considered), and executive functioning. Additionally, there is evidence that her cognitive deficits are interfering with her ability to manage complex tasks, such as managing finances and driving. As such, diagnostic criteria for a dementia syndrome are met. The patient's cognitive profile is suggestive of medial-temporal lobe involvement. Alzheimer's disease is the most likely etiology, given her cognitive profile and clinical features. She also demonstrates a high level of anxiety and milder adjustment-related depression; however, these issues alone would not cause the level of cognitive impairment she is currently demonstrating nor would they explain her cognitive profile on testing. Her anxiety and depression are, however, possibly exacerbating underlying cognitive impairment. I would characterize her dementia as mild at this time. She is not exhibiting behavioral disturbance or psychosis, but her cognitive impairments in the areas described earlier are quite profound.  Recommendations: Based on the findings of the present evaluation, the following recommendations are offered:  1. The patient is considered an appropriate candidate for cholinesterase inhibitor therapy, from a  neuropsychological perspective. She will follow up with Dr. Rexene Alberts about this. 2. Based on these testing results and informant report of current functioning, it is my opinion that the patient should no longer independently manage instrumental ADLs including driving, medications, finances, cooking and appointments. I believe she requires assistance with all these tasks. Based on  her overall performance and her performance on a specific cognitive test highly correlated with driving ability, it is my recommendation that she discontinue driving at this time. Medications should be managed for her and ideally administered to her daily. Appointments should be managed for her, and she should be accompanied by a family member to all important appointments. Finances/bills should be managed with or for her. Healthcare and financial PoA should be put in place if they are not already. Given all these recommendations for increased assistance/support, it will likely be best for the patient to move in with a family member or assisted living community. 3. The patient's family is referred to the Alzheimer's Association ([employer]) for additional resources and support. They may wish to seek additional resources through [employer]. 4. The patient should continue to participate in activities which provide mental stimulation, safe cardiovascular exercise, and social interaction. She would likely benefit from attending programs at a senior enrichment center or through [employer]. This would also help provide structure to her day. A regular schedule is highly recommended in order to reduce confusion and enhance day to day functioning. 5. She may benefit from an increase in dosage of Zoloft given the significant anxiety she is experiencing. However, it is also possible that once she has more assistance in daily life she will experience less stress and anxiety. 6. Neuropsychological re-assessment  could be considered in one year in order to monitor cognitive status, track progression of symptoms and further assist with treatment planning.

## 2017-06-26 ENCOUNTER — Telehealth: Payer: Self-pay

## 2017-06-26 NOTE — Telephone Encounter (Signed)
I called pt. It appears that she has not used her cpap since November. She reports that she has used it a couple nights ago. I asked pt to bring her cpap to the visit with Dr. Rexene Alberts tomorrow, to which pt is agreeable.

## 2017-06-27 ENCOUNTER — Encounter: Payer: Self-pay | Admitting: Neurology

## 2017-06-27 ENCOUNTER — Ambulatory Visit: Payer: Medicare Other | Admitting: Neurology

## 2017-06-27 VITALS — BP 133/71 | HR 68 | Ht <= 58 in | Wt 118.0 lb

## 2017-06-27 DIAGNOSIS — Z9989 Dependence on other enabling machines and devices: Secondary | ICD-10-CM | POA: Diagnosis not present

## 2017-06-27 DIAGNOSIS — F0280 Dementia in other diseases classified elsewhere without behavioral disturbance: Secondary | ICD-10-CM

## 2017-06-27 DIAGNOSIS — G301 Alzheimer's disease with late onset: Secondary | ICD-10-CM

## 2017-06-27 DIAGNOSIS — G4733 Obstructive sleep apnea (adult) (pediatric): Secondary | ICD-10-CM

## 2017-06-27 MED ORDER — DONEPEZIL HCL 5 MG PO TABS: 5.0000 mg | ORAL_TABLET | Freq: Every day | ORAL | 5 refills | Status: DC

## 2017-06-27 NOTE — Patient Instructions (Addendum)
Your history, exam, and recent cognitive testing in November 2018 support the diagnosis of Alzheimer's disease. I would like for you to be proactive with your living situation. A consistent and stable living environment and schedule as well as safe environment will be key for you.  I would like for you to be consistent with her CPAP usage as well. You have done better in the past.   I would like for you to talk to your family about long-term living situation. I do not believe that you will be completely safe to live by yourself at this time and certainly going into the future. Please do not drive any longer.   we will start you on memory medication: Aricept (generic name: donepezil) 5 mg: take one pill each evening. Common side effects may include dry eyes, dry mouth, confusion, low pulse, low blood pressure, also GI related side effects (nausea, vomiting, diarrhea, constipation), headaches; rare side effects may include hallucinations and seizures.   We will to a three-month follow-up with one of our nurse practitioners to see if we can increase the dose of your Aricept at the time to 10 mg daily.  Please try to exercise on a regular basis, stay well hydrated, eat nutritious food and snacks.  Please call us back for the dose of Pravastatin you are on as well as the amlodipine/valsartan. You may be off the Valsartan and just on Amlodipine at this time, we would like to update med list correctly.

## 2017-06-27 NOTE — Progress Notes (Signed)
Subjective:    Patient ID: Karen [name] is a 76 y.o. female.  HPI     Interim history:   Karen [name] is a very pleasant 76 year old right-handed woman with an underlying medical history of breast cancer on the right, status post lumpectomy, chemotherapy and radiation therapy, hypertension, osteopenia, hypertension, and eczema, who presents for follow-up consultation of her memory loss and sleep apnea. The patient is accompanied by her oldest daughter-in-law again today. I last saw her 01/15/2017, at which time she was about 50% with her CPAP compliance. She had missed an appointment for cognitive testing with Dr. Bonita Quin. She was encouraged to reschedule that appointment.  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 Results of the current cognitive evaluation reveal several areas of impairment, including both verbal and non-verbal memory, language (i.e., confrontation naming, semantic fluency, auditory comprehension - all impaired beyond expectation even when demographic/educational background is considered), and executive functioning. Additionally, there is evidence that her cognitive deficits are interfering with her ability to manage complex tasks, such as managing finances and driving. As such, diagnostic criteria for a dementia syndrome are met. The patient's cognitive profile is suggestive of medial-temporal lobe involvement. Alzheimer's disease is the most likely etiology, given her cognitive profile and clinical features. She also demonstrates a high level of anxiety and milder adjustment-related depression; however, these issues alone would not cause the level of cognitive impairment she is currently demonstrating nor would they  explain her cognitive profile on testing. Her anxiety and depression are, however, possibly exacerbating underlying cognitive impairment. I would characterize her dementia as mild at this time. She is not exhibiting behavioral disturbance or psychosis, but her cognitive impairments in the areas described earlier are quite profound.   Recommendations: Based on the findings of the present evaluation, the following recommendations are offered:   1. The patient is considered an appropriate candidate for cholinesterase inhibitor therapy, from a neuropsychological perspective. She will follow up with Dr. Rexene Alberts about this. 2. Based on these testing results and informant report of current functioning, it is my opinion that the patient should no longer independently manage instrumental ADLs including driving, medications, finances, cooking and appointments. I believe she requires assistance with all these tasks. Based on her overall performance and her performance on a specific cognitive test highly correlated with driving ability, it is my recommendation that she discontinue driving at this time. Medications should be managed for her and ideally administered to her daily. Appointments should be managed for her, and she should be accompanied by a family member to all important appointments. Finances/bills should be managed with or for her. Healthcare and financial PoA should be put in place if they are not already. Given all these recommendations for increased assistance/support, it will likely be best for the patient to move in with a family member or assisted living community. 3. The patient's family is referred to the [employer] ([employer]) for additional resources and support. They may wish to seek additional resources through [employer]. 4. The patient should continue to participate in activities which provide mental stimulation, safe cardiovascular exercise, and social  interaction. She would likely benefit from attending programs at a senior enrichment center or through [employer]. This would also help provide structure to her day. A  regular schedule is highly recommended in order to reduce confusion and enhance day to day functioning. 5. She may benefit from an increase in dosage of Zoloft given the significant anxiety she is experiencing. However, it is also possible that once she has more assistance in daily life she will experience less stress and anxiety. 6. Neuropsychological re-assessment could be considered in one year in order to monitor cognitive status, track progression of symptoms and further assist with treatment planning. >>  Today, 06/27/2017 (all dictated new, as well as above notes, some dictation done in note pad or Word, outside of chart, may appear as copied):   I was not able to review her most recent compliance data. It is possible that she has not been using CPAP since November. She reports that sometimes she forgets a piece from the machine and does not know where she put it. Her daughter-in-law indicates that she has a tendency to misplace parts of the CPAP machine. In the month of October through early November she had mediocre compliance. She reports that her memory is getting worse. She has not been able to decide where she wants to live long term. Her daughter-in-law reports that there have been some discussion about this in the family. Patient has 4 sons and 1 daughter. She had a change in her cholesterol medication from simvastatin to pravastatin. She's not fully sure about the dose. She is no longer on valsartan and is not sure if she is also on amlodipine or not.  The patient's allergies, current medications, family history, past medical history, past social history, past surgical history and problem list were reviewed and updated as appropriate.    Previously (copied from previous notes for reference):   I reviewed  her CPAP compliance data from 12/11/2016 through 01/09/2017 which is a total of 30 days, during which time she used her machine only 21 days with percent used days greater than 4 hours at 50%, indicating suboptimal compliance with an average usage for all days on treatment of 4 hours and 43 minutes, residual AHI 1.9 per hour, leak high with the 95th percentile of 38.1 L/m on a pressure of 13 cm with EPR of 3.   I saw her on 06/20/2016, at which time she reported doing about the same. She felt that her memory was stable. She had no actual issues with CPAP but was not fully compliant with it either, tendency to pull off the mask in the middle of the night. She did not have neuropsychological testing done. No sustained symptoms of depression were reported. She was agreeable to pursuing neuropsychological evaluation, I made a referral in that regard, she no showed for an appointment with Dr. Bonita Quin on 08/03/2016.   I saw her on 12/20/2015, at which time she reported doing overall fairly well. She had recently lost her husband in January 2017 after a prolonged illness and progressive decline. She was living alone. She had good support from her 3 sons and her daughter. She was driving but did report occasional issues with getting easily confused while driving. She had an MRI brain with and without contrast in January 2017 and we reviewed the results: IMPRESSION:   Mildly abnormal MRI brain (with and without) demonstrating: 1. Few scattered periventricular and subcortical foci of non-specific gliosis, likely chronic small vessel ischemic disease. No abnormal lesions are seen on post contrast views.    2. No acute findings. She had new hearing aids.  I suggested we proceed with formal neuropsychological evaluation.  She did not have the test or consultation done and it appears she canceled an appointment with Dr. Valentina Shaggy on 03/08/2016.   I reviewed her CPAP compliance data from 05/18/2016 through 06/16/2016  which is a total of 30 days, during which time she used her CPAP 24 days with percent used days greater than 4 hours at 53%, indicating suboptimal compliance with an average usage of 4 hours and 46 minutes for days on treatment, residual AHI 2.3 per hour, leak on the high side with the 95th percentile at 28.7 L/m on a pressure of 13 cm with EPR of 3.   I saw her on 06/21/2015, at which time she reported feeling fairly stable, her husband was at [facility] since October 2016, and she had sitters and their four children were taking turns in staying overnight. She was still worried about her memory issues, including forgetfulness, misplacing things, and trouble focusing and paying attention. Her scores were stable. Her CPAP compliance was suboptimal.   12/20/2015: I reviewed her CPAP compliance data from 11/19/15 to 12/18/2015, which is a total of 30 days, during which time she used her machine 27 days with percent used days greater than 4 hours at 63%, indicating suboptimal compliance but improved from 6 months ago. Average usage of 4 hours and 22 minutes, residual AHI 0.8 per hour, leak acceptable for the most part but 95th percentile of 24.3 L/m on a pressure of 13 cm with EPR of 3.   Of note, the patient no showed for an appointment on 06/10/2015. I last saw her on 12/09/2014, at which time she reported, that she had to skip nights with CPAP d/t her husband's illness and being in the hospital with him. Overall she had done much better than in the past with her CPAP and actually felt better. She had needle Bx on the L breast. She has support from her 4 kids. She c/o memory loss, mostly forgetfulness and her MMSE was 29/30, AFT was 9/min at the time. I suggested we monitor her memory. I felt that stress was contributing to her memory issues.   I reviewed her CPAP compliance data from 05/10/2015 through 06/08/2015 which is a total of 30 days during which time she used her machine 27 days with percent used days  greater than 4 hours of only 47%, indicating suboptimal compliance with an average usage of 4 hours only, residual AHI low at 0.7 per hour, leak at times high with the 95th percentile at 25.3 L/m on a pressure of 13 cm with EPR of 3. More recently, 05/17/2015 through 06/15/2015 her compliance percentage improved to 53%. Nevertheless, it is still suboptimal.   I saw her on 08/25/2014, at which time she reported trouble tolerating the mask and the pressure. She was waking up with dry mouth. The strap of the mask was leaving marks on her face. She had gone to her DME representative because of her severe mouth dryness and was advised to increase the humidity, but then she had too much moisture. All in all, she was struggling. However, she did endorse sleeping better compared to before her sleep apnea diagnosis. She had had some medication changes including being on sertraline and low-dose metformin. I suggested we reduced the pressure some for better tolerance and a reduced at 13.   Of note, the patient no showed for an appointment on 11/23/2014.   I reviewed her CPAP compliance data from 11/08/2014 through 12/07/2014 which is a total of 30 days during which time  she used her machine 26 days with percent used days greater than 4 hours at 77%, indicating adequate are good compliance, average usage of 4 hours and 53 minutes for all nights, residual AHI low at 0.1 per hour, leak acceptable with occasional increase in the 95th percentile of leak at 25.4 L/m on a pressure of 13 with EPR of 3.   I first met her on 05/05/2013 at the request of her primary care physician, at which time she complained of nonrestorative sleep, sleep disruption, snoring, and daytime somnolence. I invited her back for sleep study. She had a baseline sleep study on 03/08/2014 followed by a CPAP titration study on 05/03/2014. Her baseline sleep efficiency was 84.5% with a prolonged sleep latency of 29.5 minutes and wake after sleep onset of  39.5 minutes with mild to moderate sleep fragmentation noted. She had an increased percentage of slow-wave sleep and a normal percentage of REM sleep with a mildly reduced REM latency. She had mild PLMS with minimal arousals. She had mild intermittent snoring. Total AHI was 14.8 per hour, rising to 48.4 per hour during REM sleep. Average oxygen saturation was 92% with a nadir of 76% in REM sleep. Time below 88% saturation was 20 minutes. Based on these test results I asked her to return for a full night CPAP titration study which she had on 05/03/2014. Sleep efficiency was 90.5%. Sleep latency was normal. Wake after sleep onset was 36.5 minutes with mild to moderate sleep fragmentation noted. She had moderate PLMS with mild arousals. She had slow-wave sleep at 12.7% and REM sleep at 18.7% with a slightly reduced REM latency. She had no significant EKG or EEG changes. She was titrated from 5-15 cm and also tried on BiPAP of 16/12 cm to 18/14 cm. She did reasonably well on CPAP of 14 cm. Brief supine REM sleep was achieved. AHI was 0 per hour on the pressure of 14 cm. Based on those test results I prescribed CPAP therapy.   I reviewed her compliance data from 06/11/2014 through 06/22/2014 which is a total of 12 days during which time she used her machine 7 days with percent used days greater than 4 hours of 8%, indicating poor compliance. AHI at 0.1 per hour and 95th percentile of leak at 12.2 L/m, adequate. Pressure at 14 cm with EPR of 3. Average usage of only 1 hour and 26 minutes for all days and 2 hours and 27 minutes 4 days on machine.   I reviewed her compliance data on 06/20/2014 through 07/19/2014 which is a total of 30 days during which time she used her machine only 5 days with percent used days greater than 4 hours of only 3%, indicating noncompliance, residual AHI at 0.4 per hour and leak acceptable at the same pressure.   I reviewed her compliance data from 07/22/2014 through 08/20/2014 which is a  total of 30 days during which time she used her machine 27 days with percent used days greater than 4 hours of 50%, indicating much improved compliance but still suboptimal with an average usage of 3 hours and 33 minutes and residual AHI low at 0.5 per hour and leak acceptable at 15.1 L/m at the 95th percentile. The patient called in December 2015, reporting that she was not using her CPAP machine and wanted to return it. She was advised to come in for follow-up appointment.   Her typical bedtime is reported to be around 9 PM and usual wake time is around 6 AM.  Sleep onset typically occurs within 30-60 minutes. She reports feeling marginally rested upon awakening. She wakes up on an average 1 times in the middle of the night and has to go to the bathroom rarely in a typical night. She reports no morning headaches.   She reports occasional excessive daytime somnolence (EDS) and Her Epworth Sleepiness Score (ESS) is 6/24 today. She has not fallen asleep while driving. The patient has been taking a scheduled nap, which is usually after lunch and 1 hour to 1.5 hours long. She denies dreaming in a nap and reports feeling refreshed after a nap. She has been known to snore for the past many years. Snoring is reportedly moderate, but not associated with choking sounds and witnessed apneas. The patient admits to a rare sense of choking or strangling feeling. There is no report of nighttime reflux, with no nighttime cough experienced. The patient has not noted any RLS symptoms and is not known to kick while asleep or before falling asleep. There is no family history of RLS or OSA.   She is a restless sleeper and in the morning, the bed is quite disheveled.    She denies cataplexy, sleep paralysis, hypnagogic or hypnopompic hallucinations, or sleep attacks. She does not report any vivid dreams, nightmares, dream enactments, or parasomnias, such as sleep talking or sleep walking. The patient has not had a sleep study or  a home sleep test.   She consumes 0 caffeinated beverages per day, usually in the form of decaff coffee in the morning.    Her bedroom is usually dark and cool. There is a TV in the bedroom and usually it is on at night.    Her Past Medical History Is Significant For: Past Medical History:  Diagnosis Date  . Anxiety   . Arthritis    "hands"  . 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 Diabetes mellitus without complication ([place])   . High cholesterol   . HTN (hypertension)   . Malignant neoplasm of breast (female), unspecified site    left  . Osteopenia   . Sleep apnea    uses CPAP nightly    Her Past Surgical History Is Significant For: Past Surgical History:  Procedure Laterality Date  . BREAST LUMPECTOMY Right 1990's  . BREAST LUMPECTOMY WITH RADIOACTIVE SEED LOCALIZATION Left 12/29/2014   Procedure: LEFT BREAST LUMPECTOMY WITH RADIOACTIVE SEED LOCALIZATION;  Surgeon: Fanny Skates, MD;  Location: [place];  Service: General;  Laterality: Left;  . CATARACT EXTRACTION  07/07/14   left eye  . COMBINED LAPAROSCOPY W/ HYSTEROSCOPY    . MASTECTOMY Right 02/2014  . TOTAL MASTECTOMY Right 02/09/2014   Procedure: RIGHT TOTAL MASTECTOMY;  Surgeon: Adin Hector, MD;  Location: [place];  Service: General;  Laterality: Right;  . TUBAL LIGATION      Her Family History Is Significant For: Family History  Problem Relation Age of Onset  . Hypertension Father   . CVA Father   . Diabetes Mother   . Heart disease Mother   . Diabetes Brother   . CVA Brother   . Breast cancer Paternal Aunt        dx 48s; deceased 58s  . Breast cancer Paternal Aunt        dx late 38s; deceased 55  . Leukemia Cousin        2 paternal female cousins    Her Social History Is Significant For: Social History  Socioeconomic History  . Marital status: Married    Spouse name: None  . Number of children: None  . Years of education: None  .  Highest education level: None  Social Needs  . Financial resource strain: None  . Food insecurity - worry: None  . Food insecurity - inability: None  . Transportation needs - medical: None  . Transportation needs - non-medical: None  Occupational History  . Occupation: retired    Fish farm manager: [employer]    Comment: Professor  Tobacco Use  . Smoking status: Former Smoker    Packs/day: 0.50    Years: 30.00    Pack years: 15.00    Types: Cigarettes    Last attempt to quit: 05/06/1991    Years since quitting: 26.1  . Smokeless tobacco: Never Used  Substance and Sexual Activity  . Alcohol use: No  . Drug use: No  . Sexual activity: None  Other Topics Concern  . None  Social History Narrative  . None    Her Allergies Are:  No Known Allergies:   Her Current Medications Are:  Outpatient Encounter Medications as of 06/27/2017  Medication Sig  . amLODipine-valsartan (EXFORGE) 5-160 MG per tablet Take 1 tablet by mouth daily.  . metFORMIN (GLUCOPHAGE-XR) 500 MG 24 hr tablet Take 500 mg by mouth every evening.   . metoprolol tartrate (LOPRESSOR) 25 MG tablet Take 25 mg by mouth 2 (two) times daily.   . Multiple Vitamins-Minerals (CENTRUM SILVER ADULT 50+) TABS Take 1 tablet by mouth daily.  . sertraline (ZOLOFT) 50 MG tablet Take 50 mg by mouth daily.  Marland Kitchen triamcinolone cream (KENALOG) 0.1 % Apply 1 application topically 2 (two) times daily.   No facility-administered encounter medications on file as of 06/27/2017.   :  Review of Systems:  Out of a complete 14 point review of systems, all are reviewed and negative with the exception of these symptoms as listed below:  Review of Systems  Neurological:       Pt presents today to discuss her neuro psych results. Pt forgot to bring her cpap today.    Objective:  Neurological Exam  Physical Exam Physical Examination:   Vitals:   06/27/17 1141  BP: 133/71  Pulse: 68    General Examination: The patient is a very pleasant 76 y.o.  female in no acute distress. She appears well-developed and well-nourished and well groomed.   HEENT:Normocephalic, atraumatic, pupils are equal, round and reactive to light and accommodation. She has had cataract repairs. She has b/l hearing aids. Extraocular tracking is good without limitation to gaze excursion or nystagmus noted. Normal smooth pursuit is noted. Hearing is grossly intact. Face is symmetric with normal facial animation and normal facial sensation. Speech is clear with no dysarthria noted. There is no hypophonia. There is no lip, neck/head, jaw or voice tremor. Neck is supple with full range of passive and active motion. There are no carotid bruits on auscultation. Oropharynx exam reveals: mild mouth dryness, good dental hygiene and mild airway crowding, due to narrow airway and floppy soft palate. Mallampati is class II. Tongue protrudes centrally and palate elevates symmetrically.    Chest:Clear to auscultation without wheezing, rhonchi or crackles noted.  Heart:S1+S2+0, regular and normal without murmurs, rubs or gallops noted.   Abdomen:Soft, non-tender and non-distended with normal bowel sounds appreciated on auscultation.  Extremities:There is no pitting edema in the distal lower extremities bilaterally. Pedal pulses are intact.  Skin: Warm and dry without trophic changes noted. There are  no varicose veins.  Musculoskeletal: exam reveals no obvious joint deformities, tenderness or joint swelling or erythema.   Neurologically:  Mental status: The patient is awake, alert and oriented in all 4 spheres. Her memory, attention, language and knowledge are okay, stable. There is no aphasia, agnosia, apraxia or anomia. Speech is clear with normal prosody and enunciation. Thought process is linear. Mood is congruent and affect is somewhat blunted.   On 12/09/2014: MMSE: 29/30, AFT: 9/min.  On 06/21/2015: MMSE: 29/30, CDT: 4/4, AFT: 13/min.  On 12/20/2015: MMSE: 29/30,  CDT: 4/4, AFT: 9/min.   On 06/20/2016: MMSE: 24/30, CDT: 4/4, AFT: 7/min.  Cranial nerves are as described above under HEENT exam. In addition, shoulder shrug is normal with equal shoulder height noted. Motor exam: Normal bulk, strength and tone is noted. There is no drift, tremor or rebound. Reflexes are 1-2+ throughout. Fine motor skills are grossly intact.  Cerebellar testing shows no dysmetria or intention tremor on finger to nose testing. Heel to shin is unremarkable bilaterally. There is no truncal or gait ataxia.  Sensory exam is intact to light touch in the upper and lower extremities.  Gait, station and balance are unremarkable. No veering to one side is noted. No leaning to one side is noted. Posture is age-appropriate and stance is narrow based. No problems turning are noted.   Assessment and Plan:   In summary, YASMENE SALOMONE is a very pleasant 76 year old female with an underlying medical history of breast cancer on the right, status post lumpectomy, chemotherapy and radiation therapy, hypertension, osteopenia, hypertension, and eczema, who presents for follow-up consultation of her obstructive sleep apnea and her memory loss. Recent neuropsychological testing and November 2018 supports the diagnosis of mild dementia of the Alzheimer's type. The patient's CPAP compliance has declined. It has been fluctuating in the past. I would like for patient to be consistent with her CPAP as it may help her memory as well. We talked about memory loss and the diagnosis of Alzheimer's disease and long-term implications. She is advised to talk to her family about longer-term living situation. She is advised to no longer drive. She is advised to consider moving in with one of her kids if possible. She has discussed this in the past but was reluctant to pursue this. I think she is no longer going to be safe to stay by herself. I suggested we start her on Aricept generic 5 mg strength once daily. We talked  about potential side effects. We talked about expectations and limitations of this medication. I would like for her to come back in 3 months for recheck with one of our nurse practitioners at which time we will try to increase the dose to 10 mg daily if possible. She is encouraged to be fully compliant with CPAP. She had a brain MRI with and without contrast on 07/20/2015: IMPRESSION:  Mildly abnormal MRI brain (with and without) demonstrating: 1. Few scattered periventricular and subcortical foci of non-specific gliosis, likely chronic small vessel ischemic disease. No abnormal lesions are seen on post contrast views.  2. No acute findings.  She is advised to maintain good nutrition and good hydration and maintain enough sleep time.   I answered all their questions today and the patient and her daughter-in-law were in agreement.  I spent 40 minutes in total face-to-face time with the patient, more than 50% of which was spent in counseling and coordination of care, reviewing test results, reviewing medication and discussing or reviewing the  diagnosis of OSA and dementia, its prognosis and treatment options. Pertinent laboratory and imaging test results that were available during this visit with the patient were reviewed by me and considered in my medical decision making (see chart for details).

## 2017-09-20 NOTE — Progress Notes (Addendum)
[facility]  PATIENT: Karen [name] DOB: 20-Sep-1940   REASON FOR VISIT: Follow-up for Alzheimer's dementia, obstructive sleep apnea HISTORY FROM: Patient and daughter-in-law Colletta [place]    HISTORY OF PRESENT ILLNESS: Karen [name] is a very pleasant 77 year old right-handed woman with an underlying medical history of breast cancer on the right, status post lumpectomy, chemotherapy and radiation therapy, hypertension, osteopenia, hypertension, and eczema, who presents for follow-up consultation of her memory loss and sleep apnea. The patient is accompanied by her oldest daughter-in-law again today. I last saw her 01/15/2017, at which time she was about 50% with her CPAP compliance. She had missed an appointment for cognitive testing with Dr. Bonita Quin. She was encouraged to reschedule that appointment.  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 Results of the current cognitive evaluation reveal several areas of impairment, includingbothverbal and non-verbal memory,language(i.e., confrontation naming, semantic fluency, auditory comprehension - all impaired beyond expectation even when demographic/educational background is considered), andexecutive functioning. Additionally, there is evidence that her cognitive deficits are interfering with her ability to manage complex tasks, such as managing financesand driving. As such, diagnostic criteria for a dementia syndrome are met. The patient's cognitive profile is suggestive of medial-temporal lobe involvement. Alzheimer's disease is the most likely etiology, given her cognitive profileandclinical features. She also demonstrates a high level of anxiety and milder  adjustment-related depression; however, these issues alone would not cause the level of cognitive impairment she is currently demonstrating nor would they explain her cognitive profile on testing. Her anxiety and depression are, however, possibly exacerbating underlying cognitive impairment. I would characterize her dementia as mild at this time. She is not exhibiting behavioral disturbance or psychosis, but her cognitive impairments in the areas described earlier are quite profound.  Recommendations: Based on the findings of the present evaluation, the following recommendations are offered:  1. The patientis consideredan appropriate candidate for cholinesterase inhibitor therapy, from a neuropsychological perspective. She will follow up with Dr.Atharabout this. 2.Based on these testing results and informant report of current functioning, it is my opinion that the patient should no longer independently manage instrumental ADLs including driving, medications, finances, cooking and appointments. I believe she requires assistance with all these tasks. Based on her overall performance and her performance on a specific cognitive test highly correlated with driving ability, it is my recommendation that she discontinue driving at this time. Medications should be managed for her and ideally administered to her daily. Appointments should be managed for her, and she should be accompanied by a family member to all important appointments. Finances/bills should be managed with or for her. Healthcare and financial PoA should be put in place if they are not already. Given all these recommendations for increased assistance/support, it will likely be best for the patient to move in with a family member or assisted living community. 3.The patient's family is referred to theAlzheimer's Association (www.alz.org)for additional resources and support. They may wish to seek additional resources through [employer]. 4. The patient should continue to participate in activities which provide mental stimulation,safecardiovascular exercise, and social interaction.She would likely benefit from attending programs at a senior enrichment center or through [employer]. This would also help provide structure to her day. A regular schedule is highly recommended in order to reduce confusion  and enhance day to day functioning. 5. She may benefit from an increase in dosage of Zoloft given the significant anxiety she is experiencing. However, it is also possible that once she has more assistance in daily life she will experience less stress and anxiety. 6. Neuropsychological re-assessmentcould be consideredin one year in order to monitor cognitive status, track progression of symptoms and further assist with treatment planning. >>  Today,06/27/2017(all dictated new, as well as above notes, some dictation done in note pad or Word, outside of chart, may appear as copied):  I was not able to review her most recent compliance data. It is possible that she has not been using CPAP since November. She reports that sometimes she forgets a piece from the machine and does not know where she put it. Her daughter-in-law indicates that she has a tendency to misplace parts of the CPAP machine. In the month of October through early November she had mediocre compliance. She reports that her memory is getting worse. She has not been able to decide where she wants to live long term. Her daughter-in-law reports that there have been some discussion about this in the family. Patient has 4 sons and 1 daughter. She had a change in her cholesterol medication from simvastatin to pravastatin. She's not fully sure about the dose. She is no longer on valsartan and is not sure if she is also on amlodipine or not. UPDATE 3/19/2019[name], 77 year old female returns for follow-up with her daughter-in-law.  She has a history  of obstructive sleep apnea and dementia early Alzheimer's.  Since last seen in December she has now moved in with her son and daughter-in-law.  She is no longer driving.  Meds are given to the patient.  She remains independent in dressing feeding and bathing.  She was placed on Aricept 5 mg and denies side effects to the medication.  CPAP compliance is poor.  Usage greater than 4 hours at 31%.  Average usage 4 hours 8 minutes.  Set pressure of 13 cm AHI 5.3.  Discussed compliance with daughter-in-law.  Daughter-in-law also states that since mother-in-law has moved in with them, they recognize that her memory was worse than they had expected by just seeing her in her day-to-day activities.  She returns for reevaluation REVIEW OF SYSTEMS: Full 14 system review of systems performed and notable only for those listed, all others are neg:  Constitutional: neg  Cardiovascular: neg Ear/Nose/Throat: Hearing loss Skin: neg Eyes: neg Respiratory: neg Gastroitestinal: neg  Hematology/Lymphatic: neg  Endocrine: neg Musculoskeletal:neg Allergy/Immunology: Environmental allergies Neurological: Memory loss Psychiatric: neg Sleep : Obstructive sleep apnea with CPAP   ALLERGIES: No Known Allergies  HOME MEDICATIONS: Outpatient Medications Prior to Visit  Medication Sig Dispense Refill  . amLODipine-valsartan (EXFORGE) 5-160 MG per tablet Take 1 tablet by mouth daily.    Marland Kitchen donepezil (ARICEPT) 10 MG tablet Take 10 mg by mouth at bedtime.    . metFORMIN (GLUCOPHAGE-XR) 500 MG 24 hr tablet Take 500 mg by mouth every evening.   6  . metoprolol tartrate (LOPRESSOR) 25 MG tablet Take 25 mg by mouth 2 (two) times daily.     . Multiple Vitamins-Minerals (CENTRUM SILVER ADULT 50+) TABS Take 1 tablet by mouth daily.    . pravastatin (PRAVACHOL) 80 MG tablet 80 mg daily.    . sertraline (ZOLOFT) 50 MG tablet Take 50 mg by mouth daily.  1  . triamcinolone cream (KENALOG) 0.1 % Apply 1 application topically 2 (two)  times daily.  30 g 0  . donepezil (ARICEPT) 5 MG tablet Take 1 tablet (5 mg total) by mouth at bedtime. 30 tablet 5   No facility-administered medications prior to visit.     PAST MEDICAL HISTORY: Past Medical History:  Diagnosis Date  . Anxiety   . Arthritis    "hands"  . 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 Diabetes mellitus without complication ([place])   . High cholesterol   . HTN (hypertension)   . Malignant neoplasm of breast (female), unspecified site    left  . Osteopenia   . Sleep apnea    uses CPAP nightly    PAST SURGICAL HISTORY: Past Surgical History:  Procedure Laterality Date  . BREAST LUMPECTOMY Right 1990's  . BREAST LUMPECTOMY WITH RADIOACTIVE SEED LOCALIZATION Left 12/29/2014   Procedure: LEFT BREAST LUMPECTOMY WITH RADIOACTIVE SEED LOCALIZATION;  Surgeon: Fanny Skates, MD;  Location: [facility];  Service: General;  Laterality: Left;  . CATARACT EXTRACTION  07/07/14   left eye  . COMBINED LAPAROSCOPY W/ HYSTEROSCOPY    . MASTECTOMY Right 02/2014  . TOTAL MASTECTOMY Right 02/09/2014   Procedure: RIGHT TOTAL MASTECTOMY;  Surgeon: Adin Hector, MD;  Location: [facility];  Service: General;  Laterality: Right;  . TUBAL LIGATION      FAMILY HISTORY: Family History  Problem Relation Age of Onset  . Hypertension Father   . CVA Father   . Diabetes Mother   . Heart disease Mother   . Diabetes Brother   . CVA Brother   . Breast cancer Paternal Aunt        dx 47s; deceased 71s  . Breast cancer Paternal Aunt        dx late 30s; deceased 66  . Leukemia Cousin        2 paternal female cousins    SOCIAL HISTORY: Social History   Socioeconomic History  . Marital status: Married    Spouse name: Not on file  . Number of children: Not on file  . Years of education: Not on file  . Highest education level: Not on file  Social Needs  . Financial resource strain: Not on file  . Food insecurity -  worry: Not on file  . Food insecurity - inability: Not on file  . Transportation needs - medical: Not on file  . Transportation needs - non-medical: Not on file  Occupational History  . Occupation: retired    Fish farm manager: [employer]    Comment: Professor  Tobacco Use  . Smoking status: Former Smoker    Packs/day: 0.50    Years: 30.00    Pack years: 15.00    Types: Cigarettes    Last attempt to quit: 05/06/1991    Years since quitting: 26.4  . Smokeless tobacco: Never Used  Substance and Sexual Activity  . Alcohol use: No  . Drug use: No  . Sexual activity: Not on file  Other Topics Concern  . Not on file  Social History Narrative  . Not on file     PHYSICAL EXAM  Vitals:   09/25/17 1256  BP: 128/61  Pulse: 62  Weight: 114 lb (51.7 kg)   Body mass index is 24.67 kg/m.  Generalized: Well developed, in no acute distress , well-groomed Head: normocephalic and atraumatic,. Oropharynx benign Mallampatti 2 Neck: Supple, no carotid bruits  Cardiac: Regular rate rhythm, no murmur  Musculoskeletal: No deformity   Neurological examination   Mentation: Alert  oriented to time, place, history taking. Attention span and concentration appropriate. Recent and remote memory intact.  Follows all commands speech and language fluent.  On 12/09/2014: MMSE: 29/30, AFT: 9/min.  On 06/21/2015: MMSE: 29/30, CDT: 4/4, AFT: 13/min.  On 12/20/2015: MMSE: 29/30, CDT: 4/4, AFT: 9/min.   On 06/20/2016: MMSE: 24/30, CDT: 4/4, AFT: 7/min.  On 09/25/17/ MMSE 21/30  CDT 4/4         AFT 6/min Cranial nerve II-XII: Fundoscopic exam reveals sharp disc margins.Pupils were equal round reactive to light extraocular movements were full, visual field were full on confrontational test. Facial sensation and strength were normal. hearing was intact to finger rubbing bilaterally. Uvula tongue midline. head turning and shoulder shrug were normal and symmetric.Tongue protrusion into cheek strength was normal. Motor:  normal bulk and tone, full strength in the BUE, BLE,  Sensory: normal and symmetric to light touch,  Coordination: finger-nose-finger, heel-to-shin bilaterally, no dysmetria Reflexes: 1-2+ upper lower and symmetric , plantar responses were flexor bilaterally. Gait and Station: Rising up from seated position without assistance, normal stance,  moderate stride, good arm swing, smooth turning,  DIAGNOSTIC DATA (LABS, IMAGING, TESTING) - I reviewed patient records, labs, notes, testing and imaging myself where available.  Lab Results  Component Value Date   WBC 6.0 12/29/2014   HGB 14.6 12/29/2014   HCT 43.0 12/29/2014   MCV 86.6 12/29/2014   PLT 179 12/29/2014      Component Value Date/Time   NA 141 12/29/2014 0832   NA 140 12/05/2013 0911   K 4.2 12/29/2014 0832   K 4.4 12/05/2013 0911   CL 101 12/29/2014 0832   CO2 31 01/29/2014 0919   CO2 24 12/05/2013 0911   GLUCOSE 102 (H) 12/29/2014 0832   GLUCOSE 197 (H) 12/05/2013 0911   BUN 22 (H) 12/29/2014 0832   BUN 12.3 12/05/2013 0911   CREATININE 0.90 12/29/2014 0832   CREATININE 0.9 12/05/2013 0911   CALCIUM 10.0 01/29/2014 0919   CALCIUM 9.2 12/05/2013 0911   PROT 8.1 01/29/2014 0919   PROT 7.4 12/05/2013 0911   ALBUMIN 4.2 01/29/2014 0919   ALBUMIN 3.8 12/05/2013 0911   AST 31 01/29/2014 0919   AST 33 12/05/2013 0911   ALT 37 (H) 01/29/2014 0919   ALT 34 12/05/2013 0911   ALKPHOS 50 01/29/2014 0919   ALKPHOS 45 12/05/2013 0911   BILITOT 0.6 01/29/2014 0919   BILITOT 0.45 12/05/2013 0911   GFRNONAA 89 (L) 02/09/2014 1150   GFRAA >90 02/09/2014 1150    ASSESSMENT AND PLAN Karen [name] is a very pleasant 77 year old female with an underlying medical history of breast cancer on the right, status post lumpectomy, chemotherapy and radiation therapy, hypertension, osteopenia, hypertension, and eczema, who presents for follow-up consultation of her obstructive sleep apnea and her memory loss. Recent neuropsychological  testing  November 2018 supports the diagnosis of mild dementia of the Alzheimer's type. The patient's CPAP compliance has declined. It has been fluctuating in the past. I would like for patient to be consistent with her CPAP as it may help her memory as well. We talked about memory loss and the diagnosis of Alzheimer's disease and long-term implications. MRI with and without contrast on 07/20/2015: IMPRESSION:  Mildly abnormal MRI brain (with and without) demonstrating: 1. Few scattered periventricular and subcortical foci of non-specific gliosis, likely chronic small vessel ischemic disease. No abnormal lesions are seen on post contrast views.  2. No acute findings.  PLAN: Increase Aricept to 10  mg daily will refill CPAP compliance 31%, please try to use it more consistently this will also help your memory No safety issues identified now that she is living with family  Follow-up in 6 months I spent 25 minutes in total face to face time with the patient/DILmore than 50% of which was spent counseling and coordination of care, reviewing test results reviewing medications and discussing and reviewing the diagnosis of obstructive sleep apnea with CPAP compliance and dementia and further treatment options. , Dennie Bible, [facility], [facility], APRN  [facility] [address], [place], [place] [address] [phone]  I reviewed the above note and documentation by the Nurse Practitioner and agree with the history, physical exam, assessment and plan as outlined above. I was immediately available for face-to-face consultation. Star Age, MD, PhD [facility])

## 2017-09-22 ENCOUNTER — Encounter: Payer: Self-pay | Admitting: Nurse Practitioner

## 2017-09-25 ENCOUNTER — Encounter: Payer: Self-pay | Admitting: Nurse Practitioner

## 2017-09-25 ENCOUNTER — Ambulatory Visit: Payer: Medicare Other | Admitting: Nurse Practitioner

## 2017-09-25 DIAGNOSIS — Z9989 Dependence on other enabling machines and devices: Secondary | ICD-10-CM | POA: Diagnosis not present

## 2017-09-25 DIAGNOSIS — G4733 Obstructive sleep apnea (adult) (pediatric): Secondary | ICD-10-CM | POA: Insufficient documentation

## 2017-09-25 DIAGNOSIS — F0390 Unspecified dementia without behavioral disturbance: Secondary | ICD-10-CM | POA: Insufficient documentation

## 2017-09-25 DIAGNOSIS — F0280 Dementia in other diseases classified elsewhere without behavioral disturbance: Secondary | ICD-10-CM | POA: Diagnosis not present

## 2017-09-25 DIAGNOSIS — G309 Alzheimer's disease, unspecified: Secondary | ICD-10-CM | POA: Diagnosis not present

## 2017-09-25 MED ORDER — DONEPEZIL HCL 10 MG PO TABS: 10.0000 mg | ORAL_TABLET | Freq: Every day | ORAL | 6 refills | Status: DC

## 2017-09-25 NOTE — Patient Instructions (Signed)
Increase Aricept to 10 mg daily CPAP compliance 31%, please try to use it more consistently this will also help your memory No safety issues identified Follow-up in 6 months

## 2018-01-16 ENCOUNTER — Ambulatory Visit: Payer: Medicare Other | Admitting: Neurology

## 2018-01-16 ENCOUNTER — Encounter: Payer: Self-pay | Admitting: Neurology

## 2018-01-16 VITALS — BP 116/64 | HR 57 | Ht <= 58 in | Wt 115.0 lb

## 2018-01-16 DIAGNOSIS — Z9989 Dependence on other enabling machines and devices: Secondary | ICD-10-CM

## 2018-01-16 DIAGNOSIS — G4733 Obstructive sleep apnea (adult) (pediatric): Secondary | ICD-10-CM

## 2018-01-16 DIAGNOSIS — G301 Alzheimer's disease with late onset: Secondary | ICD-10-CM | POA: Diagnosis not present

## 2018-01-16 DIAGNOSIS — F0280 Dementia in other diseases classified elsewhere without behavioral disturbance: Secondary | ICD-10-CM

## 2018-01-16 MED ORDER — DONEPEZIL HCL 10 MG PO TABS: 10.0000 mg | ORAL_TABLET | Freq: Every day | ORAL | 3 refills | Status: DC

## 2018-01-16 NOTE — Progress Notes (Signed)
Subjective:    Patient ID: Karen [name] is a 77 y.o. female.  HPI     Interim history:   Karen [name] is a very pleasant 77 year old right-handed woman with an underlying medical history of breast cancer on the right, status post lumpectomy, chemotherapy and radiation therapy, hypertension, osteopenia, hypertension, and eczema, who presents for follow-up consultation of her memory loss and sleep apnea. The patient is accompanied by her oldest daughter-in-law again today. I last saw her on 06/27/2017, at which time I started her on Aricept 5 mg strength. She was noncompliant with her CPAP and was encouraged to be fully compliant with it.   She had an interim appointment with [name], nurse practitioner on 09/25/2017, at which time her donepezil was increased to 10 mg strength once daily.  Today, 01/16/2018 (all dictated new, as well as above notes, some dictation done in note pad or Word, outside of chart, may appear as copied):    I reviewed her CPAP compliance data from 12/15/2017 through 01/13/2018 which is a total of 30 days, during which time she used her CPAP 27 days with percent used days greater than 4 hours at 63%, indicating suboptimal compliance but better than from before, average usage of 5 hours and 19 minutes for days on treatment, residual AHI borderline at 6.5 per hour, leak quite high with the 95th percentile at 56.7 L/m on a pressure of 13 cm with EPR of 3. She reports doing better with the CPAP but has a tendency to pull off the mask in the middle of the night, had recently noticed some bad dreams but incidentally they also were traveling quite a bit recently passed week or 2 and just recently got back this week. She has noticed no other side effect with Aricept. Her daughter-in-law has taken over her medication to make sure she takes it regularly and not accidentally twice which has happened once before. Mood and memory-wise she is stable but admits to having some frustration  over losing some of her independence and not being able to drive any longer and having to move in with her son and his family, having to leave her house of so many years which is currently empty.   The patient's allergies, current medications, family history, past medical history, past social history, past surgical history and problem list were reviewed and updated as appropriate.    Previously (copied from previous notes for reference):    I saw her on 01/15/2017, at which time she was about 50% with her CPAP compliance. She had missed an appointment for cognitive testing with Dr. Bonita Quin. She was encouraged to reschedule that appointment.   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 Results of the current cognitive evaluation reveal several areas of impairment, including both verbal and non-verbal memory, language (i.e., confrontation naming, semantic fluency, auditory comprehension - all impaired beyond expectation even when demographic/educational background is considered), and executive functioning. Additionally, there is evidence that her cognitive deficits are interfering with her ability to manage complex tasks, such as managing finances and driving. As such, diagnostic criteria for a dementia syndrome are met. The patient's cognitive profile is suggestive of medial-temporal lobe involvement. Alzheimer's disease is the most likely etiology, given her cognitive profile and clinical features. She also demonstrates  a high level of anxiety and milder adjustment-related depression; however, these issues alone would not cause the level of cognitive impairment she is currently demonstrating nor would they explain her cognitive profile on  testing. Her anxiety and depression are, however, possibly exacerbating underlying cognitive impairment. I would characterize her dementia as mild at this time. She is not exhibiting behavioral disturbance or psychosis, but her cognitive impairments in the areas described earlier are quite profound.   Recommendations: Based on the findings of the present evaluation, the following recommendations are offered:   1. The patient is considered an appropriate candidate for cholinesterase inhibitor therapy, from a neuropsychological perspective. She will follow up with Dr. Rexene Alberts about this. 2. Based on these testing results and informant report of current functioning, it is my opinion that the patient should no longer independently manage instrumental ADLs including driving, medications, finances, cooking and appointments. I believe she requires assistance with all these tasks. Based on her overall performance and her performance on a specific cognitive test highly correlated with driving ability, it is my recommendation that she discontinue driving at this time. Medications should be managed for her and ideally administered to her daily. Appointments should be managed for her, and she should be accompanied by a family member to all important appointments. Finances/bills should be managed with or for her. Healthcare and financial PoA should be put in place if they are not already. Given all these recommendations for increased assistance/support, it will likely be best for the patient to move in with a family member or assisted living community. 3. The patient's family is referred to the Alzheimer's [employer] ([employer]) for additional resources and support. They may wish to seek additional resources through [employer]. 4. The patient should continue to participate in activities which provide mental stimulation, safe cardiovascular exercise, and social interaction. She would likely benefit from  attending programs at a senior enrichment center or through [employer]. This would also help provide structure to her day. A regular schedule is highly recommended in order to reduce confusion and enhance day to day functioning. 5. She may benefit from an increase in dosage of Zoloft given the significant anxiety she is experiencing. However, it is also possible that once she has more assistance in daily life she will experience less stress and anxiety. 6. Neuropsychological re-assessment could be considered in one year in order to monitor cognitive status, track progression of symptoms and further assist with treatment planning. >>    I reviewed her CPAP compliance data from 12/11/2016 through 01/09/2017 which is a total of 30 days, during which time she used her machine only 21 days with percent used days greater than 4 hours at 50%, indicating suboptimal compliance with an average usage for all days on treatment of 4 hours and 43 minutes, residual AHI 1.9 per hour, leak high with the 95th percentile of 38.1 L/m on a pressure of 13 cm with EPR of 3.    I saw her on 06/20/2016, at which time she reported doing about the same. She felt that her memory was stable. She had no actual issues with CPAP but was not fully compliant with it either, tendency to pull off the mask in the middle of the night. She did not have neuropsychological testing done. No sustained symptoms of depression were reported. She was agreeable to pursuing neuropsychological evaluation, I made a referral in that regard, she no showed for an appointment with Dr. Bonita Quin on 08/03/2016.  I saw her on 12/20/2015, at which time she reported doing overall fairly well. She had recently lost her husband in January 2017 after a prolonged illness and progressive decline. She was living alone. She had good support from her 3 sons and her daughter. She was driving but did report occasional issues with getting easily confused  while driving. She had an MRI brain with and without contrast in January 2017 and we reviewed the results: IMPRESSION:   Mildly abnormal MRI brain (with and without) demonstrating: 1. Few scattered periventricular and subcortical foci of non-specific gliosis, likely chronic small vessel ischemic disease. No abnormal lesions are seen on post contrast views.    2. No acute findings. She had new hearing aids.  I suggested we proceed with formal neuropsychological evaluation. She did not have the test or consultation done and it appears she canceled an appointment with Dr. Valentina Shaggy on 03/08/2016.   I reviewed her CPAP compliance data from 05/18/2016 through 06/16/2016 which is a total of 30 days, during which time she used her CPAP 24 days with percent used days greater than 4 hours at 53%, indicating suboptimal compliance with an average usage of 4 hours and 46 minutes for days on treatment, residual AHI 2.3 per hour, leak on the high side with the 95th percentile at 28.7 L/m on a pressure of 13 cm with EPR of 3.   I saw her on 06/21/2015, at which time she reported feeling fairly stable, her husband was at [facility] since October 2016, and she had sitters and their four children were taking turns in staying overnight. She was still worried about her memory issues, including forgetfulness, misplacing things, and trouble focusing and paying attention. Her scores were stable. Her CPAP compliance was suboptimal.   12/20/2015: I reviewed her CPAP compliance data from 11/19/15 to 12/18/2015, which is a total of 30 days, during which time she used her machine 27 days with percent used days greater than 4 hours at 63%, indicating suboptimal compliance but improved from 6 months ago. Average usage of 4 hours and 22 minutes, residual AHI 0.8 per hour, leak acceptable for the most part but 95th percentile of 24.3 L/m on a pressure of 13 cm with EPR of 3.   Of note, the patient no showed for an appointment on  06/10/2015. I last saw her on 12/09/2014, at which time she reported, that she had to skip nights with CPAP d/t her husband's illness and being in the hospital with him. Overall she had done much better than in the past with her CPAP and actually felt better. She had needle Bx on the L breast. She has support from her 4 kids. She c/o memory loss, mostly forgetfulness and her MMSE was 29/30, AFT was 9/min at the time. I suggested we monitor her memory. I felt that stress was contributing to her memory issues.   I reviewed her CPAP compliance data from 05/10/2015 through 06/08/2015 which is a total of 30 days during which time she used her machine 27 days with percent used days greater than 4 hours of only 47%, indicating suboptimal compliance with an average usage of 4 hours only, residual AHI low at 0.7 per hour, leak at times high with the 95th percentile at 25.3 L/m on a pressure of 13 cm with EPR of 3. More recently, 05/17/2015 through 06/15/2015 her compliance percentage improved to 53%. Nevertheless, it is still suboptimal.   I saw her on 08/25/2014, at which time she reported trouble  tolerating the mask and the pressure. She was waking up with dry mouth. The strap of the mask was leaving marks on her face. She had gone to her DME representative because of her severe mouth dryness and was advised to increase the humidity, but then she had too much moisture. All in all, she was struggling. However, she did endorse sleeping better compared to before her sleep apnea diagnosis. She had had some medication changes including being on sertraline and low-dose metformin. I suggested we reduced the pressure some for better tolerance and a reduced at 13.   Of note, the patient no showed for an appointment on 11/23/2014.   I reviewed her CPAP compliance data from 11/08/2014 through 12/07/2014 which is a total of 30 days during which time she used her machine 26 days with percent used days greater than 4 hours at  77%, indicating adequate are good compliance, average usage of 4 hours and 53 minutes for all nights, residual AHI low at 0.1 per hour, leak acceptable with occasional increase in the 95th percentile of leak at 25.4 L/m on a pressure of 13 with EPR of 3.   I first met her on 05/05/2013 at the request of her primary care physician, at which time she complained of nonrestorative sleep, sleep disruption, snoring, and daytime somnolence. I invited her back for sleep study. She had a baseline sleep study on 03/08/2014 followed by a CPAP titration study on 05/03/2014. Her baseline sleep efficiency was 84.5% with a prolonged sleep latency of 29.5 minutes and wake after sleep onset of 39.5 minutes with mild to moderate sleep fragmentation noted. She had an increased percentage of slow-wave sleep and a normal percentage of REM sleep with a mildly reduced REM latency. She had mild PLMS with minimal arousals. She had mild intermittent snoring. Total AHI was 14.8 per hour, rising to 48.4 per hour during REM sleep. Average oxygen saturation was 92% with a nadir of 76% in REM sleep. Time below 88% saturation was 20 minutes. Based on these test results I asked her to return for a full night CPAP titration study which she had on 05/03/2014. Sleep efficiency was 90.5%. Sleep latency was normal. Wake after sleep onset was 36.5 minutes with mild to moderate sleep fragmentation noted. She had moderate PLMS with mild arousals. She had slow-wave sleep at 12.7% and REM sleep at 18.7% with a slightly reduced REM latency. She had no significant EKG or EEG changes. She was titrated from 5-15 cm and also tried on BiPAP of 16/12 cm to 18/14 cm. She did reasonably well on CPAP of 14 cm. Brief supine REM sleep was achieved. AHI was 0 per hour on the pressure of 14 cm. Based on those test results I prescribed CPAP therapy.   I reviewed her compliance data from 06/11/2014 through 06/22/2014 which is a total of 12 days during which time she  used her machine 7 days with percent used days greater than 4 hours of 8%, indicating poor compliance. AHI at 0.1 per hour and 95th percentile of leak at 12.2 L/m, adequate. Pressure at 14 cm with EPR of 3. Average usage of only 1 hour and 26 minutes for all days and 2 hours and 27 minutes 4 days on machine.   I reviewed her compliance data on 06/20/2014 through 07/19/2014 which is a total of 30 days during which time she used her machine only 5 days with percent used days greater than 4 hours of only 3%, indicating noncompliance, residual  AHI at 0.4 per hour and leak acceptable at the same pressure.   I reviewed her compliance data from 07/22/2014 through 08/20/2014 which is a total of 30 days during which time she used her machine 27 days with percent used days greater than 4 hours of 50%, indicating much improved compliance but still suboptimal with an average usage of 3 hours and 33 minutes and residual AHI low at 0.5 per hour and leak acceptable at 15.1 L/m at the 95th percentile. The patient called in December 2015, reporting that she was not using her CPAP machine and wanted to return it. She was advised to come in for follow-up appointment.   Her typical bedtime is reported to be around 9 PM and usual wake time is around 6 AM. Sleep onset typically occurs within 30-60 minutes. She reports feeling marginally rested upon awakening. She wakes up on an average 1 times in the middle of the night and has to go to the bathroom rarely in a typical night. She reports no morning headaches.   She reports occasional excessive daytime somnolence (EDS) and Her Epworth Sleepiness Score (ESS) is 6/24 today. She has not fallen asleep while driving. The patient has been taking a scheduled nap, which is usually after lunch and 1 hour to 1.5 hours long. She denies dreaming in a nap and reports feeling refreshed after a nap. She has been known to snore for the past many years. Snoring is reportedly moderate, but not  associated with choking sounds and witnessed apneas. The patient admits to a rare sense of choking or strangling feeling. There is no report of nighttime reflux, with no nighttime cough experienced. The patient has not noted any RLS symptoms and is not known to kick while asleep or before falling asleep. There is no family history of RLS or OSA.   She is a restless sleeper and in the morning, the bed is quite disheveled.    She denies cataplexy, sleep paralysis, hypnagogic or hypnopompic hallucinations, or sleep attacks. She does not report any vivid dreams, nightmares, dream enactments, or parasomnias, such as sleep talking or sleep walking. The patient has not had a sleep study or a home sleep test.   She consumes 0 caffeinated beverages per day, usually in the form of decaff coffee in the morning.    Her bedroom is usually dark and cool. There is a TV in the bedroom and usually it is on at night.     Her Past Medical History Is Significant For: Past Medical History:  Diagnosis Date  . Anxiety   . Arthritis    "hands"  . 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 Diabetes mellitus without complication ([place])   . High cholesterol   . HTN (hypertension)   . Malignant neoplasm of breast (female), unspecified site    left  . Osteopenia   . Sleep apnea    uses CPAP nightly    Her Past Surgical History Is Significant For: Past Surgical History:  Procedure Laterality Date  . BREAST LUMPECTOMY Right 1990's  . BREAST LUMPECTOMY WITH RADIOACTIVE SEED LOCALIZATION Left 12/29/2014   Procedure: LEFT BREAST LUMPECTOMY WITH RADIOACTIVE SEED LOCALIZATION;  Surgeon: Fanny Skates, MD;  Location: [facility];  Service: General;  Laterality: Left;  . CATARACT EXTRACTION  07/07/14   left eye  . COMBINED LAPAROSCOPY W/ HYSTEROSCOPY    . MASTECTOMY Right 02/2014  . TOTAL MASTECTOMY Right 02/09/2014   Procedure:  RIGHT TOTAL MASTECTOMY;  Surgeon:  Adin Hector, MD;  Location: [facility];  Service: General;  Laterality: Right;  . TUBAL LIGATION      Her Family History Is Significant For: Family History  Problem Relation Age of Onset  . Hypertension Father   . CVA Father   . Diabetes Mother   . Heart disease Mother   . Diabetes Brother   . CVA Brother   . Breast cancer Paternal Aunt        dx 31s; deceased 74s  . Breast cancer Paternal Aunt        dx late 64s; deceased 42  . Leukemia Cousin        2 paternal female cousins    Her Social History Is Significant For: Social History   Socioeconomic History  . Marital status: Married    Spouse name: Not on file  . Number of children: Not on file  . Years of education: Not on file  . Highest education level: Not on file  Occupational History  . Occupation: retired    Fish farm manager: [employer]    Comment: Professor  Social Needs  . Financial resource strain: Not on file  . Food insecurity:    Worry: Not on file    Inability: Not on file  . Transportation needs:    Medical: Not on file    Non-medical: Not on file  Tobacco Use  . Smoking status: Former Smoker    Packs/day: 0.50    Years: 30.00    Pack years: 15.00    Types: Cigarettes    Last attempt to quit: 05/06/1991    Years since quitting: 26.7  . Smokeless tobacco: Never Used  Substance and Sexual Activity  . Alcohol use: No  . Drug use: No  . Sexual activity: Not on file  Lifestyle  . Physical activity:    Days per week: Not on file    Minutes per session: Not on file  . Stress: Not on file  Relationships  . Social connections:    Talks on phone: Not on file    Gets together: Not on file    Attends religious service: Not on file    Active member of club or organization: Not on file    Attends meetings of clubs or organizations: Not on file    Relationship status: Not on file  Other Topics Concern  . Not on file  Social History Narrative  . Not on file    Her Allergies Are:  No Known Allergies:    Her Current Medications Are:  Outpatient Encounter Medications as of 01/16/2018  Medication Sig  . amLODipine-valsartan (EXFORGE) 5-160 MG per tablet Take 1 tablet by mouth daily.  Marland Kitchen donepezil (ARICEPT) 10 MG tablet Take 1 tablet (10 mg total) by mouth at bedtime.  . metFORMIN (GLUCOPHAGE-XR) 500 MG 24 hr tablet Take 500 mg by mouth every evening.   . metoprolol tartrate (LOPRESSOR) 25 MG tablet Take 25 mg by mouth 2 (two) times daily.   . Multiple Vitamins-Minerals (CENTRUM SILVER ADULT 50+) TABS Take 1 tablet by mouth daily.  . pravastatin (PRAVACHOL) 80 MG tablet 80 mg daily.  . sertraline (ZOLOFT) 50 MG tablet Take 50 mg by mouth daily.  Marland Kitchen triamcinolone cream (KENALOG) 0.1 % Apply 1 application topically 2 (two) times daily.   No facility-administered encounter medications on file as of 01/16/2018.   :  Review of Systems:  Out of a complete 14 point review of systems,  all are reviewed and negative with the exception of these symptoms as listed below: Review of Systems  Neurological:       Pt presents today to discuss her cpap. Pt reports that she takes her cpap off during the night without knowing it.    Objective:  Neurological Exam  Physical Exam Physical Examination:   Vitals:   01/16/18 1301  BP: 116/64  Pulse: (!) 57    General Examination: The patient is a very pleasant 77 y.o. female in no acute distress. She appears well-developed and well-nourished and well groomed.   HEENT:Normocephalic, atraumatic, pupils are equal, round and reactive to light and accommodation. She has had cataract repairs. She has b/l hearing aids. Extraocular tracking is good without limitation to gaze excursion or nystagmus noted. Normal smooth pursuit is noted. Hearing is grossly intact. Face is symmetric with normal facial animation and normal facial sensation. Speech is clear with no dysarthria noted. There is no hypophonia. There is no lip, neck/head, jaw or voice tremor. Neck is supple  with full range of passive and active motion. There are no carotid bruits on auscultation. Oropharynx exam reveals: mild mouth dryness, good dental hygiene and mild airway crowding, due to narrow airway and floppy soft palate. Mallampati is class II. Tongue protrudes centrally and palate elevates symmetrically.    Chest:Clear to auscultation without wheezing, rhonchi or crackles noted.  Heart:S1+S2+0, regular and normal without murmurs, rubs or gallops noted.   Abdomen:Soft, non-tender and non-distended with normal bowel sounds appreciated on auscultation.  Extremities:There is no pitting edema in the distal lower extremities bilaterally. Pedal pulses are intact.  Skin: Warm and dry without trophic changes noted. There are no varicose veins.  Musculoskeletal: exam reveals no obvious joint deformities, tenderness or joint swelling or erythema.   Neurologically:  Mental status: The patient is awake, alert and oriented in all 4 spheres. Her memory, attention, language and knowledge areokay, stable.There is no aphasia, agnosia, apraxia or anomia. Speech is clear with normal prosody and enunciation. Thought process is linear. Mood is congruent and affect issomewhat blunted.  On 12/09/2014: MMSE: 29/30, AFT: 9/min.  On 06/21/2015: MMSE: 29/30, CDT: 4/4, AFT: 13/min.  On 12/20/2015: MMSE: 29/30, CDT: 4/4, AFT: 9/min.   On 06/20/2016: MMSE: 24/30, CDT: 4/4, AFT: 7/min.  On 09/25/17: MMSE: 21/30, CDT: 4/4, AFT: 6/min.  Cranial nerves are as described above under HEENT exam. In addition, shoulder shrug is normal with equal shoulder height noted. Motor exam: Normal bulk, strength and tone is noted. There is no drift, tremor or rebound. Reflexes are 1-2+ throughout. Fine motor skills aregrossly intact.  Cerebellar testing shows no dysmetria or intention tremor on finger to nose testing. There is no truncal or gait ataxia.  Sensory exam is intact to light touch in the upper and  lower extremities.  Gait, station and balance are unremarkable. No veering to one side is noted. No leaning to one side is noted. Posture is age-appropriate and stance is narrow based. No problems turning are noted.   Assessment and Plan:   In summary, Karen [name] is a very pleasant 77 year old female with an underlying medical history of breast cancer on the right, status post lumpectomy, chemotherapy and radiation therapy, hypertension, osteopenia, hypertension, and eczema, who presents for follow-up consultation of her obstructive sleep apnea and her memory loss. Recent neuropsychological testing in November 2018 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 The patient's CPAP compliance has improved since December 2018. She would like  to try a full facemask again which I prescribed. She's tolerating the donepezil at 10 mg strength but may have had some recent bad dreams. She was noted to have some sleep talking and one time she was noted to yell out, this was recently during a family vacation. She is advised to continue to try to take the medication on time and be very compliant with CPAP. Furthermore, it would help to keep a set regimen for her bedtime and rise time routine. She can also try a little bit of melatonin at night.  She has moved in with her son's family. She does miss having her own house and being able to drive but is adjusting to her new long-term living situation. Her brain MRI with and without contrast on 07/20/2015 showed: IMPRESSION:  Mildly abnormal MRI brain (with and without) demonstrating: 1. Few scattered periventricular and subcortical foci of non-specific gliosis, likely chronic small vessel ischemic disease. No abnormal lesions are seen on post contrast views.  2. No acute findings.  She is advised to maintain good nutrition and good hydration and maintain enough sleep time.  I suggested a 6 month follow-up, she can see [name], nurse  practitioner. I answered all their questions today and the patient and her daughter-in-law were in agreement.  I spent 30 minutes in total face-to-face time with the patient, more than 50% of which was spent in counseling and coordination of care, reviewing test results, reviewing medication and discussing or reviewing the diagnosis of dementia, OSA, its prognosis and treatment options. Pertinent laboratory and imaging test results that were available during this visit with the patient were reviewed by me and considered in my medical decision making (see chart for details).

## 2018-01-16 NOTE — Patient Instructions (Addendum)
Your bad dreams may be due to the Aricept for your memory.   You can try Melatonin at night for sleep: take 1 mg to 3 mg, one to 2 hours before your bedtime. You can go up to 5 mg if needed. It is over the counter and comes in pill form, chewable form and spray, if you prefer.    Keep a schedule for your bedtime and rise time.   We will continue your medication and follow up.   You have done a much better job with the CPAP!  We will need to replace all your CPAP supplies on a regular basis, including, mask, hose, filter and headgear and humidifier unit.

## 2018-03-28 ENCOUNTER — Ambulatory Visit: Payer: Medicare Other | Admitting: Nurse Practitioner

## 2018-07-30 NOTE — Progress Notes (Deleted)
[facility]  PATIENT: Karen [name] DOB: 20-Sep-1940   REASON FOR VISIT: Follow-up for Alzheimer's dementia, obstructive sleep apnea HISTORY FROM: Patient and daughter-in-law Karen [name]    HISTORY OF PRESENT ILLNESS: Karen [name] is a very pleasant 78 year old right-handed woman with an underlying medical history of breast cancer on the right, status post lumpectomy, chemotherapy and radiation therapy, hypertension, osteopenia, hypertension, and eczema, who presents for follow-up consultation of her memory loss and sleep apnea. The patient is accompanied by her oldest daughter-in-law again today. I last saw her 01/15/2017, at which time she was about 50% with her CPAP compliance. She had missed an appointment for cognitive testing with Dr. Bonita Quin. She was encouraged to reschedule that appointment.  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 Results of the current cognitive evaluation reveal several areas of impairment, includingbothverbal and non-verbal memory,language(i.e., confrontation naming, semantic fluency, auditory comprehension - all impaired beyond expectation even when demographic/educational background is considered), andexecutive functioning. Additionally, there is evidence that her cognitive deficits are interfering with her ability to manage complex tasks, such as managing financesand driving. As such, diagnostic criteria for a dementia syndrome are met. The patient's cognitive profile is suggestive of medial-temporal lobe involvement. Alzheimer's disease is the most likely etiology, given her cognitive profileandclinical features. She also demonstrates a high level of anxiety and milder  adjustment-related depression; however, these issues alone would not cause the level of cognitive impairment she is currently demonstrating nor would they explain her cognitive profile on testing. Her anxiety and depression are, however, possibly exacerbating underlying cognitive impairment. I would characterize her dementia as mild at this time. She is not exhibiting behavioral disturbance or psychosis, but her cognitive impairments in the areas described earlier are quite profound.  Recommendations: Based on the findings of the present evaluation, the following recommendations are offered:  1. The patientis consideredan appropriate candidate for cholinesterase inhibitor therapy, from a neuropsychological perspective. She will follow up with Dr.Atharabout this. 2.Based on these testing results and informant report of current functioning, it is my opinion that the patient should no longer independently manage instrumental ADLs including driving, medications, finances, cooking and appointments. I believe she requires assistance with all these tasks. Based on her overall performance and her performance on a specific cognitive test highly correlated with driving ability, it is my recommendation that she discontinue driving at this time. Medications should be managed for her and ideally administered to her daily. Appointments should be managed for her, and she should be accompanied by a family member to all important appointments. Finances/bills should be managed with or for her. Healthcare and financial PoA should be put in place if they are not already. Given all these recommendations for increased assistance/support, it will likely be best for the patient to move in with a family member or assisted living community. 3.The patient's family is referred to theAlzheimer's Association (www.alz.org)for additional resources and support. They may wish to seek additional resources through [employer]. 4. The patient should continue to participate in activities which provide mental stimulation,safecardiovascular exercise, and social interaction.She would likely benefit from attending programs at a senior enrichment center or through [employer]. This would also help provide structure to her day. A regular schedule is highly recommended in order to reduce confusion  and enhance day to day functioning. 5. She may benefit from an increase in dosage of Zoloft given the significant anxiety she is experiencing. However, it is also possible that once she has more assistance in daily life she will experience less stress and anxiety. 6. Neuropsychological re-assessmentcould be consideredin one year in order to monitor cognitive status, track progression of symptoms and further assist with treatment planning. >>  Today,06/27/2017(all dictated new, as well as above notes, some dictation done in note pad or Word, outside of chart, may appear as copied):  I was not able to review her most recent compliance data. It is possible that she has not been using CPAP since November. She reports that sometimes she forgets a piece from the machine and does not know where she put it. Her daughter-in-law indicates that she has a tendency to misplace parts of the CPAP machine. In the month of October through early November she had mediocre compliance. She reports that her memory is getting worse. She has not been able to decide where she wants to live long term. Her daughter-in-law reports that there have been some discussion about this in the family. Patient has 4 sons and 1 daughter. She had a change in her cholesterol medication from simvastatin to pravastatin. She's not fully sure about the dose. She is no longer on valsartan and is not sure if she is also on amlodipine or not. UPDATE 3/19/2019[name], 78 year old female returns for follow-up with her daughter-in-law.  She has a history  of obstructive sleep apnea and dementia early Alzheimer's.  Since last seen in December she has now moved in with her son and daughter-in-law.  She is no longer driving.  Meds are given to the patient.  She remains independent in dressing feeding and bathing.  She was placed on Aricept 5 mg and denies side effects to the medication.  CPAP compliance is poor.  Usage greater than 4 hours at 31%.  Average usage 4 hours 8 minutes.  Set pressure of 13 cm AHI 5.3.  Discussed compliance with daughter-in-law.  Daughter-in-law also states that since mother-in-law has moved in with them, they recognize that her memory was worse than they had expected by just seeing her in her day-to-day activities.  She returns for reevaluation [id number],01/16/2018(all dictated new, as well as above notes, some dictation done in note pad or Word, outside of chart, may appear as copied):  I reviewed her CPAP compliance data from 12/15/2017 through 01/13/2018 which is a total of 30 days, during which time she used her CPAP 27 days with percent used days greater than 4 hours at 63%, indicating suboptimal compliance but better than from before, average usage of 5 hours and 19 minutes for days on treatment, residual AHI borderline at 6.5 per hour, leak quite high with the 95th percentile at 56.7 L/m on a pressure of 13 cm with EPR of 3. She reports doing better with the CPAP but has a tendency to pull off the mask in the middle of the night, had recently noticed some bad dreams but incidentally they also were traveling quite a bit recently passed week or 2 and just recently got back this week. She has noticed no other side effect with Aricept. Her daughter-in-law has taken over her medication to make sure she takes it regularly and not accidentally twice which has happened once before. Mood and memory-wise she is stable but admits to having some frustration over losing some of her independence and not being  able to drive any longer and  having to move in with her son and his family, having to leave her house of so many years which is currently empty.  REVIEW OF SYSTEMS: Full 14 system review of systems performed and notable only for those listed, all others are neg:  Constitutional: neg  Cardiovascular: neg Ear/Nose/Throat: Hearing loss Skin: neg Eyes: neg Respiratory: neg Gastroitestinal: neg  Hematology/Lymphatic: neg  Endocrine: neg Musculoskeletal:neg Allergy/Immunology: Environmental allergies Neurological: Memory loss Psychiatric: neg Sleep : Obstructive sleep apnea with CPAP   ALLERGIES: No Known Allergies  HOME MEDICATIONS: Outpatient Medications Prior to Visit  Medication Sig Dispense Refill  . amLODipine-valsartan (EXFORGE) 5-160 MG per tablet Take 1 tablet by mouth daily.    Marland Kitchen donepezil (ARICEPT) 10 MG tablet Take 1 tablet (10 mg total) by mouth at bedtime. 90 tablet 3  . metFORMIN (GLUCOPHAGE-XR) 500 MG 24 hr tablet Take 500 mg by mouth every evening.   6  . metoprolol tartrate (LOPRESSOR) 25 MG tablet Take 25 mg by mouth 2 (two) times daily.     . Multiple Vitamins-Minerals (CENTRUM SILVER ADULT 50+) TABS Take 1 tablet by mouth daily.    . pravastatin (PRAVACHOL) 80 MG tablet 80 mg daily.    . sertraline (ZOLOFT) 50 MG tablet Take 50 mg by mouth daily.  1  . triamcinolone cream (KENALOG) 0.1 % Apply 1 application topically 2 (two) times daily. 30 g 0   No facility-administered medications prior to visit.     PAST MEDICAL HISTORY: Past Medical History:  Diagnosis Date  . Anxiety   . Arthritis    "hands"  . 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 Diabetes mellitus without complication ([place])   . High cholesterol   . HTN (hypertension)   . Malignant neoplasm of breast (female), unspecified site    left  . Osteopenia   . Sleep apnea    uses CPAP nightly    PAST SURGICAL HISTORY: Past Surgical History:  Procedure Laterality Date  . BREAST  LUMPECTOMY Right 1990's  . BREAST LUMPECTOMY WITH RADIOACTIVE SEED LOCALIZATION Left 12/29/2014   Procedure: LEFT BREAST LUMPECTOMY WITH RADIOACTIVE SEED LOCALIZATION;  Surgeon: Fanny Skates, MD;  Location: [facility];  Service: General;  Laterality: Left;  . CATARACT EXTRACTION  07/07/14   left eye  . COMBINED LAPAROSCOPY W/ HYSTEROSCOPY    . MASTECTOMY Right 02/2014  . TOTAL MASTECTOMY Right 02/09/2014   Procedure: RIGHT TOTAL MASTECTOMY;  Surgeon: Adin Hector, MD;  Location: [facility];  Service: General;  Laterality: Right;  . TUBAL LIGATION      FAMILY HISTORY: Family History  Problem Relation Age of Onset  . Hypertension Father   . CVA Father   . Diabetes Mother   . Heart disease Mother   . Diabetes Brother   . CVA Brother   . Breast cancer Paternal Aunt        dx 79s; deceased 50s  . Breast cancer Paternal Aunt        dx late 61s; deceased 15  . Leukemia Cousin        2 paternal female cousins    SOCIAL HISTORY: Social History   Socioeconomic History  . Marital status: Married    Spouse name: Not on file  . Number of children: Not on file  . Years of education: Not on file  . Highest education level: Not on file  Occupational History  . Occupation:  retired    Fish farm manager: [employer]    Comment: Professor  Social Needs  . Financial resource strain: Not on file  . Food insecurity:    Worry: Not on file    Inability: Not on file  . Transportation needs:    Medical: Not on file    Non-medical: Not on file  Tobacco Use  . Smoking status: Former Smoker    Packs/day: 0.50    Years: 30.00    Pack years: 15.00    Types: Cigarettes    Last attempt to quit: 05/06/1991    Years since quitting: 27.2  . Smokeless tobacco: Never Used  Substance and Sexual Activity  . Alcohol use: No  . Drug use: No  . Sexual activity: Not on file  Lifestyle  . Physical activity:    Days per week: Not on file    Minutes per session: Not on file  . Stress: Not on  file  Relationships  . Social connections:    Talks on phone: Not on file    Gets together: Not on file    Attends religious service: Not on file    Active member of club or organization: Not on file    Attends meetings of clubs or organizations: Not on file    Relationship status: Not on file  . Intimate partner violence:    Fear of current or ex partner: Not on file    Emotionally abused: Not on file    Physically abused: Not on file    Forced sexual activity: Not on file  Other Topics Concern  . Not on file  Social History Narrative  . Not on file     PHYSICAL EXAM  There were no vitals filed for this visit. There is no height or weight on file to calculate BMI.  Generalized: Well developed, in no acute distress , well-groomed Head: normocephalic and atraumatic,. Oropharynx benign Mallampatti 2 Neck: Supple, no carotid bruits  Cardiac: Regular rate rhythm, no murmur  Musculoskeletal: No deformity   Neurological examination   Mentation: Alert oriented to time, place, history taking. Attention span and concentration appropriate. Recent and remote memory intact.  Follows all commands speech and language fluent.  On 12/09/2014: MMSE: 29/30, AFT: 9/min.  On 06/21/2015: MMSE: 29/30, CDT: 4/4, AFT: 13/min.  On 12/20/2015: MMSE: 29/30, CDT: 4/4, AFT: 9/min.   On 06/20/2016: MMSE: 24/30, CDT: 4/4, AFT: 7/min.  On 09/25/17/ MMSE 21/30  CDT 4/4         AFT 6/min Cranial nerve II-XII: Fundoscopic exam reveals sharp disc margins.Pupils were equal round reactive to light extraocular movements were full, visual field were full on confrontational test. Facial sensation and strength were normal. hearing was intact to finger rubbing bilaterally. Uvula tongue midline. head turning and shoulder shrug were normal and symmetric.Tongue protrusion into cheek strength was normal. Motor: normal bulk and tone, full strength in the BUE, BLE,  Sensory: normal and symmetric to light touch,    Coordination: finger-nose-finger, heel-to-shin bilaterally, no dysmetria Reflexes: 1-2+ upper lower and symmetric , plantar responses were flexor bilaterally. Gait and Station: Rising up from seated position without assistance, normal stance,  moderate stride, good arm swing, smooth turning,  DIAGNOSTIC DATA (LABS, IMAGING, TESTING) - I reviewed patient records, labs, notes, testing and imaging myself where available.  Lab Results  Component Value Date   WBC 6.0 12/29/2014   HGB 14.6 12/29/2014   HCT 43.0 12/29/2014   MCV 86.6 12/29/2014   PLT 179 12/29/2014  Component Value Date/Time   NA 141 12/29/2014 0832   NA 140 12/05/2013 0911   K 4.2 12/29/2014 0832   K 4.4 12/05/2013 0911   CL 101 12/29/2014 0832   CO2 31 01/29/2014 0919   CO2 24 12/05/2013 0911   GLUCOSE 102 (H) 12/29/2014 0832   GLUCOSE 197 (H) 12/05/2013 0911   BUN 22 (H) 12/29/2014 0832   BUN 12.3 12/05/2013 0911   CREATININE 0.90 12/29/2014 0832   CREATININE 0.9 12/05/2013 0911   CALCIUM 10.0 01/29/2014 0919   CALCIUM 9.2 12/05/2013 0911   PROT 8.1 01/29/2014 0919   PROT 7.4 12/05/2013 0911   ALBUMIN 4.2 01/29/2014 0919   ALBUMIN 3.8 12/05/2013 0911   AST 31 01/29/2014 0919   AST 33 12/05/2013 0911   ALT 37 (H) 01/29/2014 0919   ALT 34 12/05/2013 0911   ALKPHOS 50 01/29/2014 0919   ALKPHOS 45 12/05/2013 0911   BILITOT 0.6 01/29/2014 0919   BILITOT 0.45 12/05/2013 0911   GFRNONAA 89 (L) 02/09/2014 1150   GFRAA >90 02/09/2014 1150    ASSESSMENT AND PLAN MYSTERY SCHRUPP is a very pleasant 78 year old female with an underlying medical history of breast cancer on the right, status post lumpectomy, chemotherapy and radiation therapy, hypertension, osteopenia, hypertension, and eczema, who presents for follow-up consultation of her obstructive sleep apnea and her memory loss. Recent neuropsychological testing  November 2018 supports the diagnosis of mild dementia of the Alzheimer's type. The patient's CPAP  compliance has declined. It has been fluctuating in the past. I would like for patient to be consistent with her CPAP as it may help her memory as well. We talked about memory loss and the diagnosis of Alzheimer's disease and long-term implications. MRI with and without contrast on 07/20/2015: IMPRESSION:  Mildly abnormal MRI brain (with and without) demonstrating: 1. Few scattered periventricular and subcortical foci of non-specific gliosis, likely chronic small vessel ischemic disease. No abnormal lesions are seen on post contrast views.  2. No acute findings.  PLAN: Increase Aricept to 10 mg daily will refill CPAP compliance 31%, please try to use it more consistently this will also help your memory No safety issues identified now that she is living with family  Follow-up in 6 months I spent 25 minutes in total face to face time with the patient/DILmore than 50% of which was spent counseling and coordination of care, reviewing test results reviewing medications and discussing and reviewing the diagnosis of obstructive sleep apnea with CPAP compliance and dementia and further treatment options. , Karen [name], GNP, [facility], APRN Karen [name] is a very pleasant 78 year old female with an underlying medical history of breast cancer on the right, status post lumpectomy, chemotherapy and radiation therapy, hypertension, osteopenia, hypertension, and eczema, who presents for follow-up consultation of her obstructive sleep apnea and her memoryloss.Recent neuropsychological testing in November 2018 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 The patient's CPAP compliance has improved since December 2018. She would like to try a full facemask again which I prescribed. She's tolerating the donepezil at 10 mg strength but may have had some recent bad dreams. She was noted to have some sleep talking and one time she was noted to yell out, this was recently during a family vacation.  She is advised to continue to try to take the medication on time and be very compliant with CPAP. Furthermore, it would help to keep a set regimen for her bedtime and rise time routine.  She can also try a little bit of melatonin at night.  She has moved in with her son's family. She does miss having her own house and being able to drive but is adjusting to her new long-term living situation. Her brain MRI with and without contrast on 07/20/2015 showed: IMPRESSION:  Mildly abnormal MRI brain (with and without) demonstrating: 1. Few scattered periventricular and subcortical foci of non-specific gliosis, likely chronic small vessel ischemic disease. No abnormal lesions are seen on post contrast views.  2. No acute findings.  She is advised to maintain good nutrition and good hydration and maintain enough sleep time. I suggested a 6 month follow-up, she can see [name], nurse practitioner. I answered all their questions today and the patient and her daughter-in-law were in agreement. [facility] [address] [place], [place] [address] [phone]

## 2018-07-31 ENCOUNTER — Telehealth: Payer: Self-pay

## 2018-07-31 ENCOUNTER — Ambulatory Visit: Payer: Medicare Other | Admitting: Nurse Practitioner

## 2018-07-31 NOTE — Telephone Encounter (Signed)
Patient was a no call/no show for their appointment today.   

## 2018-08-01 ENCOUNTER — Encounter: Payer: Self-pay | Admitting: Nurse Practitioner

## 2018-09-07 ENCOUNTER — Encounter: Payer: Self-pay | Admitting: Nurse Practitioner

## 2018-09-16 NOTE — Progress Notes (Addendum)
[facility]  PATIENT: Karen [name] DOB: 1941-06-12   REASON FOR VISIT: Follow-up for his CPAP compliance and memory loss HISTORY FROM: Patient   HISTORY OF PRESENT ILLNESS: 7/10/19SAMs. Karen [name] is a very pleasant 78 year old right-handed woman with an underlying medical history of breast cancer on the right, status post lumpectomy, chemotherapy and radiation therapy, hypertension, osteopenia, hypertension, and eczema, who presents for follow-up consultation of her memory loss and sleep apnea. The patient is accompanied by her oldest daughter-in-law again today. I last saw her on 06/27/2017, at which time I started her on Aricept 5 mg strength. She was noncompliant with her CPAP and was encouraged to be fully compliant with it.   She had an interim appointment with [name], nurse practitioner on 09/25/2017, at which time her donepezil was increased to 10 mg strength once daily.  Today,01/16/2018(all dictated new, as well as above notes, some dictation done in note pad or Word, outside of chart, may appear as copied):  I reviewed her CPAP compliance data from 12/15/2017 through 01/13/2018 which is a total of 30 days, during which time she used her CPAP 27 days with percent used days greater than 4 hours at 63%, indicating suboptimal compliance but better than from before, average usage of 5 hours and 19 minutes for days on treatment, residual AHI borderline at 6.5 per hour, leak quite high with the 95th percentile at 56.7 L/m on a pressure of 13 cm with EPR of 3. She reports doing better with the CPAP but has a tendency to pull off the mask in the middle of the night, had recently noticed some bad dreams but incidentally they also were traveling quite a bit recently passed week or 2 and just recently got back this week. She has noticed no other side effect with Aricept. Her daughter-in-law has taken over her medication to make sure she takes it regularly and not  accidentally twice which has happened once before. Mood and memory-wise she is stable but admits to having some frustration over losing some of her independence and not being able to drive any longer and having to move in with her son and his family, having to leave her house of so many years which is currently empty.  UPDATE 3/10/2020CM Ms. Karen [name], 78 year old female returns for follow-up with history of obstructive sleep apnea with CPAP and also memory loss.  She is currently on Aricept 10 mg daily without side effects.  Memory score is stable.  She reports good appetite.  She has noted to have a significant air leak with her CPAP.  She continues to have a tendency to pull the mask off in the middle of the night patient no longer drives she continues to be fairly independent with activities of daily living.  She continues to walk for exercise.  CPAP data dated 07/10/2018 to 09/07/2018 shows greater than 4 hours compliance for 29 days at 48% and 7 days at 12% for total compliance 36 out of 60 days for 60%.  Average usage 5 hours 34 minutes set pressure 13 cm EPR level 3 leak 95th percentile 56.1 AHI 7.4 ESS 7 no recent falls no balance issues.  REVIEW OF SYSTEMS: Full 14 system review of systems performed and notable only for those listed, all others are neg:  Constitutional: neg  Cardiovascular: neg Ear/Nose/Throat: Hearing loss Skin: neg Eyes: neg Respiratory: neg Gastroitestinal: neg  Hematology/Lymphatic: neg  Endocrine: neg Musculoskeletal:neg Allergy/Immunology: neg Neurological: Memory loss Psychiatric: Confusion Sleep : Obstructive sleep  apnea with CPAP   ALLERGIES: No Known Allergies  HOME MEDICATIONS: Outpatient Medications Prior to Visit  Medication Sig Dispense Refill  . amLODipine-valsartan (EXFORGE) 5-160 MG per tablet Take 1 tablet by mouth daily.    Marland Kitchen donepezil (ARICEPT) 10 MG tablet Take 1 tablet (10 mg total) by mouth at bedtime. 90 tablet 3  . metFORMIN (GLUCOPHAGE-XR)  500 MG 24 hr tablet Take 500 mg by mouth every evening.   6  . metoprolol tartrate (LOPRESSOR) 25 MG tablet Take 25 mg by mouth 2 (two) times daily.     . Multiple Vitamins-Minerals (CENTRUM SILVER ADULT 50+) TABS Take 1 tablet by mouth daily.    . pravastatin (PRAVACHOL) 80 MG tablet 80 mg daily.    . sertraline (ZOLOFT) 50 MG tablet Take 50 mg by mouth daily.  1  . triamcinolone cream (KENALOG) 0.1 % Apply 1 application topically 2 (two) times daily. 30 g 0   No facility-administered medications prior to visit.     PAST MEDICAL HISTORY: Past Medical History:  Diagnosis Date  . Anxiety   . Arthritis    "hands"  . 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 Diabetes mellitus without complication ([place])   . High cholesterol   . HTN (hypertension)   . Malignant neoplasm of breast (female), unspecified site    left  . Osteopenia   . Sleep apnea    uses CPAP nightly    PAST SURGICAL HISTORY: Past Surgical History:  Procedure Laterality Date  . BREAST LUMPECTOMY Right 1990's  . BREAST LUMPECTOMY WITH RADIOACTIVE SEED LOCALIZATION Left 12/29/2014   Procedure: LEFT BREAST LUMPECTOMY WITH RADIOACTIVE SEED LOCALIZATION;  Surgeon: Fanny Skates, MD;  Location: [facility];  Service: General;  Laterality: Left;  . CATARACT EXTRACTION  07/07/14   left eye  . COMBINED LAPAROSCOPY W/ HYSTEROSCOPY    . MASTECTOMY Right 02/2014  . TOTAL MASTECTOMY Right 02/09/2014   Procedure: RIGHT TOTAL MASTECTOMY;  Surgeon: Adin Hector, MD;  Location: [facility];  Service: General;  Laterality: Right;  . TUBAL LIGATION      FAMILY HISTORY: Family History  Problem Relation Age of Onset  . Hypertension Father   . CVA Father   . Diabetes Mother   . Heart disease Mother   . Diabetes Brother   . CVA Brother   . Breast cancer Paternal Aunt        dx 50s; deceased 80s  . Breast cancer Paternal Aunt        dx late 63s; deceased 39  . Leukemia  Cousin        2 paternal female cousins    SOCIAL HISTORY: Social History   Socioeconomic History  . Marital status: Married    Spouse name: Not on file  . Number of children: Not on file  . Years of education: Not on file  . Highest education level: Not on file  Occupational History  . Occupation: retired    Fish farm manager: [employer]    Comment: Professor  Social Needs  . Financial resource strain: Not on file  . Food insecurity:    Worry: Not on file    Inability: Not on file  . Transportation needs:    Medical: Not on file    Non-medical: Not on file  Tobacco Use  . Smoking status: Former Smoker    Packs/day: 0.50    Years: 30.00    Pack years: 15.00  Types: Cigarettes    Last attempt to quit: 05/06/1991    Years since quitting: 27.3  . Smokeless tobacco: Never Used  Substance and Sexual Activity  . Alcohol use: No  . Drug use: No  . Sexual activity: Not on file  Lifestyle  . Physical activity:    Days per week: Not on file    Minutes per session: Not on file  . Stress: Not on file  Relationships  . Social connections:    Talks on phone: Not on file    Gets together: Not on file    Attends religious service: Not on file    Active member of club or organization: Not on file    Attends meetings of clubs or organizations: Not on file    Relationship status: Not on file  . Intimate partner violence:    Fear of current or ex partner: Not on file    Emotionally abused: Not on file    Physically abused: Not on file    Forced sexual activity: Not on file  Other Topics Concern  . Not on file  Social History Narrative  . Not on file     PHYSICAL EXAM  Vitals:   09/17/18 1358  BP: (!) 129/58  Pulse: (!) 57  Weight: 109 lb 9.6 oz (49.7 kg)  Height: 4\' 9"  (1.448 m)   Body mass index is 23.72 kg/m.  Generalized: Well developed, in no acute distress  Head: normocephalic and atraumatic,. Oropharynx benign  Neck: Supple, no carotid bruits  Cardiac:  Regular rate rhythm, no murmur  Musculoskeletal: No deformity   Neurological examination   Mentation: Alert oriented to time, place, history taking. Attention span and concentration appropriate. Recent and remote memory intact.  Follows all commands speech and language fluent.  On 12/09/2014: MMSE: 29/30, AFT: 9/min.  On 06/21/2015: MMSE: 29/30, CDT: 4/4, AFT: 13/min.  On 12/20/2015: MMSE: 29/30, CDT: 4/4, AFT: 9/min.   On 06/20/2016: MMSE: 24/30, CDT: 4/4, AFT: 7/min.  On 09/25/17: MMSE: 21/30, CDT: 4/4, AFT: 6/min. ON 09/17/2018 MMSE 23/30 CDT 3/4 AFT 5 /min Cranial nerve II-XII: Pupils were equal round reactive to light extraocular movements were full, visual field were full on confrontational test. Facial sensation and strength were normal. hearing was intact to finger rubbing bilaterally. Uvula tongue midline. head turning and shoulder shrug were normal and symmetric.Tongue protrusion into cheek strength was normal. Motor: normal bulk and tone, full strength in the BUE, BLE, Sensory: normal and symmetric to light touch, pinprick, and  Vibration, in the upper and lower extremities Coordination: finger-nose-finger, heel-to-shin bilaterally, no dysmetria Reflexes: Symmetric upper and lower plantar responses were flexor bilaterally. Gait and Station: Rising up from seated position without assistance, normal stance,  moderate stride, good arm swing, smooth turning, able to perform tiptoe, and heel walking without difficulty. Tandem gait is steady, no assistive device  DIAGNOSTIC DATA (LABS, IMAGING, TESTING) - I reviewed patient records, labs, notes, testing and imaging myself where available.  Lab Results  Component Value Date   WBC 6.0 12/29/2014   HGB 14.6 12/29/2014   HCT 43.0 12/29/2014   MCV 86.6 12/29/2014   PLT 179 12/29/2014      Component Value Date/Time   NA 141 12/29/2014 0832   NA 140 12/05/2013 0911   K 4.2 12/29/2014 0832   K 4.4 12/05/2013 0911   CL 101  12/29/2014 0832   CO2 31 01/29/2014 0919   CO2 24 12/05/2013 0911   GLUCOSE 102 (H) 12/29/2014 9833  GLUCOSE 197 (H) 12/05/2013 0911   BUN 22 (H) 12/29/2014 0832   BUN 12.3 12/05/2013 0911   CREATININE 0.90 12/29/2014 0832   CREATININE 0.9 12/05/2013 0911   CALCIUM 10.0 01/29/2014 0919   CALCIUM 9.2 12/05/2013 0911   PROT 8.1 01/29/2014 0919   PROT 7.4 12/05/2013 0911   ALBUMIN 4.2 01/29/2014 0919   ALBUMIN 3.8 12/05/2013 0911   AST 31 01/29/2014 0919   AST 33 12/05/2013 0911   ALT 37 (H) 01/29/2014 0919   ALT 34 12/05/2013 0911   ALKPHOS 50 01/29/2014 0919   ALKPHOS 45 12/05/2013 0911   BILITOT 0.6 01/29/2014 0919   BILITOT 0.45 12/05/2013 0911   GFRNONAA 89 (L) 02/09/2014 1150   GFRAA >90 02/09/2014 1150    ASSESSMENT AND PLAN ANETTA OLVERA is a very pleasant 78 year old female with an underlying medical history of breast cancer on the right, status post lumpectomy, chemotherapy and radiation therapy, hypertension, osteopenia, hypertension, and eczema, who presents for follow-up consultation of her obstructive sleep apnea and her memoryloss.Recent neuropsychological testing in November 2018 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 The patient's CPAP compliance remains suboptimal.  She was encouraged to try to be compliant with her CPAP She has a significant leak . CPAP data dated 07/10/2018 to 09/07/2018 shows greater than 4 hours compliance for 29 days at 48% and 7 days at 12% for total compliance 36 out of 60 days for 60%.  Average usage 5 hours 34 minutes set pressure 13 cm EPR level 3 leak 95th percentile 56.1 AHI 7.4 ESS 7 She's tolerating the donepezil at 10 mg strength  without side effects .  She remains with her son's family. She does miss having her own house and being able to drive but is adjusting to her new long-term living situation. Her brain MRI with and without contrast on 07/20/2015  without acute findings.     PLAN: CPAP compliance 48%  greater than 4 hours in 60 days Patient has a significant leak needs mask refit will order Continue Aricept 10 mg daily will refill Continue to walk every day for exercise, maintain good nutrition and good hydration and also get adequate sleep. Follow-up in 6 months I spent 25 minutes in total face to face time with the patient /family member more than 50% of which was spent counseling and coordination of care, reviewing test results reviewing medications and discussing and reviewing the diagnosis of dementia and obstructive sleep apnea.  No safety issues were identified, her current environment seems to be stable.  She reads for memory stimulation and was advised to continue that. Dennie Bible, [facility], [facility], APRN  [facility] [address] [place], [place] [address] [phone]  I reviewed the above note and documentation by the Nurse Practitioner and agree with the history, physical exam, assessment and plan as outlined above. I was immediately available for face-to-face consultation. Star Age, MD, PhD [facility])

## 2018-09-17 ENCOUNTER — Encounter: Payer: Self-pay | Admitting: Nurse Practitioner

## 2018-09-17 ENCOUNTER — Ambulatory Visit: Payer: Medicare Other | Admitting: Nurse Practitioner

## 2018-09-17 VITALS — BP 129/58 | HR 57 | Ht <= 58 in | Wt 109.6 lb

## 2018-09-17 DIAGNOSIS — G4733 Obstructive sleep apnea (adult) (pediatric): Secondary | ICD-10-CM

## 2018-09-17 DIAGNOSIS — R413 Other amnesia: Secondary | ICD-10-CM

## 2018-09-17 DIAGNOSIS — Z9989 Dependence on other enabling machines and devices: Secondary | ICD-10-CM

## 2018-09-17 MED ORDER — DONEPEZIL HCL 10 MG PO TABS: 10.0000 mg | ORAL_TABLET | Freq: Every day | ORAL | 2 refills | Status: DC

## 2018-09-17 NOTE — Patient Instructions (Signed)
CPAP compliance 48% greater than 4 hours in 60 days Patient has a significant leak needs mask refit You Aricept 10 mg daily will refill Continue to walk every day for exercise Follow-up in 6 months

## 2018-09-18 NOTE — Progress Notes (Signed)
Received confirmation that received new cpap orders.

## 2019-03-20 ENCOUNTER — Ambulatory Visit: Payer: Medicare Other | Admitting: Family Medicine

## 2019-03-31 ENCOUNTER — Ambulatory Visit (INDEPENDENT_AMBULATORY_CARE_PROVIDER_SITE_OTHER): Payer: Medicare Other | Admitting: Family Medicine

## 2019-03-31 ENCOUNTER — Other Ambulatory Visit: Payer: Self-pay

## 2019-03-31 ENCOUNTER — Encounter: Payer: Self-pay | Admitting: Family Medicine

## 2019-03-31 VITALS — BP 122/63 | HR 51 | Temp 97.0°F | Ht <= 58 in

## 2019-03-31 DIAGNOSIS — G301 Alzheimer's disease with late onset: Secondary | ICD-10-CM

## 2019-03-31 DIAGNOSIS — Z9989 Dependence on other enabling machines and devices: Secondary | ICD-10-CM | POA: Diagnosis not present

## 2019-03-31 DIAGNOSIS — F0280 Dementia in other diseases classified elsewhere without behavioral disturbance: Secondary | ICD-10-CM

## 2019-03-31 DIAGNOSIS — G4733 Obstructive sleep apnea (adult) (pediatric): Secondary | ICD-10-CM

## 2019-03-31 NOTE — Progress Notes (Addendum)
PATIENT: Karen [name] DOB: 03/09/1941  REASON FOR VISIT: follow up HISTORY FROM: patient  Chief Complaint  Patient presents with  . Follow-up    pt with daughter, rm 2 (DR Rexene Alberts RM),      HISTORY OF PRESENT ILLNESS: Today 03/31/19 Karen [name] is a 78 y.o. female here today for follow up of OSA on CPAP and AD. She continues CPAP therapy at home. She does admit to pulling CPAP mask off at night. She suffers from Alzheimer's. She is doing fairly well on Aricept 10mg  daily. She lives with her son and daughter-in-law.  She is able to dress and bathe herself.  She does require assistance with dosing medications.  She does not drive.  Her daughter-in-law does note that she is eating less than she used to.  There is been no significant weight loss that they have noted.  She is more sleepy during the day.  She is not exercising as much as she used to.  Plans report dated 02/24/2019 through 03/25/2019 reveals that she is using CPAP 20 out of last 30 days for compliance of 67%.  7 days she used CPAP greater than 4 hours for compliance of 23%.  Average usage was 3 hours and 49 minutes.  AHI is 10.5 on 13 cm of water and an EPR of 3.  There was a significant leak noted in the 95th percentile at 51.1.  HISTORY: (copied from [employer] note on 09/17/2018)  7/10/19SAMs. Karen [name] is a very pleasant 78 year old right-handed woman with an underlying medical history of breast cancer on the right, status post lumpectomy, chemotherapy and radiation therapy, hypertension, osteopenia, hypertension, and eczema, who presents for follow-up consultation of her memory loss and sleep apnea. The patient is accompanied by her oldest daughter-in-law again today. I last saw her on 06/27/2017, at which time I started her on Aricept 5 mg strength. She was noncompliant with her CPAP and was encouraged to be fully compliant with it.   She had an interim appointment with [name], nurse practitioner on 09/25/2017,  at which time her donepezil was increased to 10 mg strength once daily.  Today,01/16/2018(all dictated new, as well as above notes, some dictation done in note pad or Word, outside of chart, may appear as copied):  I reviewed her CPAP compliance data from 12/15/2017 through 01/13/2018 which is a total of 30 days, during which time she used her CPAP 27 days with percent used days greater than 4 hours at 63%, indicating suboptimal compliance but better than from before, average usage of 5 hours and 19 minutes for days on treatment, residual AHI borderline at 6.5 per hour, leak quite high with the 95thpercentile at 56.7 L/m on a pressure of 13 cm with EPR of 3. She reports doing better with the CPAP but has a tendency to pull off the mask in the middle of the night, had recently noticed some bad dreams but incidentally they also were traveling quite a bit recently passed week or 2 and just recently got back this week. She has noticed no other side effect with Aricept. Her daughter-in-law has taken over her medication to make sure she takes it regularly and not accidentally twice which has happened once before.Mood and memory-wise she is stable but admits to having some frustration over losing some of her independence and not being able to drive any longer and having to move in with her son and his family, having to leave her house of so many  years which is currently empty.  UPDATE 3/10/2020[name], 78 year old female returns for follow-up with history of obstructive sleep apnea with CPAP and also memory loss.  She is currently on Aricept 10 mg daily without side effects.  Memory score is stable.  She reports good appetite.  She has noted to have a significant air leak with her CPAP.  She continues to have a tendency to pull the mask off in the middle of the night patient no longer drives she continues to be fairly independent with activities of daily living.  She continues to walk for exercise.  CPAP  data dated 07/10/2018 to 09/07/2018 shows greater than 4 hours compliance for 29 days at 48% and 7 days at 12% for total compliance 36 out of 60 days for 60%.  Average usage 5 hours 34 minutes set pressure 13 cm EPR level 3 leak 95th percentile 56.1 AHI 7.4 ESS 7 no recent falls no balance issues.   REVIEW OF SYSTEMS: Out of a complete 14 system review of symptoms, the patient complains only of the following symptoms, memory loss and all other reviewed systems are negative.  ALLERGIES: No Known Allergies  HOME MEDICATIONS: Outpatient Medications Prior to Visit  Medication Sig Dispense Refill  . amLODipine-valsartan (EXFORGE) 5-160 MG per tablet Take 1 tablet by mouth daily.    Marland Kitchen donepezil (ARICEPT) 10 MG tablet Take 1 tablet (10 mg total) by mouth at bedtime. 90 tablet 2  . metFORMIN (GLUCOPHAGE-XR) 500 MG 24 hr tablet Take 500 mg by mouth every evening.   6  . metoprolol tartrate (LOPRESSOR) 25 MG tablet Take 25 mg by mouth 2 (two) times daily.     . Multiple Vitamins-Minerals (CENTRUM SILVER ADULT 50+) TABS Take 1 tablet by mouth daily.    . pravastatin (PRAVACHOL) 80 MG tablet 80 mg daily.    . sertraline (ZOLOFT) 50 MG tablet Take 100 mg by mouth daily.   1  . triamcinolone cream (KENALOG) 0.1 % Apply 1 application topically 2 (two) times daily. 30 g 0   No facility-administered medications prior to visit.     PAST MEDICAL HISTORY: Past Medical History:  Diagnosis Date  . Anxiety   . Arthritis    "hands"  . 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 Diabetes mellitus without complication ([place])   . High cholesterol   . HTN (hypertension)   . Malignant neoplasm of breast (female), unspecified site    left  . Osteopenia   . Sleep apnea    uses CPAP nightly    PAST SURGICAL HISTORY: Past Surgical History:  Procedure Laterality Date  . BREAST LUMPECTOMY Right 1990's  . BREAST LUMPECTOMY WITH RADIOACTIVE SEED LOCALIZATION Left 12/29/2014    Procedure: LEFT BREAST LUMPECTOMY WITH RADIOACTIVE SEED LOCALIZATION;  Surgeon: Fanny Skates, MD;  Location: [place];  Service: General;  Laterality: Left;  . CATARACT EXTRACTION  07/07/14   left eye  . COMBINED LAPAROSCOPY W/ HYSTEROSCOPY    . MASTECTOMY Right 02/2014  . TOTAL MASTECTOMY Right 02/09/2014   Procedure: RIGHT TOTAL MASTECTOMY;  Surgeon: Adin Hector, MD;  Location: [place];  Service: General;  Laterality: Right;  . TUBAL LIGATION      FAMILY HISTORY: Family History  Problem Relation Age of Onset  . Hypertension Father   . CVA Father   . Diabetes Mother   . Heart disease Mother   . Diabetes Brother   . CVA Brother   .  Breast cancer Paternal Aunt        dx 76s; deceased 10s  . Breast cancer Paternal Aunt        dx late 72s; deceased 36  . Leukemia Cousin        2 paternal female cousins    SOCIAL HISTORY: Social History   Socioeconomic History  . Marital status: Married    Spouse name: Not on file  . Number of children: Not on file  . Years of education: Not on file  . Highest education level: Not on file  Occupational History  . 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 Financial resource strain: Not on file  . Food insecurity    Worry: Not on file    Inability: Not on file  . Transportation needs    Medical: Not on file    Non-medical: Not on file  Tobacco Use  . Smoking status: Former Smoker    Packs/day: 0.50    Years: 30.00    Pack years: 15.00    Types: Cigarettes    Quit date: 05/06/1991    Years since quitting: 27.9  . Smokeless tobacco: Never Used  Substance and Sexual Activity  . Alcohol use: No  . Drug use: No  . Sexual activity: Not on file  Lifestyle  . Physical activity    Days per week: Not on file    Minutes per session: Not on file  . Stress: Not on file  Relationships  . Social Herbalist on phone: Not on file    Gets together: Not on file     Attends religious service: Not on file    Active member of club or organization: Not on file    Attends meetings of clubs or organizations: Not on file    Relationship status: Not on file  . Intimate partner violence    Fear of current or ex partner: Not on file    Emotionally abused: Not on file    Physically abused: Not on file    Forced sexual activity: Not on file  Other Topics Concern  . Not on file  Social History Narrative  . Not on file      PHYSICAL EXAM  Vitals:   03/31/19 1526  BP: 122/63  Pulse: (!) 51  Temp: (!) 97 F (36.1 C)  Height: 4\' 10"  (1.473 m)   Body mass index is 22.91 kg/m.  Generalized: Well developed, in no acute distress  Cardiology: normal rate and rhythm, no murmur noted Respiratory: Clear to auscultation bilaterally Mallampati 3+, neck circ 13" Neurological examination  Mentation: Alert oriented to time, place, history taking. Follows all commands speech and language fluent Cranial nerve II-XII: Pupils were equal round reactive to light. Extraocular movements were full, visual field were full on confrontational test. Facial sensation and strength were normal. Uvula tongue midline. Head turning and shoulder shrug  were normal and symmetric. Motor: The motor testing reveals 5 over 5 strength of all 4 extremities. Good symmetric motor tone is noted throughout.  Sensory: Sensory testing is intact to soft touch on all 4 extremities. No evidence of extinction is noted.  Coordination: Cerebellar testing reveals good finger-nose-finger and heel-to-shin bilaterally.  Gait and station: Gait is normal. Tandem gait not attempted. Romberg is negative. No drift is seen.     DIAGNOSTIC DATA (LABS, IMAGING, TESTING) - I reviewed patient records, labs, notes, testing and imaging myself  where available.  MMSE - Mini Mental State Exam 03/31/2019 09/17/2018 09/25/2017  Orientation to time 1 3 2   Orientation to Place 4 4 4   Registration 3 3 3   Attention/  Calculation 4 5 4   Recall 0 0 0  Language- name 2 objects 2 2 2   Language- repeat 0 1 0  Language- follow 3 step command 3 3 3   Language- read & follow direction 1 1 1   Write a sentence 1 1 1   Copy design 1 0 1  Total score 20 23 21      Lab Results  Component Value Date   WBC 6.0 12/29/2014   HGB 14.6 12/29/2014   HCT 43.0 12/29/2014   MCV 86.6 12/29/2014   PLT 179 12/29/2014      Component Value Date/Time   NA 141 12/29/2014 0832   NA 140 12/05/2013 0911   K 4.2 12/29/2014 0832   K 4.4 12/05/2013 0911   CL 101 12/29/2014 0832   CO2 31 01/29/2014 0919   CO2 24 12/05/2013 0911   GLUCOSE 102 (H) 12/29/2014 0832   GLUCOSE 197 (H) 12/05/2013 0911   BUN 22 (H) 12/29/2014 0832   BUN 12.3 12/05/2013 0911   CREATININE 0.90 12/29/2014 0832   CREATININE 0.9 12/05/2013 0911   CALCIUM 10.0 01/29/2014 0919   CALCIUM 9.2 12/05/2013 0911   PROT 8.1 01/29/2014 0919   PROT 7.4 12/05/2013 0911   ALBUMIN 4.2 01/29/2014 0919   ALBUMIN 3.8 12/05/2013 0911   AST 31 01/29/2014 0919   AST 33 12/05/2013 0911   ALT 37 (H) 01/29/2014 0919   ALT 34 12/05/2013 0911   ALKPHOS 50 01/29/2014 0919   ALKPHOS 45 12/05/2013 0911   BILITOT 0.6 01/29/2014 0919   BILITOT 0.45 12/05/2013 0911   GFRNONAA 89 (L) 02/09/2014 1150   GFRAA >90 02/09/2014 1150   No results found for: CHOL, HDL, LDLCALC, LDLDIRECT, TRIG, CHOLHDL No results found for: HGBA1C No results found for: VITAMINB12 No results found for: TSH     ASSESSMENT AND PLAN 78 y.o. year old female  has a past medical history of Anxiety, Arthritis, DCIS (ductal carcinoma in situ), Diabetes mellitus without complication ([place]), High cholesterol, HTN (hypertension), Malignant neoplasm of breast (female), unspecified site, Osteopenia, and Sleep apnea. here with     ICD-10-CM   1. OSA on CPAP  G47.33    Z99.89   2. Late onset Alzheimer's disease without behavioral disturbance ([place])  G30.1    F02.80     Riquel is doing fairly well  overall.  She continues to have memory loss.  Memory score today is fairly stable.  MMSE in March 2020 was 23.  Today is 21.  We will continue Aricept 10 mg daily.  We have discussed adding Namenda.  We have also discussed potential participation in research study.  Her daughter-in-law would like to have more information about research study prior to deciding on adding medications.  We have reached out to the research department he will contact family with more information.  I have encouraged her daughter-in-law to help remind her to use CPAP nightly.  She should use CPAP for at least 4 hours each night.  They verbalized understanding.  We have scheduled a 75-month follow-up.  Luzclarita and her daughter-in-law both verbalized understanding and agreement with this plan.   No orders of the defined types were placed in this encounter.    No orders of the defined types were placed in this encounter.  I spent 15 minutes with the patient. 50% of this time was spent counseling and educating patient on plan of care and medications.    Debbora Presto, FNP-C 03/31/2019, 4:39 PM [facility] [address] [place], [place] [address] [phone]  I reviewed the above note and documentation by the Nurse Practitioner and agree with the history, exam, assessment and plan as outlined above. I was immediately available for consultation. Star Age, MD, PhD [facility])

## 2019-03-31 NOTE — Patient Instructions (Signed)
Continue Aricept 10mg  daily  Continue CPAP nightly and for greater than 4 hours each night  Follow up in 6 months   Alzheimer Disease Caregiver Guide  Alzheimer disease causes a person to lose the ability to remember things and make decisions. A person who has Alzheimer disease may not be able to take care of himself or herself. He or she may need help with simple tasks. The tips below can help you care for the person. What kind of changes does this condition cause? This condition makes a person:  Forget things.  Feel confused.  Act differently.  Have different moods. These things get worse with time. Tips to help with symptoms  Be calm and patient.  Respond with a simple, short answer.  Avoid correcting the person in a negative way.  Try not to take things personally, even if the person forgets your name.  Do not argue with the person. This may make the person more upset. Tips to lessen frustration  Make appointments and do daily tasks when the person is at his or her best.  Take your time. Simple tasks may take longer. Allow plenty of time to complete tasks.  Limit choices for the person.  Involve the person in what you are doing.  Keep a daily routine.  Avoid new or crowded places, if possible.  Use simple words, short sentences, and a calm voice. Only give one direction at a time.  Buy clothes and shoes that are easy to put on and take off.  Organize medicines in a pillbox for each day of the week.  Keep a calendar in a central location to remind the person of meetings or other activities.  Let people help if they offer. Take a break when needed. Tips to prevent injury  Keep floors clear. Remove rugs, magazine racks, and floor lamps.  Keep hallways well-lit.  Put a handrail and non-slip mat in the bathtub or shower.  Put childproof locks on cabinets that have dangerous items in them. These items include medicine, alcohol, guns, toxic cleaning items,  sharp tools, matches, and lighters.  Put locks on doors where the person cannot see or reach them. This helps the person to not wander out of the house and get lost.  Be prepared for emergencies. Keep a list of emergency phone numbers and addresses close by.  Bracelets may be worn that track location and identify the person as having memory problems. This should be worn at all times for safety. Tips for the future  Discuss financial and legal planning early. People with this disease have trouble managing their money as the disease gets worse. Get help from a professional.  Talk about advance directives, safety, and daily care. Take these steps: ? Create a living will and choose a power of attorney. This is someone who can make decisions for the person with Alzheimer disease when he or she can no longer do so. ? Discuss driving safety and when to stop driving. The person's doctor can help with this. ? If the person lives alone, make sure he or she is safe. Some people need extra help at home. Other people need more care at a nursing home or care center. Where to find support You can find support by joining a support group near you. Some benefits of joining a support group include:  Learning ways to manage stress.  Sharing experiences with others.  Getting emotional comfort and support.  Learning about caregiving as the disease progresses.  Knowing what community resources are available and making use of them. Where to find more information  Alzheimer's Association: [employer] Contact a doctor if:  The person has a fever.  The person has a sudden behavior change that does not get better with calming strategies.  The person is not able to take care of himself or herself at home.  The person threatens you or anyone else, including himself or herself.  You are no longer able to care for the person. Summary  Alzheimer disease causes a person to forget things and to be confused.   A person who has this condition may not be able to take care of himself or herself.  Take steps to keep the person from getting hurt. Plan for future care.  You can find support by joining a support group near you. This information is not intended to replace advice given to you by your health care provider. Make sure you discuss any questions you have with your health care provider. Document Released: 09/18/2011 Document Revised: 10/15/2018 Document Reviewed: 06/21/2017 Elsevier Patient Education  [place]. Sleep Apnea Sleep apnea affects breathing during sleep. It causes breathing to stop for a short time or to become shallow. It can also increase the risk of:  Heart attack.  Stroke.  Being very overweight (obese).  Diabetes.  Heart failure.  Irregular heartbeat. The goal of treatment is to help you breathe normally again. What are the causes? There are three kinds of sleep apnea:  Obstructive sleep apnea. This is caused by a blocked or collapsed airway.  Central sleep apnea. This happens when the brain does not send the right signals to the muscles that control breathing.  Mixed sleep apnea. This is a combination of obstructive and central sleep apnea. The most common cause of this condition is a collapsed or blocked airway. This can happen if:  Your throat muscles are too relaxed.  Your tongue and tonsils are too large.  You are overweight.  Your airway is too small. What increases the risk?  Being overweight.  Smoking.  Having a small airway.  Being older.  Being female.  Drinking alcohol.  Taking medicines to calm yourself (sedatives or tranquilizers).  Having family members with the condition. What are the signs or symptoms?  Trouble staying asleep.  Being sleepy or tired during the day.  Getting angry a lot.  Loud snoring.  Headaches in the morning.  Not being able to focus your mind (concentrate).  Forgetting things.  Less  interest in sex.  Mood swings.  Personality changes.  Feelings of sadness (depression).  Waking up a lot during the night to pee (urinate).  Dry mouth.  Sore throat. How is this diagnosed?  Your medical history.  A physical exam.  A test that is done when you are sleeping (sleep study). The test is most often done in a sleep lab but may also be done at home. How is this treated?   Sleeping on your side.  Using a medicine to get rid of mucus in your nose (decongestant).  Avoiding the use of alcohol, medicines to help you relax, or certain pain medicines (narcotics).  Losing weight, if needed.  Changing your diet.  Not smoking.  Using a machine to open your airway while you sleep, such as: ? An oral appliance. This is a mouthpiece that shifts your lower jaw forward. ? A CPAP device. This device blows air through a mask when you breathe out (exhale). ? An EPAP  device. This has valves that you put in each nostril. ? A BPAP device. This device blows air through a mask when you breathe in (inhale) and breathe out.  Having surgery if other treatments do not work. It is important to get treatment for sleep apnea. Without treatment, it can lead to:  High blood pressure.  Coronary artery disease.  In men, not being able to have an erection (impotence).  Reduced thinking ability. Follow these instructions at home: Lifestyle  Make changes that your doctor recommends.  Eat a healthy diet.  Lose weight if needed.  Avoid alcohol, medicines to help you relax, and some pain medicines.  Do not use any products that contain nicotine or tobacco, such as cigarettes, e-cigarettes, and chewing tobacco. If you need help quitting, ask your doctor. General instructions  Take over-the-counter and prescription medicines only as told by your doctor.  If you were given a machine to use while you sleep, use it only as told by your doctor.  If you are having surgery, make sure to  tell your doctor you have sleep apnea. You may need to bring your device with you.  Keep all follow-up visits as told by your doctor. This is important. Contact a doctor if:  The machine that you were given to use during sleep bothers you or does not seem to be working.  You do not get better.  You get worse. Get help right away if:  Your chest hurts.  You have trouble breathing in enough air.  You have an uncomfortable feeling in your back, arms, or stomach.  You have trouble talking.  One side of your body feels weak.  A part of your face is hanging down. These symptoms may be an emergency. Do not wait to see if the symptoms will go away. Get medical help right away. Call your local emergency services (911 in the [place] Do not drive yourself to the hospital. Summary  This condition affects breathing during sleep.  The most common cause is a collapsed or blocked airway.  The goal of treatment is to help you breathe normally while you sleep. This information is not intended to replace advice given to you by your health care provider. Make sure you discuss any questions you have with your health care provider. Document Released: 04/04/2008 Document Revised: 04/12/2018 Document Reviewed: 02/19/2018 Elsevier Patient Education  2020 [employer].

## 2019-05-21 ENCOUNTER — Other Ambulatory Visit: Payer: Self-pay | Admitting: Family Medicine

## 2019-05-21 ENCOUNTER — Encounter: Payer: Self-pay | Admitting: Family Medicine

## 2019-05-21 MED ORDER — MEMANTINE HCL 28 X 5 MG & 21 X 10 MG PO TABS
ORAL_TABLET | ORAL | 12 refills | Status: DC
Start: 2019-05-21 — End: 2019-09-30

## 2019-08-11 ENCOUNTER — Other Ambulatory Visit: Payer: Self-pay

## 2019-08-11 ENCOUNTER — Emergency Department (HOSPITAL_COMMUNITY)
Admission: EM | Admit: 2019-08-11 | Discharge: 2019-08-11 | Disposition: A | Discharge status: Home / Self Care | Payer: Medicare PPO | Attending: Emergency Medicine | Admitting: Emergency Medicine

## 2019-08-11 ENCOUNTER — Emergency Department (HOSPITAL_COMMUNITY): Payer: Medicare PPO

## 2019-08-11 ENCOUNTER — Encounter (HOSPITAL_COMMUNITY): Payer: Self-pay

## 2019-08-11 DIAGNOSIS — I10 Essential (primary) hypertension: Secondary | ICD-10-CM | POA: Diagnosis not present

## 2019-08-11 DIAGNOSIS — Y939 Activity, unspecified: Secondary | ICD-10-CM | POA: Insufficient documentation

## 2019-08-11 DIAGNOSIS — S01319A Laceration without foreign body of unspecified ear, initial encounter: Secondary | ICD-10-CM

## 2019-08-11 DIAGNOSIS — E119 Type 2 diabetes mellitus without complications: Secondary | ICD-10-CM | POA: Diagnosis not present

## 2019-08-11 DIAGNOSIS — Y92019 Unspecified place in single-family (private) house as the place of occurrence of the external cause: Secondary | ICD-10-CM | POA: Insufficient documentation

## 2019-08-11 DIAGNOSIS — W19XXXA Unspecified fall, initial encounter: Secondary | ICD-10-CM | POA: Insufficient documentation

## 2019-08-11 DIAGNOSIS — Y999 Unspecified external cause status: Secondary | ICD-10-CM | POA: Insufficient documentation

## 2019-08-11 DIAGNOSIS — Z87891 Personal history of nicotine dependence: Secondary | ICD-10-CM | POA: Diagnosis not present

## 2019-08-11 DIAGNOSIS — S01311A Laceration without foreign body of right ear, initial encounter: Secondary | ICD-10-CM | POA: Insufficient documentation

## 2019-08-11 DIAGNOSIS — S0990XA Unspecified injury of head, initial encounter: Secondary | ICD-10-CM | POA: Diagnosis present

## 2019-08-11 LAB — COMPREHENSIVE METABOLIC PANEL
ALT: 21 U/L (ref 0–44)
AST: 31 U/L (ref 15–41)
Albumin: 4.5 g/dL (ref 3.5–5.0)
Alkaline Phosphatase: 41 U/L (ref 38–126)
Anion gap: 12 (ref 5–15)
BUN: 21 mg/dL (ref 8–23)
CO2: 26 mmol/L (ref 22–32)
Calcium: 9.1 mg/dL (ref 8.9–10.3)
Chloride: 102 mmol/L (ref 98–111)
Creatinine, Ser: 0.9 mg/dL (ref 0.44–1.00)
GFR calc Af Amer: >60 mL/min (ref >60)
GFR calc non Af Amer: >60 mL/min (ref >60)
Glucose, Bld: 105 mg/dL — ABNORMAL HIGH (ref 70–99)
Potassium: 3.7 mmol/L (ref 3.5–5.1)
Sodium: 140 mmol/L (ref 135–145)
Total Bilirubin: 0.9 mg/dL (ref 0.3–1.2)
Total Protein: 7.9 g/dL (ref 6.5–8.1)

## 2019-08-11 LAB — CBC
HCT: 38 % (ref 36.0–46.0)
Hemoglobin: 12.4 g/dL (ref 12.0–15.0)
MCH: 29.7 pg (ref 26.0–34.0)
MCHC: 32.6 g/dL (ref 30.0–36.0)
MCV: 91.1 fL (ref 80.0–100.0)
Platelets: 163 10*3/uL (ref 150–400)
RBC: 4.17 MIL/uL (ref 3.87–5.11)
RDW: 13.4 % (ref 11.5–15.5)
WBC: 5.6 10*3/uL (ref 4.0–10.5)
nRBC: 0 % (ref 0.0–0.2)

## 2019-08-11 MED ORDER — LIDOCAINE HCL 2 % IJ SOLN
20.0000 mL | Freq: Once | INTRAMUSCULAR | Status: AC
  Administered 2019-08-11: 400 mg
  Filled 2019-08-11: qty 20

## 2019-08-11 NOTE — Discharge Instructions (Addendum)
You were evaluated in the Emergency Department and after careful evaluation, we did not find any emergent condition requiring admission or further testing in the hospital.  Your exam/testing today was overall reassuring.  As discussed, please leave the dressing on the ear until tomorrow morning.  Use Tylenol or Motrin for discomfort.  Please return to the Emergency Department if you experience any worsening of your condition.  We encourage you to follow up with a primary care provider.  Thank you for allowing [place] to be a part of your care.

## 2019-08-11 NOTE — ED Provider Notes (Signed)
[facility] Emergency Department Provider Note MRN:  [id number]  Arrival date & time: 08/11/19     Chief Complaint   Fall and Ear Laceration   History of Present Illness   Karen [name] is a 79 y.o. year-old female with a history of diabetes, breast cancer, dementia presenting to the ED with chief complaint of fall.  Patient and family do not know how or why she fell.  Unwitnessed fall.  Approached family this morning with bleeding ear.  Sent here from urgent care for repair of the ear laceration.  Patient is more confused than normal.  No vomiting, no chest pain or shortness of breath, no abdominal pain, no numbness or weakness to the arms or legs.  I was unable to obtain an accurate HPI, PMH, or ROS due to the patient's altered mental status.  Level 5 caveat.  Review of Systems  Positive for fall, ear laceration, confusion.  Patient's Health History    Past Medical History:  Diagnosis Date  . Anxiety   . Arthritis    "hands"  . 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 Diabetes mellitus without complication ([place])   . High cholesterol   . HTN (hypertension)   . Malignant neoplasm of breast (female), unspecified site    left  . Osteopenia   . Sleep apnea    uses CPAP nightly    Past Surgical History:  Procedure Laterality Date  . BREAST LUMPECTOMY Right 1990's  . BREAST LUMPECTOMY WITH RADIOACTIVE SEED LOCALIZATION Left 12/29/2014   Procedure: LEFT BREAST LUMPECTOMY WITH RADIOACTIVE SEED LOCALIZATION;  Surgeon: Fanny Skates, MD;  Location: [place];  Service: General;  Laterality: Left;  . CATARACT EXTRACTION  07/07/14   left eye  . COMBINED LAPAROSCOPY W/ HYSTEROSCOPY    . MASTECTOMY Right 02/2014  . TOTAL MASTECTOMY Right 02/09/2014   Procedure: RIGHT TOTAL MASTECTOMY;  Surgeon: Adin Hector, MD;  Location: [place];  Service: General;  Laterality: Right;  . TUBAL LIGATION        Family History  Problem Relation Age of Onset  . Hypertension Father   . CVA Father   . Diabetes Mother   . Heart disease Mother   . Diabetes Brother   . CVA Brother   . Breast cancer Paternal Aunt        dx 100s; deceased 15s  . Breast cancer Paternal Aunt        dx late 24s; deceased 23  . Leukemia Cousin        2 paternal female cousins    Social History   Socioeconomic History  . Marital status: Married    Spouse name: Not on file  . Number of children: Not on file  . Years of education: Not on file  . Highest education level: Not on file  Occupational History  . Occupation: retired    Fish farm manager: [employer]    Comment: Professor  Tobacco Use  . Smoking status: Former Smoker    Packs/day: 0.50    Years: 30.00    Pack years: 15.00    Types: Cigarettes    Quit date: 05/06/1991    Years since quitting: 28.2  . Smokeless tobacco: Never Used  Substance and Sexual Activity  . Alcohol use: No  . Drug use: No  . Sexual activity: Not on file  Other Topics Concern  . Not on file  Social History Narrative  . Not  on file   Social Determinants of Health   Financial Resource Strain:   . Difficulty of Paying Living Expenses: Not on file  Food Insecurity:   . Worried About Charity fundraiser in the Last Year: Not on file  . Ran Out of Food in the Last Year: Not on file  Transportation Needs:   . Lack of Transportation (Medical): Not on file  . Lack of Transportation (Non-Medical): Not on file  Physical Activity:   . Days of Exercise per Week: Not on file  . Minutes of Exercise per Session: Not on file  Stress:   . Feeling of Stress : Not on file  Social Connections:   . Frequency of Communication with Friends and Family: Not on file  . Frequency of Social Gatherings with Friends and Family: Not on file  . Attends Religious Services: Not on file  . Active Member of Clubs or Organizations: Not on file  . Attends Archivist Meetings: Not on file  .  Marital Status: Not on file  Intimate Partner Violence:   . Fear of Current or Ex-Partner: Not on file  . Emotionally Abused: Not on file  . Physically Abused: Not on file  . Sexually Abused: Not on file     Physical Exam   Vitals:   08/11/19 1015 08/11/19 1100  BP:  (!) 148/60  Pulse: (!) 54 (!) 53  Resp: 15 12  Temp:    SpO2: 98% 100%    CONSTITUTIONAL: Well-appearing, NAD NEURO:  Alert and oriented x 3, no focal deficits EYES:  eyes equal and reactive ENT/NECK:  no LAD, no JVD CARDIO: Regular rate, well-perfused, normal S1 and S2 PULM:  CTAB no wheezing or rhonchi GI/GU:  normal bowel sounds, non-distended, non-tender MSK/SPINE:  No gross deformities, no edema SKIN: Laceration to the crus of the helix of the right ear PSYCH:  Appropriate speech and behavior  *Additional and/or pertinent findings included in MDM below  Diagnostic and Interventional Summary    EKG Interpretation  Date/Time:  Monday August 11 2019 09:56:05 EST Ventricular Rate:  54 PR Interval:    QRS Duration: 146 QT Interval:  495 QTC Calculation: 470 R Axis:   48 Text Interpretation: Sinus rhythm Right bundle branch block No significant change was found Confirmed by Gerlene Fee [phone]) on 08/11/2019 10:12:18 AM      Cardiac Monitoring Interpretation: Cardiac monitoring was ordered to monitor the patient for dysrhythmia.  I personally interpreted the patient's cardiac monitor while at the bedside. 08/11/2019 12:02 PM Sinus rhythm  Labs Reviewed  COMPREHENSIVE METABOLIC PANEL - Abnormal; Notable for the following components:      Result Value   Glucose, Bld 105 (*)    All other components within normal limits  CBC    CT Head Wo Contrast  Final Result    CT CERVICAL SPINE WO CONTRAST  Final Result      Medications  lidocaine (XYLOCAINE) 2 % (with pres) injection 400 mg (400 mg Other Given by Other 08/11/19 [id number])     Procedures  /  Critical Care .Nerve Block  Date/Time: 08/11/2019  11:57 AM Performed by: Maudie Flakes, MD Authorized by: Maudie Flakes, MD   Consent:    Consent obtained:  Verbal   Consent given by:  Patient and guardian   Risks discussed:  Infection, bleeding, unsuccessful block, swelling and pain Indications:    Indications:  Procedural anesthesia Location:    Nerve block body site: Ear.  Laterality:  Right Pre-procedure details:    Skin preparation:  Alcohol Procedure details (see MAR for exact dosages):    Block needle gauge:  25 G   Anesthetic injected:  Lidocaine 2% w/o epi   Injection procedure:  Anatomic landmarks identified   Paresthesia:  None Post-procedure details:    Dressing:  None   Outcome:  Pain improved   Patient tolerance of procedure:  Tolerated well, no immediate complications Comments:     Auricular nerve block .Marland KitchenLaceration Repair  Date/Time: 08/11/2019 11:58 AM Performed by: Maudie Flakes, MD Authorized by: Maudie Flakes, MD   Consent:    Consent obtained:  Verbal   Consent given by:  Patient and guardian   Risks discussed:  Poor cosmetic result, pain, retained foreign body, poor wound healing and need for additional repair Anesthesia (see MAR for exact dosages):    Anesthesia method:  Local infiltration   Local anesthetic:  Lidocaine 2% w/o epi Laceration details:    Location:  Ear   Ear location:  R ear   Length (cm):  7   Depth (mm):  2 Repair type:    Repair type:  Intermediate Pre-procedure details:    Preparation:  Patient was prepped and draped in usual sterile fashion Exploration:    Hemostasis achieved with:  Direct pressure   Wound exploration: wound explored through full range of motion and entire depth of wound probed and visualized     Wound extent comment:  Minimal cartilage damage to the crus helix   Contaminated: no   Treatment:    Area cleansed with:  Saline   Amount of cleaning:  Standard   Visualized foreign bodies/material removed: no   Skin repair:    Repair method:   Sutures   Suture size:  5-0   Suture material:  Plain gut   Suture technique:  Simple interrupted   Number of sutures:  8 Approximation:    Approximation:  Close Post-procedure details:    Dressing: Pressure dressing.   Patient tolerance of procedure:  Tolerated well, no immediate complications    ED Course and Medical Decision Making  I have reviewed the triage vital signs, the nursing notes, and pertinent available records from the EMR.  Pertinent labs & imaging results that were available during my care of the patient were reviewed by me and considered in my medical decision making (see below for details).     Unwitnessed fall, question mechanical versus syncopal.  Will obtain EKG, labs, CT head and C-spine given confusion, mild neck pain to exclude fracture or subdural hematoma.  Will need suture repair of the ear.  Work-up is unrevealing, normal labs, normal CT, EKG with known right bundle.  Laceration repaired as described above.  Doubt emergent process, favoring mechanical or vasovagal fall.  Options discussed with patient and patient's family, how admission for possible syncope observation is possible but will likely not discover any acute process.  Favoring discharge and close outpatient follow-up.  Barth Kirks. Sedonia Small, [place] [email or web address]  Final Clinical Impressions(s) / ED Diagnoses     ICD-10-CM   1. Fall, initial encounter  W19.XXXA   2. Complex laceration of ear, initial encounter  [id number]     ED Discharge Orders    None       Discharge Instructions Discussed with and Provided to Patient:     Discharge Instructions     You were evaluated in the Emergency Department and after careful  evaluation, we did not find any emergent condition requiring admission or further testing in the hospital.  Your exam/testing today was overall reassuring.  As discussed, please leave the dressing on the ear until  tomorrow morning.  Use Tylenol or Motrin for discomfort.  Please return to the Emergency Department if you experience any worsening of your condition.  We encourage you to follow up with a primary care provider.  Thank you for allowing [place] to be a part of your care.        Maudie Flakes, MD 08/11/19 [phone]

## 2019-08-11 NOTE — ED Notes (Signed)
An After Visit Summary was printed and given to the patient. Discharge instructions given and no further questions at this time. Pt leaving with daughter-in-law. Pt ambulatory at discharge.

## 2019-08-11 NOTE — ED Triage Notes (Signed)
Patient had an unwitnessed fall at home. Patient's daughter-in-law does not know the situation of the fall. Patient has dementia. Patient has a laceration to the right ear. Patient was at [facility], but said they were unable to stitch the area. Bleeding controlled.

## 2019-08-25 ENCOUNTER — Telehealth: Payer: Self-pay

## 2019-08-25 MED ORDER — DONEPEZIL HCL 10 MG PO TABS: 10.0000 mg | ORAL_TABLET | Freq: Every day | ORAL | 2 refills | Status: DC

## 2019-08-25 NOTE — Telephone Encounter (Signed)
Received a fax from [facility] where the patient is requesting a refill on donepezil (ARICEPT) 10 MG tablet.

## 2019-09-30 ENCOUNTER — Telehealth (INDEPENDENT_AMBULATORY_CARE_PROVIDER_SITE_OTHER): Payer: Medicare PPO | Admitting: Family Medicine

## 2019-09-30 ENCOUNTER — Encounter: Payer: Self-pay | Admitting: Family Medicine

## 2019-09-30 DIAGNOSIS — G4733 Obstructive sleep apnea (adult) (pediatric): Secondary | ICD-10-CM | POA: Diagnosis not present

## 2019-09-30 DIAGNOSIS — F0280 Dementia in other diseases classified elsewhere without behavioral disturbance: Secondary | ICD-10-CM | POA: Diagnosis not present

## 2019-09-30 DIAGNOSIS — Z9989 Dependence on other enabling machines and devices: Secondary | ICD-10-CM

## 2019-09-30 DIAGNOSIS — G301 Alzheimer's disease with late onset: Secondary | ICD-10-CM | POA: Diagnosis not present

## 2019-09-30 MED ORDER — MEMANTINE HCL 5 MG PO TABS: 5.0000 mg | ORAL_TABLET | Freq: Two times a day (BID) | ORAL | 3 refills | Status: DC

## 2019-09-30 NOTE — Progress Notes (Addendum)
PATIENT: BENITA WALTERS DOB: 12/28/1940  REASON FOR VISIT: follow up HISTORY FROM: patient  Virtual Visit via Telephone Note  I connected with [name] on 09/30/19 at 11:30 AM EDT by telephone and verified that I am speaking with the correct person using two identifiers.   I discussed the limitations, risks, security and privacy concerns of performing an evaluation and management service by telephone and the availability of in person appointments. I also discussed with the patient that there may be a patient responsible charge related to this service. The patient expressed understanding and agreed to proceed.   History of Present Illness:  09/30/19 SEMEKA ELLESTAD is a 79 y.o. female here today for follow up for dementia and obstructive sleep apnea on CPAP therapy.  Kinsely admits that she continues to have difficulty with compliance.  She does state that she will usually removed CPAP therapy at some point during the night.  She did start Namenda titration pack.  She reports tolerating the medication well.  Her daughter-in-law who aids in history today and agrees that she did well with this medication.  Unfortunately, she has been out of medication for about a month.  Her daughter-in-law notes a decline in her memory over the past 6 months.  They would like to resume Namenda.  She continues Aricept and is tolerating well.  She does not drive.  Her daughter-in-law assists with medications.  She is mostly independent with all other ADLs.  She did have a fall in February.  This fall was not witnessed.  She was evaluated by the ER for a laceration of her ear.  Fortunately no other injuries.  Compliance report reveals suboptimal compliance.  Compliance data from 07/30/2019 through 09/27/2019 reveals that she is used CPAP therapy 16 of the past 60 days for compliance of 27%. She used CPAP greater than 4 hours 10 of the past 60 days.  Residual AHI was 4.1 on 13 cm of water pressure.  Leak remains  elevated in the 95th percentile of 40.1 L/min.   Observations/Objective:  Generalized: Well developed, in no acute distress  Mentation: Alert oriented to time, place, history taking. Follows all commands speech and language fluent   Assessment and Plan:  79 y.o. year old female  has a past medical history of Anxiety, Arthritis, DCIS (ductal carcinoma in situ), Diabetes mellitus without complication ([place]), High cholesterol, HTN (hypertension), Malignant neoplasm of breast (female), unspecified site, Osteopenia, and Sleep apnea. here with    ICD-10-CM   1. OSA on CPAP  G47.33    Z99.89   2. Late onset Alzheimer's disease without behavioral disturbance ([place])  G30.1    F02.80    Hoorain is doing well today.  Unfortunately, she continues to struggle with CPAP compliance.  Compliance report reveals suboptimal compliance.  Progressive dementia likely contributing.  She did tolerate addition of Namenda.  We will continue Aricept as prescribed.  I will restart Namenda 5 mg twice daily.  We will anticipate increasing dose to 10 mg twice daily if well tolerated.  Daughter-in-law is aware of these instructions.  She was encouraged to stay physically active.  Memory compensation strategies reviewed.  Fall precautions advised.  She will follow-up with me in the office in 6 months for a formal memory evaluation and compliance review.  She and her daughter-in-law verbalized understanding and agreement with this plan.  No orders of the defined types were placed in this encounter.   Meds ordered this encounter  Medications  .  memantine (NAMENDA) 5 MG tablet    Sig: Take 1 tablet (5 mg total) by mouth 2 (two) times daily.    Dispense:  180 tablet    Refill:  3    Order Specific Question:   Supervising Provider    Answer:   Melvenia Beam [id number]     Follow Up Instructions:  I discussed the assessment and treatment plan with the patient. The patient was provided an opportunity to ask questions  and all were answered. The patient agreed with the plan and demonstrated an understanding of the instructions.   The patient was advised to call back or seek an in-person evaluation if the symptoms worsen or if the condition fails to improve as anticipated.  I provided 20 minutes of non-face-to-face time during this encounter.  Patient and her daughter-in-law are located at their place of residence during my chart video visit.  Provider is located at her place of residence.   Debbora Presto, NP   I reviewed the above note and documentation by the Nurse Practitioner and agree with the history, exam, assessment and plan as outlined above. I was available for consultation. Star Age, MD, PhD [facility])

## 2019-12-22 ENCOUNTER — Encounter: Payer: Self-pay | Admitting: Family Medicine

## 2019-12-25 ENCOUNTER — Telehealth: Payer: Self-pay | Admitting: Neurology

## 2019-12-25 MED ORDER — MEMANTINE HCL 10 MG PO TABS: 10.0000 mg | ORAL_TABLET | Freq: Two times a day (BID) | ORAL | 3 refills | Status: DC

## 2019-12-25 NOTE — Telephone Encounter (Signed)
Generic namenda Rx sent to pharmacy; 10 mg bid.

## 2020-04-01 NOTE — Progress Notes (Deleted)
PATIENT: Karen [name] DOB: [birth year]  REASON FOR VISIT: follow up HISTORY FROM: patient  No chief complaint on file.    HISTORY OF PRESENT ILLNESS: Today 04/01/20 Karen [name] is a 79 y.o. female here today for follow up for OSA on CPAP and memory loss. She continues Aricept and Namenda. Memory is   Compliance report reveals    HISTORY: (copied from my note on 09/30/2019)  Karen [name] is a 79 y.o. female here today for follow up for dementia and obstructive sleep apnea on CPAP therapy.  Amantha admits that she continues to have difficulty with compliance.  She does state that she will usually removed CPAP therapy at some point during the night.  She did start Namenda titration pack.  She reports tolerating the medication well.  Her daughter-in-law who aids in history today and agrees that she did well with this medication.  Unfortunately, she has been out of medication for about a month.  Her daughter-in-law notes a decline in her memory over the past 6 months.  They would like to resume Namenda.  She continues Aricept and is tolerating well.  She does not drive.  Her daughter-in-law assists with medications.  She is mostly independent with all other ADLs.  She did have a fall in February.  This fall was not witnessed.  She was evaluated by the ER for a laceration of her ear.  Fortunately no other injuries.  Compliance report reveals suboptimal compliance.  Compliance data from 07/30/2019 through 09/27/2019 reveals that she is used CPAP therapy 16 of the past 60 days for compliance of 27%. She used CPAP greater than 4 hours 10 of the past 60 days.  Residual AHI was 4.1 on 13 cm of water pressure.  Leak remains elevated in the 95th percentile of 40.1 L/min.   REVIEW OF SYSTEMS: Out of a complete 14 system review of symptoms, the patient complains only of the following symptoms, memory loss, snoring and all other reviewed systems are negative.  ALLERGIES: No Known Allergies  HOME  MEDICATIONS: Outpatient Medications Prior to Visit  Medication Sig Dispense Refill   amLODipine-valsartan (EXFORGE) 5-160 MG per tablet Take 1 tablet by mouth daily.     donepezil (ARICEPT) 10 MG tablet Take 1 tablet (10 mg total) by mouth at bedtime. 90 tablet 2   memantine (NAMENDA) 10 MG tablet Take 1 tablet (10 mg total) by mouth 2 (two) times daily. 180 tablet 3   metFORMIN (GLUCOPHAGE-XR) 500 MG 24 hr tablet Take 500 mg by mouth every evening.   6   metoprolol tartrate (LOPRESSOR) 25 MG tablet Take 25 mg by mouth 2 (two) times daily.      Multiple Vitamins-Minerals (CENTRUM SILVER ADULT 50+) TABS Take 1 tablet by mouth daily.     pravastatin (PRAVACHOL) 80 MG tablet Take 80 mg by mouth daily.      sertraline (ZOLOFT) 50 MG tablet Take 50 mg by mouth daily.   1   triamcinolone cream (KENALOG) 0.1 % Apply 1 application topically 2 (two) times daily. (Patient not taking: Reported on 08/11/2019) 30 g 0   No facility-administered medications prior to visit.    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, had mastectomy, chemo/radiation   Diabetes mellitus without complication (HCC)    High cholesterol    HTN (hypertension)  Malignant neoplasm of breast (female), unspecified site    left   Osteopenia    Sleep apnea    uses CPAP nightly    PAST SURGICAL HISTORY: Past Surgical History:  Procedure Laterality Date   BREAST LUMPECTOMY Right 1990's   BREAST LUMPECTOMY WITH RADIOACTIVE SEED LOCALIZATION Left 12/29/2014   Procedure: LEFT BREAST LUMPECTOMY WITH RADIOACTIVE SEED LOCALIZATION;  Surgeon: Fanny Skates, MD;  Location: [place];  Service: General;  Laterality: Left;   CATARACT EXTRACTION  07/07/14   left eye   COMBINED LAPAROSCOPY W/ HYSTEROSCOPY     MASTECTOMY Right 02/2014   TOTAL MASTECTOMY Right 02/09/2014   Procedure: RIGHT  TOTAL MASTECTOMY;  Surgeon: Adin Hector, MD;  Location[place];  Service: General;  Laterality: Right;   TUBAL LIGATION      FAMILY HISTORY: Family History  Problem Relation Age of Onset   Hypertension Father    CVA Father    Diabetes Mother    Heart disease Mother    Diabetes Brother    CVA Brother    Breast cancer Paternal Aunt        dx 70s; deceased 71s   Breast cancer Paternal Aunt        dx late 19s; deceased 31   Leukemia Cousin        2 paternal female cousins    SOCIAL HISTORY: Social History   Socioeconomic History   Marital status: Married    Spouse name: Not on file   Number of children: Not on file   Years of education: Not on file   Highest education level: Not on file  Occupational History   Occupation: retired    Fish farm manager: [employer]    Comment: Professor  Tobacco Use   Smoking status: Former Smoker    Packs/day: 0.50    Years: 30.00    Pack years: 15.00    Types: Cigarettes    Quit date: 05/06/1991    Years since quitting: 28.9   Smokeless tobacco: Never Used  Vaping Use   Vaping Use: Never used  Substance and Sexual Activity   Alcohol use: No   Drug use: No   Sexual activity: Not on file  Other Topics Concern   Not on file  Social History Narrative   Not on file   Social Determinants of Health   Financial Resource Strain:    Difficulty of Paying Living Expenses: Not on file  Food Insecurity:    Worried About Charity fundraiser in the Last Year: Not on file   [employer] of Food in the Last Year: Not on file  Transportation Needs:    Lack of Transportation (Medical): Not on file   Lack of Transportation (Non-Medical): Not on file  Physical Activity:    Days of Exercise per Week: Not on file   Minutes of Exercise per Session: Not on file  Stress:    Feeling of Stress : Not on file  Social Connections:    Frequency of Communication with Friends and Family: Not on file   Frequency of Social  Gatherings with Friends and Family: Not on file   Attends Religious Services: Not on file   Active Member of Clubs or Organizations: Not on file   Attends Archivist Meetings: Not on file   Marital Status: Not on file  Intimate Partner Violence:    Fear of Current or Ex-Partner: Not on file   Emotionally Abused: Not on file   Physically  Abused: Not on file   Sexually Abused: Not on file      PHYSICAL EXAM  There were no vitals filed for this visit. There is no height or weight on file to calculate BMI.  Generalized: Well developed, in no acute distress  Cardiology: normal rate and rhythm, no murmur noted Respiratory: clear to auscultation bilaterally  Neurological examination  Mentation: Alert oriented to time, place, history taking. Follows all commands speech and language fluent Cranial nerve II-XII: Pupils were equal round reactive to light. Extraocular movements were full, visual field were full on confrontational test. Facial sensation and strength were normal. Uvula tongue midline. Head turning and shoulder shrug  were normal and symmetric. Motor: The motor testing reveals 5 over 5 strength of all 4 extremities. Good symmetric motor tone is noted throughout.  Sensory: Sensory testing is intact to soft touch on all 4 extremities. No evidence of extinction is noted.  Coordination: Cerebellar testing reveals good finger-nose-finger and heel-to-shin bilaterally.  Gait and station: Gait is normal. Tandem gait is normal. Romberg is negative. No drift is seen.  Reflexes: Deep tendon reflexes are symmetric and normal bilaterally.   DIAGNOSTIC DATA (LABS, IMAGING, TESTING) - I reviewed patient records, labs, notes, testing and imaging myself where available.  MMSE - Mini Mental State Exam 03/31/2019 09/17/2018 09/25/2017  Orientation to time 1 3 2   Orientation to Place 4 4 4   Registration 3 3 3   Attention/ Calculation 4 5 4   Recall 0 0 0  Language- name 2 objects 2  2 2   Language- repeat 0 1 0  Language- follow 3 step command 3 3 3   Language- read & follow direction 1 1 1   Write a sentence 1 1 1   Copy design 1 0 1  Total score 20 23 21      Lab Results  Component Value Date   WBC 5.6 08/11/2019   HGB 12.4 08/11/2019   HCT 38.0 08/11/2019   MCV 91.1 08/11/2019   PLT 163 08/11/2019      Component Value Date/Time   NA 140 08/11/2019 0952   NA 140 12/05/2013 0911   K 3.7 08/11/2019 0952   K 4.4 12/05/2013 0911   CL 102 08/11/2019 0952   CO2 26 08/11/2019 0952   CO2 24 12/05/2013 0911   GLUCOSE 105 (H) 08/11/2019 0952   GLUCOSE 197 (H) 12/05/2013 0911   BUN 21 08/11/2019 0952   BUN 12.3 12/05/2013 0911   CREATININE 0.90 08/11/2019 0952   CREATININE 0.9 12/05/2013 0911   CALCIUM 9.1 08/11/2019 0952   CALCIUM 9.2 12/05/2013 0911   PROT 7.9 08/11/2019 0952   PROT 7.4 12/05/2013 0911   ALBUMIN 4.5 08/11/2019 0952   ALBUMIN 3.8 12/05/2013 0911   AST 31 08/11/2019 0952   AST 33 12/05/2013 0911   ALT 21 08/11/2019 0952   ALT 34 12/05/2013 0911   ALKPHOS 41 08/11/2019 0952   ALKPHOS 45 12/05/2013 0911   BILITOT 0.9 08/11/2019 0952   BILITOT 0.45 12/05/2013 0911   GFRNONAA >60 08/11/2019 0952   GFRAA >60 08/11/2019 0952   No results found for: CHOL, HDL, LDLCALC, LDLDIRECT, TRIG, CHOLHDL No results found for: HGBA1C No results found for: VITAMINB12 No results found for: TSH     ASSESSMENT AND PLAN 79 y.o. year old female  has a past medical history of Anxiety, Arthritis, DCIS (ductal carcinoma in situ), Diabetes mellitus without complication ([place]), High cholesterol, HTN (hypertension), Malignant neoplasm of breast (female), unspecified site, Osteopenia, and Sleep apnea.  here with   No diagnosis found.   Irisa is feeling well, today. Compliance report shows suboptimal compliance, most likely due to cognitive impairment. We will continue Aricept and Namenda. She was encouraged to use CPAP nightly and for greater than 4 hours. I have  encouraged her family to continue to assist with CPAP therapy as well. Healthy lifestyle habits encouraged. Adequate hydration and regular physical/mental activity encouraged. We will follow up in    No orders of the defined types were placed in this encounter.    No orders of the defined types were placed in this encounter.     I spent 15 minutes with the patient. 50% of this time was spent counseling and educating patient on plan of care and medications.    Debbora Presto, FNP-C 04/01/2020, 11:41 AM [facility] [address], [place], [place] [address] [phone]

## 2020-04-05 ENCOUNTER — Ambulatory Visit: Payer: Medicare PPO | Admitting: Family Medicine

## 2020-10-06 DIAGNOSIS — K1370 Unspecified lesions of oral mucosa: Secondary | ICD-10-CM | POA: Diagnosis not present

## 2020-10-06 DIAGNOSIS — K120 Recurrent oral aphthae: Secondary | ICD-10-CM | POA: Diagnosis not present

## 2020-10-11 DIAGNOSIS — E1151 Type 2 diabetes mellitus with diabetic peripheral angiopathy without gangrene: Secondary | ICD-10-CM | POA: Diagnosis not present

## 2020-10-11 DIAGNOSIS — G309 Alzheimer's disease, unspecified: Secondary | ICD-10-CM | POA: Diagnosis not present

## 2020-10-11 DIAGNOSIS — I10 Essential (primary) hypertension: Secondary | ICD-10-CM | POA: Diagnosis not present

## 2020-10-11 DIAGNOSIS — G4733 Obstructive sleep apnea (adult) (pediatric): Secondary | ICD-10-CM | POA: Diagnosis not present

## 2020-10-11 DIAGNOSIS — E785 Hyperlipidemia, unspecified: Secondary | ICD-10-CM | POA: Diagnosis not present

## 2020-10-11 DIAGNOSIS — F419 Anxiety disorder, unspecified: Secondary | ICD-10-CM | POA: Diagnosis not present

## 2020-10-12 DIAGNOSIS — Z0000 Encounter for general adult medical examination without abnormal findings: Secondary | ICD-10-CM | POA: Diagnosis not present

## 2020-10-27 DIAGNOSIS — G309 Alzheimer's disease, unspecified: Secondary | ICD-10-CM | POA: Diagnosis not present

## 2020-10-27 DIAGNOSIS — F419 Anxiety disorder, unspecified: Secondary | ICD-10-CM | POA: Diagnosis not present

## 2020-10-27 DIAGNOSIS — M1991 Primary osteoarthritis, unspecified site: Secondary | ICD-10-CM | POA: Diagnosis not present

## 2020-10-27 DIAGNOSIS — E1151 Type 2 diabetes mellitus with diabetic peripheral angiopathy without gangrene: Secondary | ICD-10-CM | POA: Diagnosis not present

## 2020-10-27 DIAGNOSIS — M6281 Muscle weakness (generalized): Secondary | ICD-10-CM | POA: Diagnosis not present

## 2020-10-27 DIAGNOSIS — F339 Major depressive disorder, recurrent, unspecified: Secondary | ICD-10-CM | POA: Diagnosis not present

## 2020-10-27 DIAGNOSIS — I10 Essential (primary) hypertension: Secondary | ICD-10-CM | POA: Diagnosis not present

## 2020-11-17 DIAGNOSIS — M6281 Muscle weakness (generalized): Secondary | ICD-10-CM | POA: Diagnosis not present

## 2020-11-17 DIAGNOSIS — G309 Alzheimer's disease, unspecified: Secondary | ICD-10-CM | POA: Diagnosis not present

## 2020-11-17 DIAGNOSIS — W19XXXA Unspecified fall, initial encounter: Secondary | ICD-10-CM | POA: Diagnosis not present

## 2020-11-22 DIAGNOSIS — G309 Alzheimer's disease, unspecified: Secondary | ICD-10-CM | POA: Diagnosis not present

## 2020-11-22 DIAGNOSIS — E785 Hyperlipidemia, unspecified: Secondary | ICD-10-CM | POA: Diagnosis not present

## 2020-11-22 DIAGNOSIS — F0280 Dementia in other diseases classified elsewhere without behavioral disturbance: Secondary | ICD-10-CM | POA: Diagnosis not present

## 2020-11-22 DIAGNOSIS — G4733 Obstructive sleep apnea (adult) (pediatric): Secondary | ICD-10-CM | POA: Diagnosis not present

## 2020-11-22 DIAGNOSIS — F419 Anxiety disorder, unspecified: Secondary | ICD-10-CM | POA: Diagnosis not present

## 2020-11-22 DIAGNOSIS — E1151 Type 2 diabetes mellitus with diabetic peripheral angiopathy without gangrene: Secondary | ICD-10-CM | POA: Diagnosis not present

## 2020-11-22 DIAGNOSIS — I10 Essential (primary) hypertension: Secondary | ICD-10-CM | POA: Diagnosis not present

## 2020-11-22 DIAGNOSIS — F5104 Psychophysiologic insomnia: Secondary | ICD-10-CM | POA: Diagnosis not present

## 2020-11-22 DIAGNOSIS — F329 Major depressive disorder, single episode, unspecified: Secondary | ICD-10-CM | POA: Diagnosis not present

## 2020-11-24 DIAGNOSIS — G309 Alzheimer's disease, unspecified: Secondary | ICD-10-CM | POA: Diagnosis not present

## 2020-11-26 DIAGNOSIS — F5104 Psychophysiologic insomnia: Secondary | ICD-10-CM | POA: Diagnosis not present

## 2020-11-26 DIAGNOSIS — F419 Anxiety disorder, unspecified: Secondary | ICD-10-CM | POA: Diagnosis not present

## 2020-11-26 DIAGNOSIS — I10 Essential (primary) hypertension: Secondary | ICD-10-CM | POA: Diagnosis not present

## 2020-11-26 DIAGNOSIS — E1151 Type 2 diabetes mellitus with diabetic peripheral angiopathy without gangrene: Secondary | ICD-10-CM | POA: Diagnosis not present

## 2020-11-26 DIAGNOSIS — G4733 Obstructive sleep apnea (adult) (pediatric): Secondary | ICD-10-CM | POA: Diagnosis not present

## 2020-11-26 DIAGNOSIS — E785 Hyperlipidemia, unspecified: Secondary | ICD-10-CM | POA: Diagnosis not present

## 2020-11-26 DIAGNOSIS — F0280 Dementia in other diseases classified elsewhere without behavioral disturbance: Secondary | ICD-10-CM | POA: Diagnosis not present

## 2020-11-26 DIAGNOSIS — G309 Alzheimer's disease, unspecified: Secondary | ICD-10-CM | POA: Diagnosis not present

## 2020-11-26 DIAGNOSIS — F329 Major depressive disorder, single episode, unspecified: Secondary | ICD-10-CM | POA: Diagnosis not present

## 2020-11-29 DIAGNOSIS — G4733 Obstructive sleep apnea (adult) (pediatric): Secondary | ICD-10-CM | POA: Diagnosis not present

## 2020-11-29 DIAGNOSIS — F329 Major depressive disorder, single episode, unspecified: Secondary | ICD-10-CM | POA: Diagnosis not present

## 2020-11-29 DIAGNOSIS — F0280 Dementia in other diseases classified elsewhere without behavioral disturbance: Secondary | ICD-10-CM | POA: Diagnosis not present

## 2020-11-29 DIAGNOSIS — G309 Alzheimer's disease, unspecified: Secondary | ICD-10-CM | POA: Diagnosis not present

## 2020-11-29 DIAGNOSIS — I10 Essential (primary) hypertension: Secondary | ICD-10-CM | POA: Diagnosis not present

## 2020-11-29 DIAGNOSIS — F5104 Psychophysiologic insomnia: Secondary | ICD-10-CM | POA: Diagnosis not present

## 2020-11-29 DIAGNOSIS — E1151 Type 2 diabetes mellitus with diabetic peripheral angiopathy without gangrene: Secondary | ICD-10-CM | POA: Diagnosis not present

## 2020-11-29 DIAGNOSIS — F419 Anxiety disorder, unspecified: Secondary | ICD-10-CM | POA: Diagnosis not present

## 2020-11-29 DIAGNOSIS — E785 Hyperlipidemia, unspecified: Secondary | ICD-10-CM | POA: Diagnosis not present

## 2020-12-01 DIAGNOSIS — G4733 Obstructive sleep apnea (adult) (pediatric): Secondary | ICD-10-CM | POA: Diagnosis not present

## 2020-12-01 DIAGNOSIS — I10 Essential (primary) hypertension: Secondary | ICD-10-CM | POA: Diagnosis not present

## 2020-12-01 DIAGNOSIS — F5104 Psychophysiologic insomnia: Secondary | ICD-10-CM | POA: Diagnosis not present

## 2020-12-01 DIAGNOSIS — E1151 Type 2 diabetes mellitus with diabetic peripheral angiopathy without gangrene: Secondary | ICD-10-CM | POA: Diagnosis not present

## 2020-12-01 DIAGNOSIS — F329 Major depressive disorder, single episode, unspecified: Secondary | ICD-10-CM | POA: Diagnosis not present

## 2020-12-01 DIAGNOSIS — F0280 Dementia in other diseases classified elsewhere without behavioral disturbance: Secondary | ICD-10-CM | POA: Diagnosis not present

## 2020-12-01 DIAGNOSIS — G309 Alzheimer's disease, unspecified: Secondary | ICD-10-CM | POA: Diagnosis not present

## 2020-12-01 DIAGNOSIS — E785 Hyperlipidemia, unspecified: Secondary | ICD-10-CM | POA: Diagnosis not present

## 2020-12-01 DIAGNOSIS — F419 Anxiety disorder, unspecified: Secondary | ICD-10-CM | POA: Diagnosis not present

## 2020-12-06 DIAGNOSIS — G309 Alzheimer's disease, unspecified: Secondary | ICD-10-CM | POA: Diagnosis not present

## 2020-12-06 DIAGNOSIS — F329 Major depressive disorder, single episode, unspecified: Secondary | ICD-10-CM | POA: Diagnosis not present

## 2020-12-06 DIAGNOSIS — F419 Anxiety disorder, unspecified: Secondary | ICD-10-CM | POA: Diagnosis not present

## 2020-12-06 DIAGNOSIS — E1151 Type 2 diabetes mellitus with diabetic peripheral angiopathy without gangrene: Secondary | ICD-10-CM | POA: Diagnosis not present

## 2020-12-06 DIAGNOSIS — F5104 Psychophysiologic insomnia: Secondary | ICD-10-CM | POA: Diagnosis not present

## 2020-12-06 DIAGNOSIS — G4733 Obstructive sleep apnea (adult) (pediatric): Secondary | ICD-10-CM | POA: Diagnosis not present

## 2020-12-06 DIAGNOSIS — E785 Hyperlipidemia, unspecified: Secondary | ICD-10-CM | POA: Diagnosis not present

## 2020-12-06 DIAGNOSIS — F0280 Dementia in other diseases classified elsewhere without behavioral disturbance: Secondary | ICD-10-CM | POA: Diagnosis not present

## 2020-12-06 DIAGNOSIS — I10 Essential (primary) hypertension: Secondary | ICD-10-CM | POA: Diagnosis not present

## 2020-12-08 DIAGNOSIS — I10 Essential (primary) hypertension: Secondary | ICD-10-CM | POA: Diagnosis not present

## 2020-12-08 DIAGNOSIS — E785 Hyperlipidemia, unspecified: Secondary | ICD-10-CM | POA: Diagnosis not present

## 2020-12-08 DIAGNOSIS — F0280 Dementia in other diseases classified elsewhere without behavioral disturbance: Secondary | ICD-10-CM | POA: Diagnosis not present

## 2020-12-08 DIAGNOSIS — F5104 Psychophysiologic insomnia: Secondary | ICD-10-CM | POA: Diagnosis not present

## 2020-12-08 DIAGNOSIS — G4733 Obstructive sleep apnea (adult) (pediatric): Secondary | ICD-10-CM | POA: Diagnosis not present

## 2020-12-08 DIAGNOSIS — F419 Anxiety disorder, unspecified: Secondary | ICD-10-CM | POA: Diagnosis not present

## 2020-12-08 DIAGNOSIS — F329 Major depressive disorder, single episode, unspecified: Secondary | ICD-10-CM | POA: Diagnosis not present

## 2020-12-08 DIAGNOSIS — G309 Alzheimer's disease, unspecified: Secondary | ICD-10-CM | POA: Diagnosis not present

## 2020-12-08 DIAGNOSIS — E1151 Type 2 diabetes mellitus with diabetic peripheral angiopathy without gangrene: Secondary | ICD-10-CM | POA: Diagnosis not present

## 2020-12-13 DIAGNOSIS — I10 Essential (primary) hypertension: Secondary | ICD-10-CM | POA: Diagnosis not present

## 2020-12-13 DIAGNOSIS — F0280 Dementia in other diseases classified elsewhere without behavioral disturbance: Secondary | ICD-10-CM | POA: Diagnosis not present

## 2020-12-13 DIAGNOSIS — G309 Alzheimer's disease, unspecified: Secondary | ICD-10-CM | POA: Diagnosis not present

## 2020-12-13 DIAGNOSIS — E1151 Type 2 diabetes mellitus with diabetic peripheral angiopathy without gangrene: Secondary | ICD-10-CM | POA: Diagnosis not present

## 2020-12-13 DIAGNOSIS — F419 Anxiety disorder, unspecified: Secondary | ICD-10-CM | POA: Diagnosis not present

## 2020-12-13 DIAGNOSIS — F329 Major depressive disorder, single episode, unspecified: Secondary | ICD-10-CM | POA: Diagnosis not present

## 2020-12-13 DIAGNOSIS — F5104 Psychophysiologic insomnia: Secondary | ICD-10-CM | POA: Diagnosis not present

## 2020-12-13 DIAGNOSIS — E785 Hyperlipidemia, unspecified: Secondary | ICD-10-CM | POA: Diagnosis not present

## 2020-12-13 DIAGNOSIS — G4733 Obstructive sleep apnea (adult) (pediatric): Secondary | ICD-10-CM | POA: Diagnosis not present

## 2020-12-15 DIAGNOSIS — G4733 Obstructive sleep apnea (adult) (pediatric): Secondary | ICD-10-CM | POA: Diagnosis not present

## 2020-12-15 DIAGNOSIS — F0281 Dementia in other diseases classified elsewhere with behavioral disturbance: Secondary | ICD-10-CM | POA: Diagnosis not present

## 2020-12-15 DIAGNOSIS — F0280 Dementia in other diseases classified elsewhere without behavioral disturbance: Secondary | ICD-10-CM | POA: Diagnosis not present

## 2020-12-15 DIAGNOSIS — E785 Hyperlipidemia, unspecified: Secondary | ICD-10-CM | POA: Diagnosis not present

## 2020-12-15 DIAGNOSIS — G309 Alzheimer's disease, unspecified: Secondary | ICD-10-CM | POA: Diagnosis not present

## 2020-12-15 DIAGNOSIS — F329 Major depressive disorder, single episode, unspecified: Secondary | ICD-10-CM | POA: Diagnosis not present

## 2020-12-15 DIAGNOSIS — I10 Essential (primary) hypertension: Secondary | ICD-10-CM | POA: Diagnosis not present

## 2020-12-15 DIAGNOSIS — E1151 Type 2 diabetes mellitus with diabetic peripheral angiopathy without gangrene: Secondary | ICD-10-CM | POA: Diagnosis not present

## 2020-12-15 DIAGNOSIS — F419 Anxiety disorder, unspecified: Secondary | ICD-10-CM | POA: Diagnosis not present

## 2020-12-15 DIAGNOSIS — F5104 Psychophysiologic insomnia: Secondary | ICD-10-CM | POA: Diagnosis not present

## 2020-12-20 DIAGNOSIS — G309 Alzheimer's disease, unspecified: Secondary | ICD-10-CM | POA: Diagnosis not present

## 2020-12-20 DIAGNOSIS — E785 Hyperlipidemia, unspecified: Secondary | ICD-10-CM | POA: Diagnosis not present

## 2020-12-20 DIAGNOSIS — F419 Anxiety disorder, unspecified: Secondary | ICD-10-CM | POA: Diagnosis not present

## 2020-12-20 DIAGNOSIS — I10 Essential (primary) hypertension: Secondary | ICD-10-CM | POA: Diagnosis not present

## 2020-12-20 DIAGNOSIS — F0280 Dementia in other diseases classified elsewhere without behavioral disturbance: Secondary | ICD-10-CM | POA: Diagnosis not present

## 2020-12-20 DIAGNOSIS — F5104 Psychophysiologic insomnia: Secondary | ICD-10-CM | POA: Diagnosis not present

## 2020-12-20 DIAGNOSIS — F329 Major depressive disorder, single episode, unspecified: Secondary | ICD-10-CM | POA: Diagnosis not present

## 2020-12-20 DIAGNOSIS — G4733 Obstructive sleep apnea (adult) (pediatric): Secondary | ICD-10-CM | POA: Diagnosis not present

## 2020-12-20 DIAGNOSIS — E1151 Type 2 diabetes mellitus with diabetic peripheral angiopathy without gangrene: Secondary | ICD-10-CM | POA: Diagnosis not present

## 2020-12-21 DIAGNOSIS — M2042 Other hammer toe(s) (acquired), left foot: Secondary | ICD-10-CM | POA: Diagnosis not present

## 2020-12-21 DIAGNOSIS — B351 Tinea unguium: Secondary | ICD-10-CM | POA: Diagnosis not present

## 2020-12-21 DIAGNOSIS — L603 Nail dystrophy: Secondary | ICD-10-CM | POA: Diagnosis not present

## 2020-12-21 DIAGNOSIS — E1159 Type 2 diabetes mellitus with other circulatory complications: Secondary | ICD-10-CM | POA: Diagnosis not present

## 2020-12-21 DIAGNOSIS — M2041 Other hammer toe(s) (acquired), right foot: Secondary | ICD-10-CM | POA: Diagnosis not present

## 2020-12-22 DIAGNOSIS — E1151 Type 2 diabetes mellitus with diabetic peripheral angiopathy without gangrene: Secondary | ICD-10-CM | POA: Diagnosis not present

## 2020-12-22 DIAGNOSIS — E785 Hyperlipidemia, unspecified: Secondary | ICD-10-CM | POA: Diagnosis not present

## 2020-12-22 DIAGNOSIS — I10 Essential (primary) hypertension: Secondary | ICD-10-CM | POA: Diagnosis not present

## 2020-12-22 DIAGNOSIS — F0280 Dementia in other diseases classified elsewhere without behavioral disturbance: Secondary | ICD-10-CM | POA: Diagnosis not present

## 2020-12-22 DIAGNOSIS — G4733 Obstructive sleep apnea (adult) (pediatric): Secondary | ICD-10-CM | POA: Diagnosis not present

## 2020-12-22 DIAGNOSIS — F329 Major depressive disorder, single episode, unspecified: Secondary | ICD-10-CM | POA: Diagnosis not present

## 2020-12-22 DIAGNOSIS — F419 Anxiety disorder, unspecified: Secondary | ICD-10-CM | POA: Diagnosis not present

## 2020-12-22 DIAGNOSIS — F5104 Psychophysiologic insomnia: Secondary | ICD-10-CM | POA: Diagnosis not present

## 2020-12-22 DIAGNOSIS — G309 Alzheimer's disease, unspecified: Secondary | ICD-10-CM | POA: Diagnosis not present

## 2020-12-24 ENCOUNTER — Other Ambulatory Visit: Payer: Self-pay | Admitting: Neurology

## 2020-12-27 DIAGNOSIS — F419 Anxiety disorder, unspecified: Secondary | ICD-10-CM | POA: Diagnosis not present

## 2020-12-27 DIAGNOSIS — F0280 Dementia in other diseases classified elsewhere without behavioral disturbance: Secondary | ICD-10-CM | POA: Diagnosis not present

## 2020-12-27 DIAGNOSIS — F329 Major depressive disorder, single episode, unspecified: Secondary | ICD-10-CM | POA: Diagnosis not present

## 2020-12-27 DIAGNOSIS — G309 Alzheimer's disease, unspecified: Secondary | ICD-10-CM | POA: Diagnosis not present

## 2020-12-27 DIAGNOSIS — E785 Hyperlipidemia, unspecified: Secondary | ICD-10-CM | POA: Diagnosis not present

## 2020-12-27 DIAGNOSIS — E1151 Type 2 diabetes mellitus with diabetic peripheral angiopathy without gangrene: Secondary | ICD-10-CM | POA: Diagnosis not present

## 2020-12-27 DIAGNOSIS — I10 Essential (primary) hypertension: Secondary | ICD-10-CM | POA: Diagnosis not present

## 2020-12-27 DIAGNOSIS — F5104 Psychophysiologic insomnia: Secondary | ICD-10-CM | POA: Diagnosis not present

## 2020-12-27 DIAGNOSIS — G4733 Obstructive sleep apnea (adult) (pediatric): Secondary | ICD-10-CM | POA: Diagnosis not present

## 2020-12-27 IMAGING — CT CT CERVICAL SPINE W/O CM
4 of 7 series · 14 of 33 positions shown, 15 images · non-contrast
Comparison: None.

CLINICAL DATA: Fall and confusion

EXAM:
CT HEAD WITHOUT CONTRAST
CT CERVICAL SPINE WITHOUT CONTRAST
TECHNIQUE: Multidetector CT imaging of the head and cervical spine was
performed following the standard protocol without intravenous
contrast. Multiplanar CT image reconstructions of the cervical spine
were also generated.

[Series 10: c spine soft · axial · 0.23mm/px · z∈[-232,-142]mm · 4 of 76 slices shown]
[im 16/76  soft-tissue]
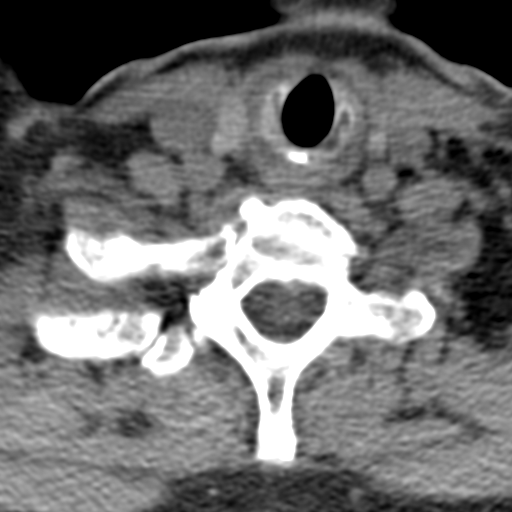
[im 31/76  soft-tissue]
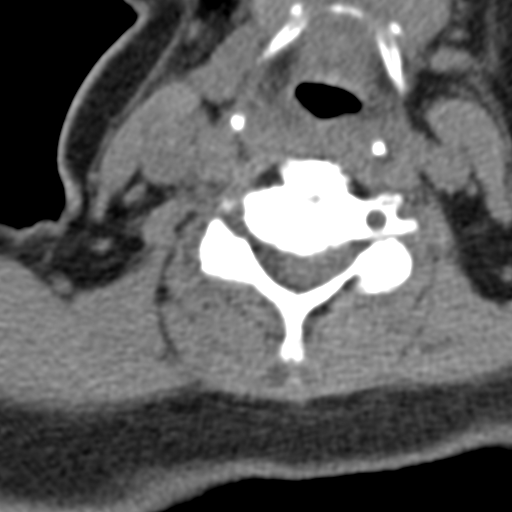
[im 46/76  soft-tissue]
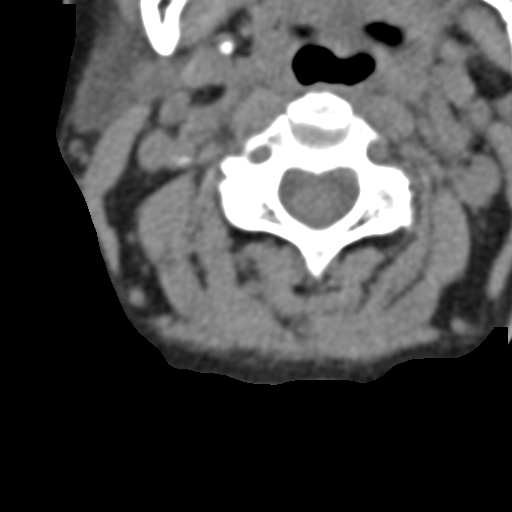
[im 61/76  soft-tissue]
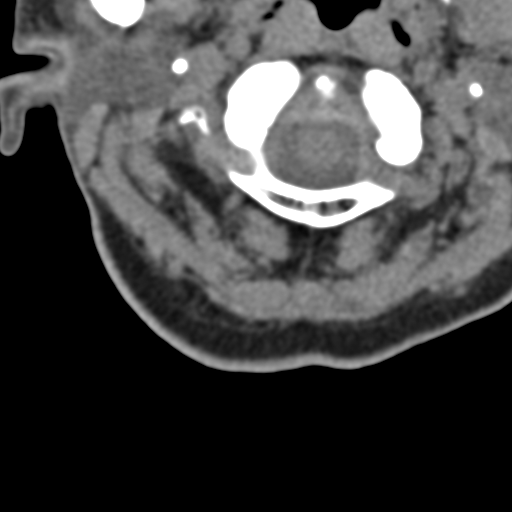

[Series 11: orthogonal bone · axial · 0.23mm/px · z∈[-255,-163]mm · 4 of 83 slices shown, 5 images]
[im 17/83  soft-tissue]
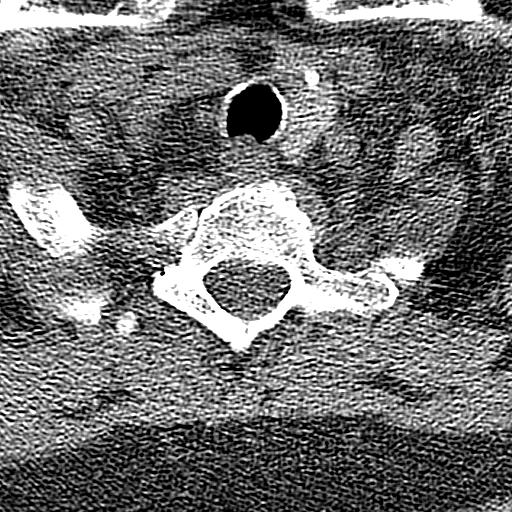
[im 17/83  bone]
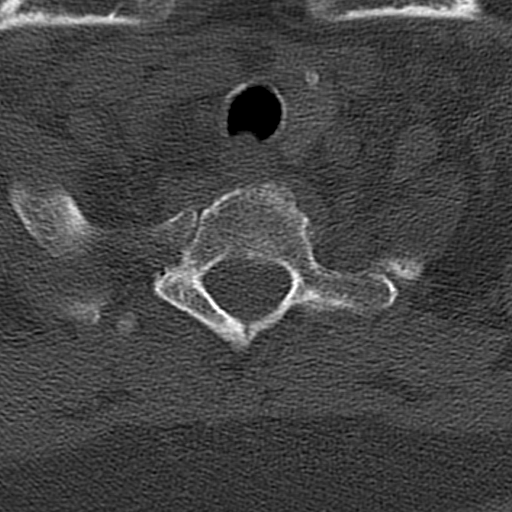
[im 33/83  bone]
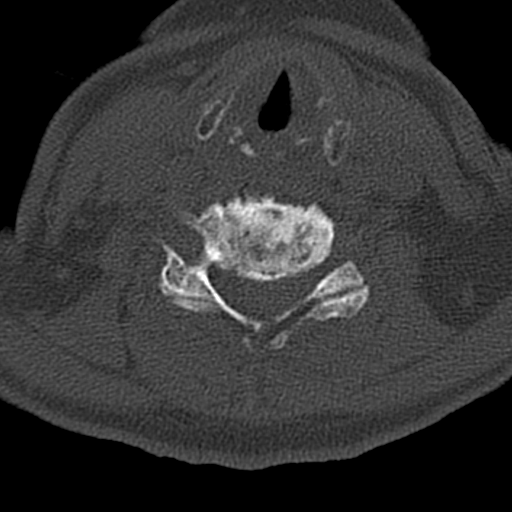
[im 50/83  bone]
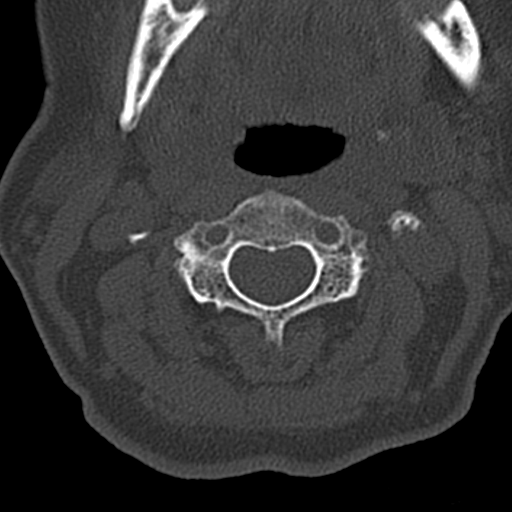
[im 66/83  bone]
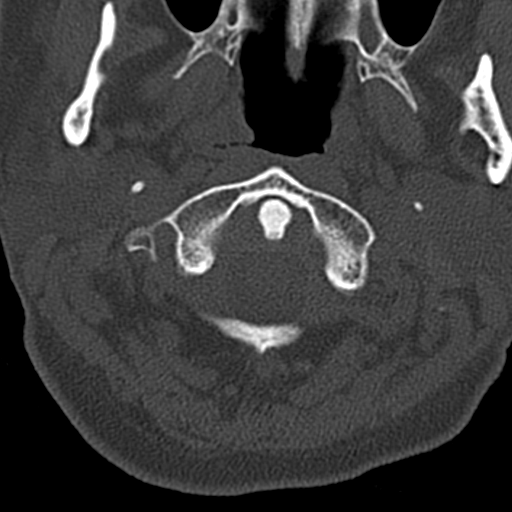

[Series 12: coronal bone · coronal · 0.26mm/px · 1 of 61 slices shown]
[im 31/61  bone]
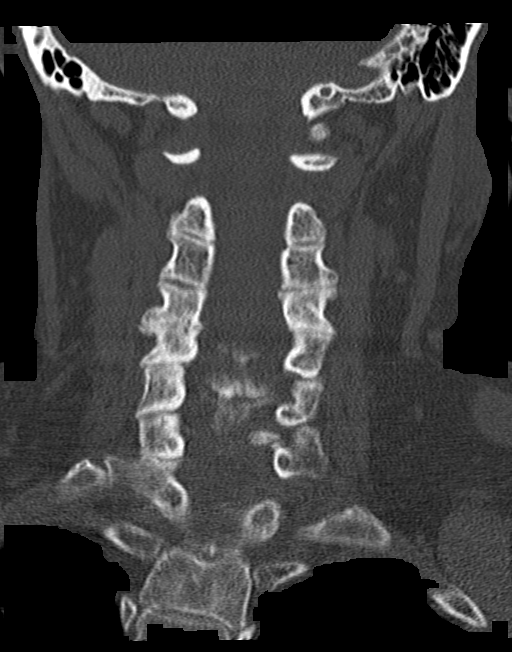

[Series 13: sagittal bone · sagittal · 0.27mm/px · 5 of 61 slices shown]
[im 11/61  bone]
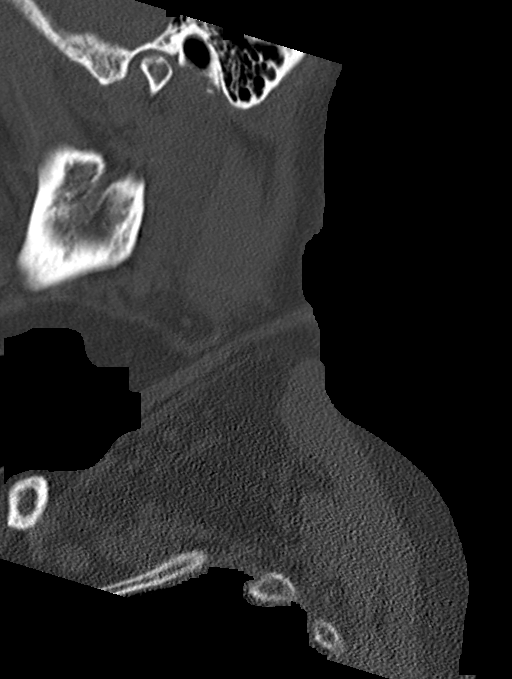
[im 21/61  bone]
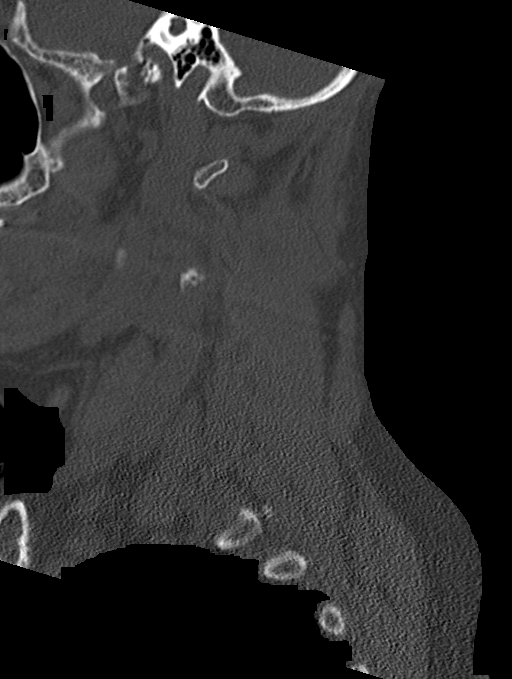
[im 31/61  bone]
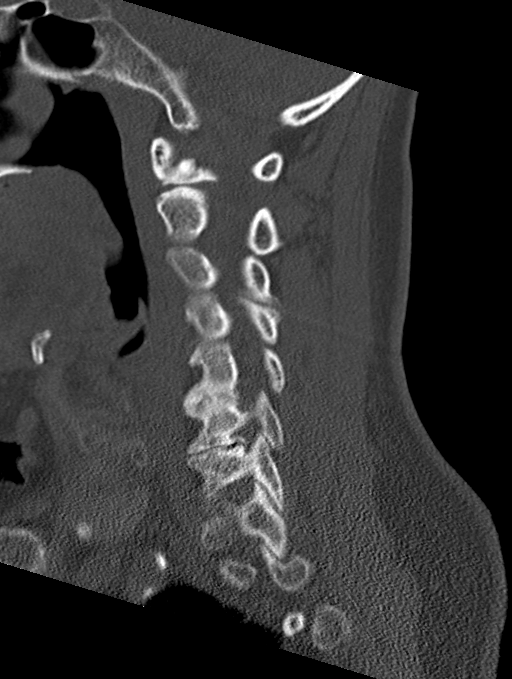
[im 41/61  bone]
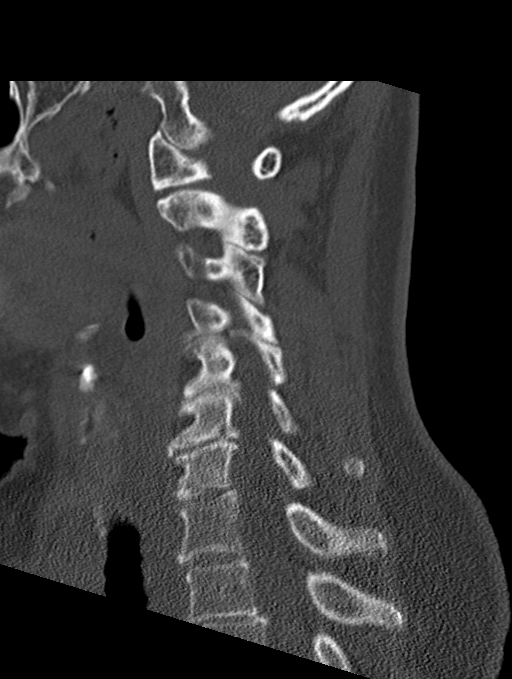
[im 51/61  bone]
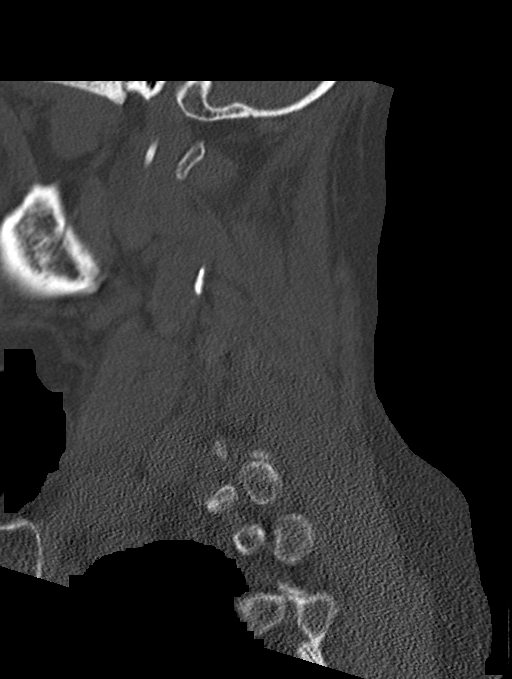

[14 of 33 positions shown; findings below may reference images not displayed]

FINDINGS: CT HEAD FINDINGS

Brain: No evidence of acute infarction, hemorrhage, hydrocephalus,
extra-axial collection or mass lesion/mass effect.

Vascular: Atherosclerotic calcification.

Skull: Negative for fracture. Benign lucency in the upper left
parietal bone, likely venous.

Sinuses/Orbits: Bilateral cataract resection

CT CERVICAL SPINE FINDINGS

Alignment: No traumatic malalignment. There is degenerative reversal
of cervical lordosis.

Skull base and vertebrae: Negative for fracture or evident bone
lesion

Soft tissues and spinal canal: No prevertebral fluid or swelling. No
visible canal hematoma.

Disc levels: Generalized degenerative disc narrowing and ridging
with upper and mid cervical degenerative facet spurring that is
moderate to bulky

Upper chest: Negative
IMPRESSION: No evidence of acute intracranial or cervical spine injury.

## 2020-12-30 DIAGNOSIS — I10 Essential (primary) hypertension: Secondary | ICD-10-CM | POA: Diagnosis not present

## 2020-12-30 DIAGNOSIS — E785 Hyperlipidemia, unspecified: Secondary | ICD-10-CM | POA: Diagnosis not present

## 2020-12-30 DIAGNOSIS — M6281 Muscle weakness (generalized): Secondary | ICD-10-CM | POA: Diagnosis not present

## 2020-12-30 DIAGNOSIS — E119 Type 2 diabetes mellitus without complications: Secondary | ICD-10-CM | POA: Diagnosis not present

## 2020-12-30 DIAGNOSIS — E559 Vitamin D deficiency, unspecified: Secondary | ICD-10-CM | POA: Diagnosis not present

## 2021-01-03 DIAGNOSIS — F329 Major depressive disorder, single episode, unspecified: Secondary | ICD-10-CM | POA: Diagnosis not present

## 2021-01-03 DIAGNOSIS — G4733 Obstructive sleep apnea (adult) (pediatric): Secondary | ICD-10-CM | POA: Diagnosis not present

## 2021-01-03 DIAGNOSIS — F5104 Psychophysiologic insomnia: Secondary | ICD-10-CM | POA: Diagnosis not present

## 2021-01-03 DIAGNOSIS — G309 Alzheimer's disease, unspecified: Secondary | ICD-10-CM | POA: Diagnosis not present

## 2021-01-03 DIAGNOSIS — F0280 Dementia in other diseases classified elsewhere without behavioral disturbance: Secondary | ICD-10-CM | POA: Diagnosis not present

## 2021-01-03 DIAGNOSIS — F419 Anxiety disorder, unspecified: Secondary | ICD-10-CM | POA: Diagnosis not present

## 2021-01-03 DIAGNOSIS — I10 Essential (primary) hypertension: Secondary | ICD-10-CM | POA: Diagnosis not present

## 2021-01-03 DIAGNOSIS — E785 Hyperlipidemia, unspecified: Secondary | ICD-10-CM | POA: Diagnosis not present

## 2021-01-03 DIAGNOSIS — E1151 Type 2 diabetes mellitus with diabetic peripheral angiopathy without gangrene: Secondary | ICD-10-CM | POA: Diagnosis not present

## 2021-01-10 DIAGNOSIS — F419 Anxiety disorder, unspecified: Secondary | ICD-10-CM | POA: Diagnosis not present

## 2021-01-10 DIAGNOSIS — I10 Essential (primary) hypertension: Secondary | ICD-10-CM | POA: Diagnosis not present

## 2021-01-10 DIAGNOSIS — E1151 Type 2 diabetes mellitus with diabetic peripheral angiopathy without gangrene: Secondary | ICD-10-CM | POA: Diagnosis not present

## 2021-01-10 DIAGNOSIS — E785 Hyperlipidemia, unspecified: Secondary | ICD-10-CM | POA: Diagnosis not present

## 2021-01-10 DIAGNOSIS — F5104 Psychophysiologic insomnia: Secondary | ICD-10-CM | POA: Diagnosis not present

## 2021-01-10 DIAGNOSIS — F0280 Dementia in other diseases classified elsewhere without behavioral disturbance: Secondary | ICD-10-CM | POA: Diagnosis not present

## 2021-01-10 DIAGNOSIS — G309 Alzheimer's disease, unspecified: Secondary | ICD-10-CM | POA: Diagnosis not present

## 2021-01-10 DIAGNOSIS — G4733 Obstructive sleep apnea (adult) (pediatric): Secondary | ICD-10-CM | POA: Diagnosis not present

## 2021-01-10 DIAGNOSIS — F329 Major depressive disorder, single episode, unspecified: Secondary | ICD-10-CM | POA: Diagnosis not present

## 2021-01-17 DIAGNOSIS — F0280 Dementia in other diseases classified elsewhere without behavioral disturbance: Secondary | ICD-10-CM | POA: Diagnosis not present

## 2021-01-17 DIAGNOSIS — F419 Anxiety disorder, unspecified: Secondary | ICD-10-CM | POA: Diagnosis not present

## 2021-01-17 DIAGNOSIS — G4733 Obstructive sleep apnea (adult) (pediatric): Secondary | ICD-10-CM | POA: Diagnosis not present

## 2021-01-17 DIAGNOSIS — G309 Alzheimer's disease, unspecified: Secondary | ICD-10-CM | POA: Diagnosis not present

## 2021-01-17 DIAGNOSIS — E1151 Type 2 diabetes mellitus with diabetic peripheral angiopathy without gangrene: Secondary | ICD-10-CM | POA: Diagnosis not present

## 2021-01-17 DIAGNOSIS — F5104 Psychophysiologic insomnia: Secondary | ICD-10-CM | POA: Diagnosis not present

## 2021-01-17 DIAGNOSIS — I10 Essential (primary) hypertension: Secondary | ICD-10-CM | POA: Diagnosis not present

## 2021-01-17 DIAGNOSIS — E785 Hyperlipidemia, unspecified: Secondary | ICD-10-CM | POA: Diagnosis not present

## 2021-01-17 DIAGNOSIS — F329 Major depressive disorder, single episode, unspecified: Secondary | ICD-10-CM | POA: Diagnosis not present

## 2021-01-19 DIAGNOSIS — R634 Abnormal weight loss: Secondary | ICD-10-CM | POA: Diagnosis not present

## 2021-01-19 DIAGNOSIS — F0281 Dementia in other diseases classified elsewhere with behavioral disturbance: Secondary | ICD-10-CM | POA: Diagnosis not present

## 2021-01-19 DIAGNOSIS — F339 Major depressive disorder, recurrent, unspecified: Secondary | ICD-10-CM | POA: Diagnosis not present

## 2021-02-22 DIAGNOSIS — L603 Nail dystrophy: Secondary | ICD-10-CM | POA: Diagnosis not present

## 2021-02-22 DIAGNOSIS — E1159 Type 2 diabetes mellitus with other circulatory complications: Secondary | ICD-10-CM | POA: Diagnosis not present

## 2021-02-22 DIAGNOSIS — B351 Tinea unguium: Secondary | ICD-10-CM | POA: Diagnosis not present

## 2021-02-23 DIAGNOSIS — G309 Alzheimer's disease, unspecified: Secondary | ICD-10-CM | POA: Diagnosis not present

## 2021-02-23 DIAGNOSIS — R634 Abnormal weight loss: Secondary | ICD-10-CM | POA: Diagnosis not present

## 2021-05-18 DIAGNOSIS — E1151 Type 2 diabetes mellitus with diabetic peripheral angiopathy without gangrene: Secondary | ICD-10-CM | POA: Diagnosis not present

## 2021-05-18 DIAGNOSIS — F5104 Psychophysiologic insomnia: Secondary | ICD-10-CM | POA: Diagnosis not present

## 2021-05-18 DIAGNOSIS — G309 Alzheimer's disease, unspecified: Secondary | ICD-10-CM | POA: Diagnosis not present

## 2021-05-18 DIAGNOSIS — F339 Major depressive disorder, recurrent, unspecified: Secondary | ICD-10-CM | POA: Diagnosis not present

## 2021-05-18 DIAGNOSIS — F419 Anxiety disorder, unspecified: Secondary | ICD-10-CM | POA: Diagnosis not present

## 2021-05-18 DIAGNOSIS — M1991 Primary osteoarthritis, unspecified site: Secondary | ICD-10-CM | POA: Diagnosis not present

## 2021-05-18 DIAGNOSIS — I10 Essential (primary) hypertension: Secondary | ICD-10-CM | POA: Diagnosis not present

## 2021-05-18 DIAGNOSIS — E785 Hyperlipidemia, unspecified: Secondary | ICD-10-CM | POA: Diagnosis not present

## 2021-05-18 DIAGNOSIS — E119 Type 2 diabetes mellitus without complications: Secondary | ICD-10-CM | POA: Diagnosis not present

## 2021-05-19 DIAGNOSIS — E559 Vitamin D deficiency, unspecified: Secondary | ICD-10-CM | POA: Diagnosis not present

## 2021-05-19 DIAGNOSIS — D519 Vitamin B12 deficiency anemia, unspecified: Secondary | ICD-10-CM | POA: Diagnosis not present

## 2021-05-19 DIAGNOSIS — E785 Hyperlipidemia, unspecified: Secondary | ICD-10-CM | POA: Diagnosis not present

## 2021-05-19 DIAGNOSIS — E119 Type 2 diabetes mellitus without complications: Secondary | ICD-10-CM | POA: Diagnosis not present

## 2021-05-19 DIAGNOSIS — I10 Essential (primary) hypertension: Secondary | ICD-10-CM | POA: Diagnosis not present

## 2021-05-23 DIAGNOSIS — E1159 Type 2 diabetes mellitus with other circulatory complications: Secondary | ICD-10-CM | POA: Diagnosis not present

## 2021-05-23 DIAGNOSIS — L84 Corns and callosities: Secondary | ICD-10-CM | POA: Diagnosis not present

## 2021-05-23 DIAGNOSIS — B351 Tinea unguium: Secondary | ICD-10-CM | POA: Diagnosis not present

## 2021-05-23 DIAGNOSIS — L603 Nail dystrophy: Secondary | ICD-10-CM | POA: Diagnosis not present

## 2021-05-25 DIAGNOSIS — I10 Essential (primary) hypertension: Secondary | ICD-10-CM | POA: Diagnosis not present

## 2021-05-25 DIAGNOSIS — G309 Alzheimer's disease, unspecified: Secondary | ICD-10-CM | POA: Diagnosis not present

## 2021-05-25 DIAGNOSIS — E785 Hyperlipidemia, unspecified: Secondary | ICD-10-CM | POA: Diagnosis not present

## 2021-05-25 DIAGNOSIS — F5104 Psychophysiologic insomnia: Secondary | ICD-10-CM | POA: Diagnosis not present

## 2021-05-25 DIAGNOSIS — E119 Type 2 diabetes mellitus without complications: Secondary | ICD-10-CM | POA: Diagnosis not present

## 2021-05-25 DIAGNOSIS — F339 Major depressive disorder, recurrent, unspecified: Secondary | ICD-10-CM | POA: Diagnosis not present

## 2021-05-25 DIAGNOSIS — M6281 Muscle weakness (generalized): Secondary | ICD-10-CM | POA: Diagnosis not present

## 2021-05-25 DIAGNOSIS — M1991 Primary osteoarthritis, unspecified site: Secondary | ICD-10-CM | POA: Diagnosis not present

## 2021-05-25 DIAGNOSIS — F419 Anxiety disorder, unspecified: Secondary | ICD-10-CM | POA: Diagnosis not present

## 2021-06-08 DIAGNOSIS — M6281 Muscle weakness (generalized): Secondary | ICD-10-CM | POA: Diagnosis not present

## 2021-06-08 DIAGNOSIS — F419 Anxiety disorder, unspecified: Secondary | ICD-10-CM | POA: Diagnosis not present

## 2021-06-08 DIAGNOSIS — G309 Alzheimer's disease, unspecified: Secondary | ICD-10-CM | POA: Diagnosis not present

## 2021-06-08 DIAGNOSIS — Z0000 Encounter for general adult medical examination without abnormal findings: Secondary | ICD-10-CM | POA: Diagnosis not present

## 2021-06-08 DIAGNOSIS — E785 Hyperlipidemia, unspecified: Secondary | ICD-10-CM | POA: Diagnosis not present

## 2021-06-08 DIAGNOSIS — F5104 Psychophysiologic insomnia: Secondary | ICD-10-CM | POA: Diagnosis not present

## 2021-06-08 DIAGNOSIS — M1991 Primary osteoarthritis, unspecified site: Secondary | ICD-10-CM | POA: Diagnosis not present

## 2021-06-08 DIAGNOSIS — E119 Type 2 diabetes mellitus without complications: Secondary | ICD-10-CM | POA: Diagnosis not present

## 2021-06-08 DIAGNOSIS — I10 Essential (primary) hypertension: Secondary | ICD-10-CM | POA: Diagnosis not present

## 2021-06-15 DIAGNOSIS — I10 Essential (primary) hypertension: Secondary | ICD-10-CM | POA: Diagnosis not present

## 2021-06-15 DIAGNOSIS — M6281 Muscle weakness (generalized): Secondary | ICD-10-CM | POA: Diagnosis not present

## 2021-06-15 DIAGNOSIS — E119 Type 2 diabetes mellitus without complications: Secondary | ICD-10-CM | POA: Diagnosis not present

## 2021-06-15 DIAGNOSIS — M1991 Primary osteoarthritis, unspecified site: Secondary | ICD-10-CM | POA: Diagnosis not present

## 2021-06-15 DIAGNOSIS — G309 Alzheimer's disease, unspecified: Secondary | ICD-10-CM | POA: Diagnosis not present

## 2021-06-15 DIAGNOSIS — F5104 Psychophysiologic insomnia: Secondary | ICD-10-CM | POA: Diagnosis not present

## 2021-06-15 DIAGNOSIS — F339 Major depressive disorder, recurrent, unspecified: Secondary | ICD-10-CM | POA: Diagnosis not present

## 2021-06-15 DIAGNOSIS — E785 Hyperlipidemia, unspecified: Secondary | ICD-10-CM | POA: Diagnosis not present

## 2021-06-15 DIAGNOSIS — F419 Anxiety disorder, unspecified: Secondary | ICD-10-CM | POA: Diagnosis not present

## 2021-06-22 DIAGNOSIS — E119 Type 2 diabetes mellitus without complications: Secondary | ICD-10-CM | POA: Diagnosis not present

## 2021-06-22 DIAGNOSIS — M1991 Primary osteoarthritis, unspecified site: Secondary | ICD-10-CM | POA: Diagnosis not present

## 2021-06-22 DIAGNOSIS — G309 Alzheimer's disease, unspecified: Secondary | ICD-10-CM | POA: Diagnosis not present

## 2021-06-22 DIAGNOSIS — I10 Essential (primary) hypertension: Secondary | ICD-10-CM | POA: Diagnosis not present

## 2021-06-22 DIAGNOSIS — E785 Hyperlipidemia, unspecified: Secondary | ICD-10-CM | POA: Diagnosis not present

## 2021-06-22 DIAGNOSIS — M6281 Muscle weakness (generalized): Secondary | ICD-10-CM | POA: Diagnosis not present

## 2021-06-22 DIAGNOSIS — F419 Anxiety disorder, unspecified: Secondary | ICD-10-CM | POA: Diagnosis not present

## 2021-06-22 DIAGNOSIS — F5104 Psychophysiologic insomnia: Secondary | ICD-10-CM | POA: Diagnosis not present

## 2021-06-22 DIAGNOSIS — F339 Major depressive disorder, recurrent, unspecified: Secondary | ICD-10-CM | POA: Diagnosis not present

## 2022-04-07 ENCOUNTER — Encounter (HOSPITAL_COMMUNITY): Payer: Self-pay

## 2022-04-07 ENCOUNTER — Emergency Department (HOSPITAL_COMMUNITY): Payer: Medicare PPO

## 2022-04-07 ENCOUNTER — Emergency Department (HOSPITAL_COMMUNITY)
Admission: EM | Admit: 2022-04-07 | Discharge: 2022-04-07 | Disposition: A | Discharge status: Home / Self Care | Payer: Medicare PPO | Attending: Emergency Medicine | Admitting: Emergency Medicine

## 2022-04-07 ENCOUNTER — Other Ambulatory Visit: Payer: Self-pay

## 2022-04-07 DIAGNOSIS — S0990XA Unspecified injury of head, initial encounter: Secondary | ICD-10-CM | POA: Diagnosis present

## 2022-04-07 DIAGNOSIS — S0181XA Laceration without foreign body of other part of head, initial encounter: Secondary | ICD-10-CM | POA: Diagnosis not present

## 2022-04-07 DIAGNOSIS — W01198A Fall on same level from slipping, tripping and stumbling with subsequent striking against other object, initial encounter: Secondary | ICD-10-CM | POA: Insufficient documentation

## 2022-04-07 DIAGNOSIS — Z23 Encounter for immunization: Secondary | ICD-10-CM | POA: Insufficient documentation

## 2022-04-07 DIAGNOSIS — Z79899 Other long term (current) drug therapy: Secondary | ICD-10-CM | POA: Diagnosis not present

## 2022-04-07 MED ORDER — TETANUS-DIPHTH-ACELL PERTUSSIS 5-2.5-18.5 LF-MCG/0.5 IM SUSY
0.5000 mL | PREFILLED_SYRINGE | Freq: Once | INTRAMUSCULAR | Status: AC
  Administered 2022-04-07: 0.5 mL via INTRAMUSCULAR
  Filled 2022-04-07: qty 0.5

## 2022-04-07 NOTE — ED Notes (Signed)
Staples in place by provider. No bleeding. No signs of distress.

## 2022-04-07 NOTE — ED Triage Notes (Signed)
Pt arrived via EMS from [facility], found in bed this morning with laceration above right eye, bleeding controlled. No blood thinners. Pt also c/o right shoulder pain, hx of dementia. Unknown if pt fell or hit head off nightstand. Pt able to answer her name, but unable to answer any other orientation questions. Per staff, pt normally ambulatory, talkative, unknown baseline orientation.

## 2022-04-07 NOTE — Discharge Instructions (Signed)
Clean laceration twice a day with soap and water gently.  Staples can come out in a week

## 2022-04-07 NOTE — ED Provider Notes (Signed)
[facility] DEPT Provider Note   CSN: [id number] Arrival date & time: 04/07/22  6314     History  Chief Complaint  Patient presents with   Facial Laceration    Karen [name] is a 81 y.o. female.  Patient has history of dementia.  She fell and hit her head.  Unknown loss of consciousness.  Her mental status is back to her normal  The history is provided by the EMS personnel. No language interpreter was used.  Fall This is a new problem. The current episode started 6 to 12 hours ago. The problem occurs rarely. The problem has been resolved. Pertinent negatives include no chest pain. Nothing aggravates the symptoms. Nothing relieves the symptoms. She has tried nothing for the symptoms. The treatment provided no relief.       Home Medications Prior to Admission medications   Medication Sig Start Date End Date Taking? Authorizing Provider  amLODipine-valsartan (EXFORGE) 5-160 MG per tablet Take 1 tablet by mouth daily.    [provider]  donepezil (ARICEPT) 10 MG tablet Take 1 tablet (10 mg total) by mouth at bedtime. 08/25/19   Lomax, Amy, NP  memantine (NAMENDA) 10 MG tablet TAKE 1 TABLET BY MOUTH TWICE A DAY 12/27/20   Lomax, Amy, NP  metFORMIN (GLUCOPHAGE-XR) 500 MG 24 hr tablet Take 500 mg by mouth every evening.  08/17/14   [provider]  metoprolol tartrate (LOPRESSOR) 25 MG tablet Take 25 mg by mouth 2 (two) times daily.  03/06/13   [provider]  Multiple Vitamins-Minerals (CENTRUM SILVER ADULT 50+) TABS Take 1 tablet by mouth daily.    [provider]  pravastatin (PRAVACHOL) 80 MG tablet Take 80 mg by mouth daily.  09/24/17   [provider]  sertraline (ZOLOFT) 50 MG tablet Take 50 mg by mouth daily.  05/07/15   [provider]  triamcinolone cream (KENALOG) 0.1 % Apply 1 application topically 2 (two) times daily. Patient not taking: Reported on 08/11/2019 06/16/14   Posey Boyer, MD       Allergies    Patient has no known allergies.    Review of Systems   Review of Systems  Unable to perform ROS: Dementia  Cardiovascular:  Negative for chest pain.    Physical Exam Updated Vital Signs BP (!) 171/56   Pulse 61   Temp (!) 97.1 F (36.2 C) (Axillary)   Resp (!) 24   SpO2 100%  Physical Exam Vitals and nursing note reviewed.  Constitutional:      Appearance: She is well-developed.  HENT:     Head: Normocephalic.     Comments: 2 cm laceration above eyebrow on right    Nose: Nose normal.  Eyes:     General: No scleral icterus.    Conjunctiva/sclera: Conjunctivae normal.  Neck:     Thyroid: No thyromegaly.  Cardiovascular:     Rate and Rhythm: Normal rate and regular rhythm.     Heart sounds: No murmur heard.    No friction rub. No gallop.  Pulmonary:     Breath sounds: No stridor. No wheezing or rales.  Chest:     Chest wall: No tenderness.  Abdominal:     General: There is no distension.     Tenderness: There is no abdominal tenderness. There is no rebound.  Musculoskeletal:        General: Normal range of motion.     Cervical back: Neck supple.  Lymphadenopathy:  Cervical: No cervical adenopathy.  Skin:    Findings: No erythema or rash.  Neurological:     Motor: No abnormal muscle tone.     Coordination: Coordination normal.     Comments: Patient with dementia oriented to person only  Psychiatric:        Behavior: Behavior normal.     ED Results / Procedures / Treatments   Labs (all labs ordered are listed, but only abnormal results are displayed) Labs Reviewed - No data to display  EKG EKG Interpretation  Date/Time:  Friday April 07 2022 08:34:06 EDT Ventricular Rate:  53 PR Interval:  169 QRS Duration: 137 QT Interval:  498 QTC Calculation: 468 R Axis:   66 Text Interpretation: Sinus rhythm Right bundle branch block Confirmed by Milton Ferguson [id number]) on 04/07/2022 10:25:50 AM  Radiology CT Head Wo Contrast  Result  Date: 04/07/2022 CLINICAL DATA:  Possible fall, found in bed with laceration above right eye, history of dementia EXAM: CT HEAD WITHOUT CONTRAST CT CERVICAL SPINE WITHOUT CONTRAST TECHNIQUE: Multidetector CT imaging of the head and cervical spine was performed following the standard protocol without intravenous contrast. Multiplanar CT image reconstructions of the cervical spine were also generated. RADIATION DOSE REDUCTION: This exam was performed according to the departmental dose-optimization program which includes automated exposure control, adjustment of the mA and/or kV according to patient size and/or use of iterative reconstruction technique. COMPARISON:  08/11/2019 FINDINGS: CT HEAD FINDINGS Brain: No evidence of acute infarction, hemorrhage, hydrocephalus, extra-axial collection or mass lesion/mass effect. Periventricular and deep white matter hypodensity. Vascular: No hyperdense vessel or unexpected calcification. Skull: Normal. Negative for fracture or focal lesion. Sinuses/Orbits: No acute finding. Other: None. CT CERVICAL SPINE FINDINGS Alignment: Degenerative straightening and reversal of the normal cervical lordosis. Skull base and vertebrae: No acute fracture. No primary bone lesion or focal pathologic process. Soft tissues and spinal canal: No prevertebral fluid or swelling. No visible canal hematoma. Disc levels: Moderate to severe multilevel disc space height loss and osteophytosis, worst from C5-C7. Upper chest: Negative. Other: None. IMPRESSION: 1. No acute intracranial pathology. Small-vessel white matter disease. 2. No fracture or static subluxation of the cervical spine. 3. Moderate to severe multilevel cervical disc degenerative disease. Electronically Signed   By: Delanna Ahmadi M.D.   On: 04/07/2022 09:54   CT Cervical Spine Wo Contrast  Result Date: 04/07/2022 CLINICAL DATA:  Possible fall, found in bed with laceration above right eye, history of dementia EXAM: CT HEAD WITHOUT  CONTRAST CT CERVICAL SPINE WITHOUT CONTRAST TECHNIQUE: Multidetector CT imaging of the head and cervical spine was performed following the standard protocol without intravenous contrast. Multiplanar CT image reconstructions of the cervical spine were also generated. RADIATION DOSE REDUCTION: This exam was performed according to the departmental dose-optimization program which includes automated exposure control, adjustment of the mA and/or kV according to patient size and/or use of iterative reconstruction technique. COMPARISON:  08/11/2019 FINDINGS: CT HEAD FINDINGS Brain: No evidence of acute infarction, hemorrhage, hydrocephalus, extra-axial collection or mass lesion/mass effect. Periventricular and deep white matter hypodensity. Vascular: No hyperdense vessel or unexpected calcification. Skull: Normal. Negative for fracture or focal lesion. Sinuses/Orbits: No acute finding. Other: None. CT CERVICAL SPINE FINDINGS Alignment: Degenerative straightening and reversal of the normal cervical lordosis. Skull base and vertebrae: No acute fracture. No primary bone lesion or focal pathologic process. Soft tissues and spinal canal: No prevertebral fluid or swelling. No visible canal hematoma. Disc levels: Moderate to severe multilevel disc space height loss and osteophytosis,  worst from C5-C7. Upper chest: Negative. Other: None. IMPRESSION: 1. No acute intracranial pathology. Small-vessel white matter disease. 2. No fracture or static subluxation of the cervical spine. 3. Moderate to severe multilevel cervical disc degenerative disease. Electronically Signed   By: Delanna Ahmadi M.D.   On: 04/07/2022 09:54   DG Pelvis Portable  Result Date: 04/07/2022 CLINICAL DATA:  Fall EXAM: PORTABLE PELVIS 1 VIEWS COMPARISON:  None Available. FINDINGS: No evidence of fracture or malalignment. Sclerosis of the right greater than left sacroiliac joint consistent with bilateral sacroiliitis. Atherosclerotic calcifications visualized in  the abdominal aorta. No lytic or blastic osseous lesion. IMPRESSION: 1. No fracture or malalignment. 2. Bilateral sacral ileitis slightly worse on the right than the left. 3. Aortic atherosclerotic calcifications. Electronically Signed   By: Jacqulynn Cadet M.D.   On: 04/07/2022 08:55    Procedures .Marland KitchenLaceration Repair  Date/Time: 04/07/2022 5:28 PM  Performed by: Milton Ferguson, MD Authorized by: Milton Ferguson, MD   Comments:     Patient with a 2 cm laceration to right eyebrow.  Area was cleaned thoroughly with Betadine.  2 staples were used to close the laceration.  The patient tolerated the procedure well     Medications Ordered in ED Medications  Tdap (BOOSTRIX) injection 0.5 mL (has no administration in time range)    ED Course/ Medical Decision Making/ A&P                           Medical Decision Making Amount and/or Complexity of Data Reviewed Radiology: ordered.  Risk Prescription drug management.  This patient presents to the ED for concern of fall, this involves an extensive number of treatment options, and is a complaint that carries with it a high risk of complications and morbidity.  The differential diagnosis includes head injury   Co morbidities that complicate the patient evaluation  Parkinson disease   Additional history obtained:  Additional history obtained from paramedics External records from outside source obtained and reviewed including hospital records   Lab Tests:  No labs  Imaging Studies ordered:  I ordered imaging studies including CT head I independently visualized and interpreted imaging which showed negative I agree with the radiologist interpretation   Cardiac Monitoring: / EKG:  The patient was maintained on a cardiac monitor.  I personally viewed and interpreted the cardiac monitored which showed an underlying rhythm of: Normal sinus rhythm   Consultations Obtained: No consultant  Problem List / ED Course / Critical  interventions / Medication management  Dementia and fall No medicine Reevaluation of the patient after these medicines showed that the patient improved I have reviewed the patients home medicines and have made adjustments as needed   Social Determinants of Health:  At nursing home   Test / Admission - Considered:  Additional test needed Patient with a head injury after fall.  2 cm laceration to right forehead was cleaned and stapled with 2 staples.        Final Clinical Impression(s) / ED Diagnoses Final diagnoses:  Injury of head, initial encounter    Rx / DC Orders ED Discharge Orders     None         Milton Ferguson, MD 04/07/22 1729

## 2023-04-24 ENCOUNTER — Encounter (HOSPITAL_COMMUNITY): Payer: Self-pay | Admitting: Pharmacy Technician

## 2023-04-24 ENCOUNTER — Emergency Department (HOSPITAL_COMMUNITY)
Admission: EM | Admit: 2023-04-24 | Discharge: 2023-04-24 | Disposition: A | Discharge status: Home / Self Care | Payer: Medicare PPO | Attending: Emergency Medicine | Admitting: Emergency Medicine

## 2023-04-24 ENCOUNTER — Other Ambulatory Visit: Payer: Self-pay

## 2023-04-24 ENCOUNTER — Emergency Department (HOSPITAL_COMMUNITY): Payer: Medicare PPO

## 2023-04-24 DIAGNOSIS — W19XXXA Unspecified fall, initial encounter: Secondary | ICD-10-CM | POA: Insufficient documentation

## 2023-04-24 DIAGNOSIS — F0390 Unspecified dementia without behavioral disturbance: Secondary | ICD-10-CM | POA: Insufficient documentation

## 2023-04-24 DIAGNOSIS — Z7984 Long term (current) use of oral hypoglycemic drugs: Secondary | ICD-10-CM | POA: Diagnosis not present

## 2023-04-24 DIAGNOSIS — S0990XA Unspecified injury of head, initial encounter: Secondary | ICD-10-CM | POA: Diagnosis present

## 2023-04-24 DIAGNOSIS — S0003XA Contusion of scalp, initial encounter: Secondary | ICD-10-CM | POA: Insufficient documentation

## 2023-04-24 DIAGNOSIS — E119 Type 2 diabetes mellitus without complications: Secondary | ICD-10-CM | POA: Insufficient documentation

## 2023-04-24 NOTE — ED Triage Notes (Signed)
Pt bib ems from [facility] with a head injury secondary to a fall. Staff states pt is unsteady on her feet at baseline which is what caused her to fall. No LOC per staff. Hematoma noted to posterior head. Altered at baseline. Pt arrives with Ccollar in place.  HR 68 98% RA CBG 104 BP 200/96

## 2023-04-24 NOTE — ED Notes (Signed)
Son at bedside, update given by RN Koffi A.

## 2023-04-24 NOTE — ED Provider Notes (Signed)
[place] EMERGENCY DEPARTMENT AT [facility] Provider Note   CSN: [id number] Arrival date & time: 04/24/23  1809     History  Chief Complaint  Patient presents with   Karen [name] is a 82 y.o. female with past medical history significant for dementia, osteopenia, diabetes presents to the ED from [facility] with head injury secondary to a fall.  Staff state that patient is unsteady on her feet which caused her to fall.  No LOC per staff.  EMS placed patient in cervical collar.  Spoke with son at bedside who confirms that patient is unsteady with walking and has frequent falls.  Patient is currently doing PT to try and help with this.  Patient is unable to answer questions and is confused, however, this is her baseline per son.         Home Medications Prior to Admission medications   Medication Sig Start Date End Date Taking? Authorizing Provider  amLODipine-valsartan (EXFORGE) 5-160 MG per tablet Take 1 tablet by mouth daily.    [provider]  donepezil (ARICEPT) 10 MG tablet Take 1 tablet (10 mg total) by mouth at bedtime. 08/25/19   Lomax, Amy, NP  memantine (NAMENDA) 10 MG tablet TAKE 1 TABLET BY MOUTH TWICE A DAY 12/27/20   Lomax, Amy, NP  metFORMIN (GLUCOPHAGE-XR) 500 MG 24 hr tablet Take 500 mg by mouth every evening.  08/17/14   [provider]  metoprolol tartrate (LOPRESSOR) 25 MG tablet Take 25 mg by mouth 2 (two) times daily.  03/06/13   [provider]  Multiple Vitamins-Minerals (CENTRUM SILVER ADULT 50+) TABS Take 1 tablet by mouth daily.    [provider]  pravastatin (PRAVACHOL) 80 MG tablet Take 80 mg by mouth daily.  09/24/17   [provider]  sertraline (ZOLOFT) 50 MG tablet Take 50 mg by mouth daily.  05/07/15   [provider]  triamcinolone cream (KENALOG) 0.1 % Apply 1 application topically 2 (two) times daily. Patient not taking: Reported on 08/11/2019 06/16/14   Peyton Najjar, MD       Allergies    Patient has no known allergies.    Review of Systems   Review of Systems  Unable to perform ROS: Dementia    Physical Exam Updated Vital Signs BP (!) 154/100   Pulse 63   Temp 98 F (36.7 C)   Resp 18   SpO2 97%  Physical Exam Vitals and nursing note reviewed.  Constitutional:      General: She is not in acute distress.    Appearance: Normal appearance. She is not ill-appearing or diaphoretic.  HENT:     Head: Normocephalic. Mass (left parietal scalp hematoma) present. No abrasion or laceration. Hair is normal.  Eyes:     Pupils: Pupils are equal, round, and reactive to light.  Cardiovascular:     Rate and Rhythm: Normal rate and regular rhythm.  Pulmonary:     Effort: Pulmonary effort is normal.  Musculoskeletal:     Cervical back: Normal range of motion and neck supple. No spinous process tenderness.     Comments: No gross deformities or other obvious injuries on exam.  Skin:    General: Skin is warm and dry.     Capillary Refill: Capillary refill takes less than 2 seconds.  Neurological:     Mental Status: She is alert. Mental status is at baseline. She is confused.     Comments: Patient is  awake and alert, looking around.  Unable to answer questions due to confusion.  This is her baseline.  Psychiatric:        Mood and Affect: Mood normal.        Behavior: Behavior normal.     ED Results / Procedures / Treatments   Labs (all labs ordered are listed, but only abnormal results are displayed) Labs Reviewed - No data to display  EKG None  Radiology CT Head Wo Contrast  Result Date: 04/24/2023 CLINICAL DATA:  Fall, head injury, head and neck pain EXAM: CT HEAD WITHOUT CONTRAST CT CERVICAL SPINE WITHOUT CONTRAST TECHNIQUE: Multidetector CT imaging of the head and cervical spine was performed following the standard protocol without intravenous contrast. Multiplanar CT image reconstructions of the cervical spine were also generated. RADIATION  DOSE REDUCTION: This exam was performed according to the departmental dose-optimization program which includes automated exposure control, adjustment of the mA and/or kV according to patient size and/or use of iterative reconstruction technique. COMPARISON:  04/07/2022 FINDINGS: CT HEAD FINDINGS Brain: No evidence of acute infarct, hemorrhage, mass, mass effect, or midline shift. No hydrocephalus or extra-axial fluid collection. Age related cerebral atrophy. Vascular: No hyperdense vessel. Skull: Negative for fracture or focal lesion. Left parietal scalp hematoma. Sinuses/Orbits: No acute finding. Other: The mastoid air cells are well aerated. CT CERVICAL SPINE FINDINGS Alignment: No traumatic listhesis. Straightening and reversal of the normal cervical lordosis. Skull base and vertebrae: No acute fracture or suspicious osseous lesion. Soft tissues and spinal canal: No prevertebral fluid or swelling. No visible canal hematoma. Disc levels: Degenerative changes in the cervical spine.Moderate spinal canal stenosis at C5-C6. Upper chest: Negative. IMPRESSION: 1. No acute intracranial process. Left parietal scalp hematoma. 2. No acute fracture or traumatic listhesis in the cervical spine. Electronically Signed   By: Wiliam Ke M.D.   On: 04/24/2023 21:37   CT Cervical Spine Wo Contrast  Result Date: 04/24/2023 CLINICAL DATA:  Fall, head injury, head and neck pain EXAM: CT HEAD WITHOUT CONTRAST CT CERVICAL SPINE WITHOUT CONTRAST TECHNIQUE: Multidetector CT imaging of the head and cervical spine was performed following the standard protocol without intravenous contrast. Multiplanar CT image reconstructions of the cervical spine were also generated. RADIATION DOSE REDUCTION: This exam was performed according to the departmental dose-optimization program which includes automated exposure control, adjustment of the mA and/or kV according to patient size and/or use of iterative reconstruction technique. COMPARISON:   04/07/2022 FINDINGS: CT HEAD FINDINGS Brain: No evidence of acute infarct, hemorrhage, mass, mass effect, or midline shift. No hydrocephalus or extra-axial fluid collection. Age related cerebral atrophy. Vascular: No hyperdense vessel. Skull: Negative for fracture or focal lesion. Left parietal scalp hematoma. Sinuses/Orbits: No acute finding. Other: The mastoid air cells are well aerated. CT CERVICAL SPINE FINDINGS Alignment: No traumatic listhesis. Straightening and reversal of the normal cervical lordosis. Skull base and vertebrae: No acute fracture or suspicious osseous lesion. Soft tissues and spinal canal: No prevertebral fluid or swelling. No visible canal hematoma. Disc levels: Degenerative changes in the cervical spine.Moderate spinal canal stenosis at C5-C6. Upper chest: Negative. IMPRESSION: 1. No acute intracranial process. Left parietal scalp hematoma. 2. No acute fracture or traumatic listhesis in the cervical spine. Electronically Signed   By: Wiliam Ke M.D.   On: 04/24/2023 21:37    Procedures Procedures    Medications Ordered in ED Medications - No data to display  ED Course/ Medical Decision Making/ A&P  Medical Decision Making Amount and/or Complexity of Data Reviewed Radiology: ordered.   This patient presents to the ED with chief complaint(s) of head injury with pertinent past medical history of dementia, gait disturbance.  The complaint involves an extensive differential diagnosis and also carries with it a high risk of complications and morbidity.    The differential diagnosis includes acute intracranial injury, hematoma, skull fracture, cervical fracture or subluxation   Initial Assessment:   Exam significant for pleasantly confused patient who is not in acute distress.  Patient is in a cervical collar placed by EMS.  She is awake and looking around.  She is unable to answer questions, however, this is her baseline.  Left parietal  hematoma.  No obvious gross deformities or injuries to the spine.  No other injuries appreciated on exam.   Independent interpretation of imaging: CT head and cervical spine without evidence of acute intracranial injury, skull fracture, cervical fracture or subluxation.  Left parietal scalp hematoma visible on CT.   Treatment and Reassessment: Cervical collar removed after imaging confirmed no fracture.   Disposition:   Patient's imaging is negative for acute findings.  Patient is appropriate for discharge back to facility.  Patient's son at bedside states he will take her POV.  Discussed workup results with son at bedside.  He is in agreement with discharge plan.  Strict return precautions discussed.           Final Clinical Impression(s) / ED Diagnoses Final diagnoses:  Left parietal scalp hematoma, initial encounter  Fall, initial encounter    Rx / DC Orders ED Discharge Orders     None         Lenard Simmer, PA-C 04/24/23 2154    Charlynne Pander, MD 04/24/23 [id number]

## 2023-04-24 NOTE — Discharge Instructions (Addendum)
Thank you for allowing [place] to be a part of her care today.  Her CT scans did no show injury to the brain, skull, or spine.  She has a hematoma on her head that will go down over time.    Return to the ED if you have new concerns.

## 2023-05-16 ENCOUNTER — Other Ambulatory Visit: Payer: Self-pay

## 2023-05-16 ENCOUNTER — Encounter (HOSPITAL_COMMUNITY): Payer: Self-pay

## 2023-05-16 ENCOUNTER — Emergency Department (HOSPITAL_COMMUNITY): Payer: Medicare PPO

## 2023-05-16 ENCOUNTER — Emergency Department (HOSPITAL_COMMUNITY)
Admission: EM | Admit: 2023-05-16 | Discharge: 2023-05-16 | Disposition: A | Discharge status: Home / Self Care | Payer: Medicare PPO | Attending: Emergency Medicine | Admitting: Emergency Medicine

## 2023-05-16 DIAGNOSIS — S0990XA Unspecified injury of head, initial encounter: Secondary | ICD-10-CM | POA: Diagnosis present

## 2023-05-16 DIAGNOSIS — F0390 Unspecified dementia without behavioral disturbance: Secondary | ICD-10-CM | POA: Diagnosis not present

## 2023-05-16 DIAGNOSIS — W01198A Fall on same level from slipping, tripping and stumbling with subsequent striking against other object, initial encounter: Secondary | ICD-10-CM | POA: Diagnosis not present

## 2023-05-16 DIAGNOSIS — W19XXXA Unspecified fall, initial encounter: Secondary | ICD-10-CM

## 2023-05-16 NOTE — ED Triage Notes (Signed)
EMS reports from [facility], called out for witnessed slip and fall from standing while dancing. Head strike, no blood thinners no LOC, no other obvious injury. Hematoma to occipital head. Pt has prior injury right lower right not from fall.  BP 144/70 HR 70 RR 16 Sp02 96 RA CBG 137

## 2023-05-16 NOTE — ED Provider Notes (Signed)
[place] EMERGENCY DEPARTMENT AT [facility] Provider Note   CSN: [id number] Arrival date & time: 05/16/23  1636     History  No chief complaint on file.   Karen [name] is a 82 y.o. female.  82 year old female with prior medical history as detailed below presents for evaluation.  Patient was dancing at her facility.  She slipped and fell.  She did strike her head.  She is at baseline.  She has minimal communication given history of profound dementia.  The history is provided by the patient and medical records.       Home Medications Prior to Admission medications   Medication Sig Start Date End Date Taking? Authorizing Provider  amLODipine-valsartan (EXFORGE) 5-160 MG per tablet Take 1 tablet by mouth daily.    [provider]  donepezil (ARICEPT) 10 MG tablet Take 1 tablet (10 mg total) by mouth at bedtime. 08/25/19   Lomax, Amy, NP  memantine (NAMENDA) 10 MG tablet TAKE 1 TABLET BY MOUTH TWICE A DAY 12/27/20   Lomax, Amy, NP  metFORMIN (GLUCOPHAGE-XR) 500 MG 24 hr tablet Take 500 mg by mouth every evening.  08/17/14   [provider]  metoprolol tartrate (LOPRESSOR) 25 MG tablet Take 25 mg by mouth 2 (two) times daily.  03/06/13   [provider]  Multiple Vitamins-Minerals (CENTRUM SILVER ADULT 50+) TABS Take 1 tablet by mouth daily.    [provider]  pravastatin (PRAVACHOL) 80 MG tablet Take 80 mg by mouth daily.  09/24/17   [provider]  sertraline (ZOLOFT) 50 MG tablet Take 50 mg by mouth daily.  05/07/15   [provider]  triamcinolone cream (KENALOG) 0.1 % Apply 1 application topically 2 (two) times daily. Patient not taking: Reported on 08/11/2019 06/16/14   Peyton Najjar, MD      Allergies    Patient has no known allergies.    Review of Systems   Review of Systems  All other systems reviewed and are negative.   Physical Exam Updated Vital Signs There were no vitals taken for this  visit. Physical Exam Vitals and nursing note reviewed.  Constitutional:      General: She is not in acute distress.    Appearance: Normal appearance. She is well-developed.  HENT:     Head: Normocephalic and atraumatic.  Eyes:     Conjunctiva/sclera: Conjunctivae normal.     Pupils: Pupils are equal, round, and reactive to light.  Cardiovascular:     Rate and Rhythm: Normal rate and regular rhythm.     Heart sounds: Normal heart sounds.  Pulmonary:     Effort: Pulmonary effort is normal. No respiratory distress.     Breath sounds: Normal breath sounds.  Abdominal:     General: There is no distension.     Palpations: Abdomen is soft.     Tenderness: There is no abdominal tenderness.  Musculoskeletal:        General: No deformity. Normal range of motion.     Cervical back: Normal range of motion and neck supple.  Skin:    General: Skin is warm and dry.  Neurological:     General: No focal deficit present.     Mental Status: She is alert and oriented to person, place, and time.     ED Results / Procedures / Treatments   Labs (all labs ordered are listed, but only abnormal results are displayed) Labs Reviewed - No data to display  EKG None  Radiology No results found.  Procedures Procedures    Medications Ordered in ED Medications - No data to display  ED Course/ Medical Decision Making/ A&P                                 Medical Decision Making Amount and/or Complexity of Data Reviewed Radiology: ordered.    Medical Screen Complete  This patient presented to the ED with complaint of fall, head injury.  This complaint involves an extensive number of treatment options. The initial differential diagnosis includes, but is not limited to, trauma from fall  This presentation is: Acute, Self-Limited, Previously Undiagnosed, Uncertain Prognosis, Complicated, Systemic Symptoms, and Threat to Life/Bodily Function  Patient presents after fall.  Patient without  evidence of significant traumatic injury on exam.  Obtained imaging is without acute abnormality.  Patient appears to be comfortable.  Patient's family at bedside reports that she is at her baseline.  Family is comfortable with plan for charge.  Importance of close follow-up is stressed.  Strict return precautions given and understood.  Additional history obtained:  External records from outside sources obtained and reviewed including prior ED visits and prior Inpatient records.    Imaging Studies ordered:  I ordered imaging studies including CT Head, CT Cspine, CXR Pelvis XR  I independently visualized and interpreted obtained imaging which showed NAD I agree with the radiologist interpretation.  Problem List / ED Course:  Fall, head injury   Reevaluation:  After the interventions noted above, I reevaluated the patient and found that they have: improved   Disposition:  After consideration of the diagnostic results and the patients response to treatment, I feel that the patent would benefit from close outpatient followup.          Final Clinical Impression(s) / ED Diagnoses Final diagnoses:  Fall, initial encounter  Injury of head, initial encounter    Rx / DC Orders ED Discharge Orders     None         Wynetta Fines, MD 05/16/23 2000

## 2023-05-16 NOTE — ED Notes (Signed)
Pt is returning to facility with son

## 2023-05-16 NOTE — Discharge Instructions (Addendum)
Return for any problem.  ?

## 2023-06-12 ENCOUNTER — Emergency Department (HOSPITAL_COMMUNITY): Payer: Medicare PPO

## 2023-06-12 ENCOUNTER — Other Ambulatory Visit: Payer: Self-pay

## 2023-06-12 ENCOUNTER — Inpatient Hospital Stay (HOSPITAL_COMMUNITY)
Admission: EM | Admit: 2023-06-12 | Discharge: 2023-06-16 | DRG: 963 | Disposition: A | Discharge status: Skilled Nursing Facility | Payer: Medicare PPO | Source: Skilled Nursing Facility | Attending: Internal Medicine | Admitting: Internal Medicine

## 2023-06-12 ENCOUNTER — Encounter (HOSPITAL_COMMUNITY): Payer: Self-pay

## 2023-06-12 DIAGNOSIS — Z681 Body mass index (BMI) 19 or less, adult: Secondary | ICD-10-CM | POA: Diagnosis not present

## 2023-06-12 DIAGNOSIS — E785 Hyperlipidemia, unspecified: Secondary | ICD-10-CM | POA: Diagnosis present

## 2023-06-12 DIAGNOSIS — Z79899 Other long term (current) drug therapy: Secondary | ICD-10-CM

## 2023-06-12 DIAGNOSIS — Z66 Do not resuscitate: Secondary | ICD-10-CM | POA: Diagnosis present

## 2023-06-12 DIAGNOSIS — Z515 Encounter for palliative care: Secondary | ICD-10-CM

## 2023-06-12 DIAGNOSIS — Z86000 Personal history of in-situ neoplasm of breast: Secondary | ICD-10-CM

## 2023-06-12 DIAGNOSIS — E7800 Pure hypercholesterolemia, unspecified: Secondary | ICD-10-CM | POA: Diagnosis not present

## 2023-06-12 DIAGNOSIS — Z853 Personal history of malignant neoplasm of breast: Secondary | ICD-10-CM | POA: Diagnosis not present

## 2023-06-12 DIAGNOSIS — S32592A Other specified fracture of left pubis, initial encounter for closed fracture: Secondary | ICD-10-CM | POA: Diagnosis present

## 2023-06-12 DIAGNOSIS — R636 Underweight: Secondary | ICD-10-CM | POA: Diagnosis not present

## 2023-06-12 DIAGNOSIS — G9341 Metabolic encephalopathy: Secondary | ICD-10-CM | POA: Diagnosis present

## 2023-06-12 DIAGNOSIS — Z923 Personal history of irradiation: Secondary | ICD-10-CM

## 2023-06-12 DIAGNOSIS — F0284 Dementia in other diseases classified elsewhere, unspecified severity, with anxiety: Secondary | ICD-10-CM | POA: Diagnosis present

## 2023-06-12 DIAGNOSIS — Z833 Family history of diabetes mellitus: Secondary | ICD-10-CM

## 2023-06-12 DIAGNOSIS — D0511 Intraductal carcinoma in situ of right breast: Secondary | ICD-10-CM | POA: Diagnosis present

## 2023-06-12 DIAGNOSIS — F0390 Unspecified dementia without behavioral disturbance: Secondary | ICD-10-CM | POA: Diagnosis present

## 2023-06-12 DIAGNOSIS — Z634 Disappearance and death of family member: Secondary | ICD-10-CM | POA: Diagnosis not present

## 2023-06-12 DIAGNOSIS — S066XAA Traumatic subarachnoid hemorrhage with loss of consciousness status unknown, initial encounter: Secondary | ICD-10-CM | POA: Diagnosis not present

## 2023-06-12 DIAGNOSIS — Z7984 Long term (current) use of oral hypoglycemic drugs: Secondary | ICD-10-CM

## 2023-06-12 DIAGNOSIS — Z9842 Cataract extraction status, left eye: Secondary | ICD-10-CM

## 2023-06-12 DIAGNOSIS — S065XAA Traumatic subdural hemorrhage with loss of consciousness status unknown, initial encounter: Principal | ICD-10-CM | POA: Diagnosis present

## 2023-06-12 DIAGNOSIS — G4733 Obstructive sleep apnea (adult) (pediatric): Secondary | ICD-10-CM | POA: Diagnosis not present

## 2023-06-12 DIAGNOSIS — G309 Alzheimer's disease, unspecified: Secondary | ICD-10-CM | POA: Diagnosis present

## 2023-06-12 DIAGNOSIS — Z9011 Acquired absence of right breast and nipple: Secondary | ICD-10-CM

## 2023-06-12 DIAGNOSIS — M8580 Other specified disorders of bone density and structure, unspecified site: Secondary | ICD-10-CM | POA: Diagnosis not present

## 2023-06-12 DIAGNOSIS — Y92129 Unspecified place in nursing home as the place of occurrence of the external cause: Secondary | ICD-10-CM

## 2023-06-12 DIAGNOSIS — I10 Essential (primary) hypertension: Secondary | ICD-10-CM | POA: Diagnosis not present

## 2023-06-12 DIAGNOSIS — Z8249 Family history of ischemic heart disease and other diseases of the circulatory system: Secondary | ICD-10-CM | POA: Diagnosis not present

## 2023-06-12 DIAGNOSIS — Z87891 Personal history of nicotine dependence: Secondary | ICD-10-CM | POA: Diagnosis not present

## 2023-06-12 DIAGNOSIS — E119 Type 2 diabetes mellitus without complications: Secondary | ICD-10-CM

## 2023-06-12 DIAGNOSIS — W19XXXA Unspecified fall, initial encounter: Secondary | ICD-10-CM | POA: Diagnosis not present

## 2023-06-12 DIAGNOSIS — R296 Repeated falls: Secondary | ICD-10-CM | POA: Diagnosis present

## 2023-06-12 DIAGNOSIS — Z803 Family history of malignant neoplasm of breast: Secondary | ICD-10-CM

## 2023-06-12 DIAGNOSIS — Z9851 Tubal ligation status: Secondary | ICD-10-CM

## 2023-06-12 DIAGNOSIS — Z9221 Personal history of antineoplastic chemotherapy: Secondary | ICD-10-CM

## 2023-06-12 LAB — CBC WITH DIFFERENTIAL/PLATELET
Abs Immature Granulocytes: 0.03 10*3/uL (ref 0.00–0.07)
Basophils Absolute: 0 10*3/uL (ref 0.0–0.1)
Basophils Relative: 1 %
Eosinophils Absolute: 0.1 10*3/uL (ref 0.0–0.5)
Eosinophils Relative: 1 %
HCT: 36.5 % (ref 36.0–46.0)
Hemoglobin: 11.7 g/dL — ABNORMAL LOW (ref 12.0–15.0)
Immature Granulocytes: 1 %
Lymphocytes Relative: 15 %
Lymphs Abs: 0.9 10*3/uL (ref 0.7–4.0)
MCH: 30.2 pg (ref 26.0–34.0)
MCHC: 32.1 g/dL (ref 30.0–36.0)
MCV: 94.3 fL (ref 80.0–100.0)
Monocytes Absolute: 0.5 10*3/uL (ref 0.1–1.0)
Monocytes Relative: 8 %
Neutro Abs: 4.3 10*3/uL (ref 1.7–7.7)
Neutrophils Relative %: 74 %
Platelets: 132 10*3/uL — ABNORMAL LOW (ref 150–400)
RBC: 3.87 MIL/uL (ref 3.87–5.11)
RDW: 13 % (ref 11.5–15.5)
WBC: 5.8 10*3/uL (ref 4.0–10.5)
nRBC: 0 % (ref 0.0–0.2)

## 2023-06-12 LAB — URINALYSIS, W/ REFLEX TO CULTURE (INFECTION SUSPECTED)
Bacteria, UA: NONE SEEN
Bilirubin Urine: NEGATIVE
Glucose, UA: NEGATIVE mg/dL
Hgb urine dipstick: NEGATIVE
Ketones, ur: NEGATIVE mg/dL
Leukocytes,Ua: NEGATIVE
Nitrite: NEGATIVE
Protein, ur: 30 mg/dL — AB
Specific Gravity, Urine: 1.023 (ref 1.005–1.030)
pH: 6 (ref 5.0–8.0)

## 2023-06-12 LAB — COMPREHENSIVE METABOLIC PANEL
ALT: 19 U/L (ref 0–44)
AST: 37 U/L (ref 15–41)
Albumin: 4.2 g/dL (ref 3.5–5.0)
Alkaline Phosphatase: 52 U/L (ref 38–126)
Anion gap: 7 (ref 5–15)
BUN: 57 mg/dL — ABNORMAL HIGH (ref 8–23)
CO2: 28 mmol/L (ref 22–32)
Calcium: 9.1 mg/dL (ref 8.9–10.3)
Chloride: 106 mmol/L (ref 98–111)
Creatinine, Ser: 1.07 mg/dL — ABNORMAL HIGH (ref 0.44–1.00)
GFR, Estimated: 52 mL/min — ABNORMAL LOW (ref >60)
Glucose, Bld: 111 mg/dL — ABNORMAL HIGH (ref 70–99)
Potassium: 4 mmol/L (ref 3.5–5.1)
Sodium: 141 mmol/L (ref 135–145)
Total Bilirubin: 0.5 mg/dL (ref <1.2)
Total Protein: 7.6 g/dL (ref 6.5–8.1)

## 2023-06-12 MED ORDER — IOHEXOL 300 MG/ML  SOLN
100.0000 mL | Freq: Once | INTRAMUSCULAR | Status: AC | PRN
  Administered 2023-06-12: 100 mL via INTRAVENOUS

## 2023-06-12 MED ORDER — LEVETIRACETAM IN NACL 1500 MG/100ML IV SOLN
1500.0000 mg | Freq: Once | INTRAVENOUS | Status: AC
  Administered 2023-06-12: 1500 mg via INTRAVENOUS
  Filled 2023-06-12: qty 100

## 2023-06-12 NOTE — ED Notes (Signed)
[facility] called for pt transport  

## 2023-06-12 NOTE — ED Provider Notes (Signed)
[place] EMERGENCY DEPARTMENT AT [facility] Provider Note   CSN: [id number] Arrival date & time: 06/12/23  1621     History  Chief Complaint  Patient presents with   Karen [name] KEYANNAH BOODOO is a 82 y.o. female.  Patient is an 82 year old female with a past medical history of dementia ANO x 0 at baseline presenting to the emergency department after a fall.  Per EMS, the patient had an unwitnessed fall at the nursing facility today.  She was placed in a c-collar and route.  Patient is here with her son who states that he did receive a call on Saturday that she had a fever and was given Tylenol but had no return of her fever since then.  He states that they did report that she had diarrhea on Saturday as well but no other calls about any issues the last couple of days.  He states that she does not always tell him when she is in any pain.  He states that she was post to walk with a walker at baseline but usually refuses which she thinks causes her frequent falls.  He denies any blood thinner use.  The history is provided by a relative and the EMS personnel. The history is limited by the condition of the patient (Level 5 caveat for dementia).  Fall       Home Medications Prior to Admission medications   Medication Sig Start Date End Date Taking? Authorizing Provider  amLODipine-valsartan (EXFORGE) 5-160 MG per tablet Take 1 tablet by mouth daily.    [provider]  donepezil (ARICEPT) 10 MG tablet Take 1 tablet (10 mg total) by mouth at bedtime. 08/25/19   Lomax, Amy, NP  memantine (NAMENDA) 10 MG tablet TAKE 1 TABLET BY MOUTH TWICE A DAY 12/27/20   Lomax, Amy, NP  metFORMIN (GLUCOPHAGE-XR) 500 MG 24 hr tablet Take 500 mg by mouth every evening.  08/17/14   [provider]  metoprolol tartrate (LOPRESSOR) 25 MG tablet Take 25 mg by mouth 2 (two) times daily.  03/06/13   [provider]  Multiple Vitamins-Minerals (CENTRUM SILVER ADULT 50+) TABS Take 1  tablet by mouth daily.    [provider]  pravastatin (PRAVACHOL) 80 MG tablet Take 80 mg by mouth daily.  09/24/17   [provider]  sertraline (ZOLOFT) 50 MG tablet Take 50 mg by mouth daily.  05/07/15   [provider]  triamcinolone cream (KENALOG) 0.1 % Apply 1 application topically 2 (two) times daily. Patient not taking: Reported on 08/11/2019 06/16/14   Peyton Najjar, MD      Allergies    Patient has no known allergies.    Review of Systems   Review of Systems  Physical Exam Updated Vital Signs BP (!) 141/127   Pulse 62   Temp 98.4 F (36.9 C) (Oral)   Resp 10   SpO2 99%  Physical Exam Vitals and nursing note reviewed.  Constitutional:      General: She is not in acute distress.    Appearance: Normal appearance.  HENT:     Head: Normocephalic and atraumatic.     Nose: Nose normal.     Mouth/Throat:     Mouth: Mucous membranes are moist.  Eyes:     Extraocular Movements: Extraocular movements intact.     Conjunctiva/sclera: Conjunctivae normal.  Neck:     Comments: No midline neck tenderness, c-collar in place Cardiovascular:     Rate  and Rhythm: Normal rate and regular rhythm.     Heart sounds: Normal heart sounds.  Pulmonary:     Effort: Pulmonary effort is normal.     Breath sounds: Normal breath sounds.  Abdominal:     General: Abdomen is flat.     Palpations: Abdomen is soft.     Tenderness: There is no abdominal tenderness.  Musculoskeletal:        General: Normal range of motion.     Comments: No midline back tenderness No bony tenderness to bilateral upper or lower extremities  Skin:    General: Skin is warm and dry.  Neurological:     General: No focal deficit present.     Mental Status: She is alert. Mental status is at baseline.     Comments: Oriented x 0, moving all 4 extremities spontaneously, not following commands  Psychiatric:        Mood and Affect: Mood normal.     ED Results / Procedures / Treatments    Labs (all labs ordered are listed, but only abnormal results are displayed) Labs Reviewed  COMPREHENSIVE METABOLIC PANEL - Abnormal; Notable for the following components:      Result Value   Glucose, Bld 111 (*)    BUN 57 (*)    Creatinine, Ser 1.07 (*)    GFR, Estimated 52 (*)    All other components within normal limits  CBC WITH DIFFERENTIAL/PLATELET - Abnormal; Notable for the following components:   Hemoglobin 11.7 (*)    Platelets 132 (*)    All other components within normal limits  URINALYSIS, W/ REFLEX TO CULTURE (INFECTION SUSPECTED) - Abnormal; Notable for the following components:   Protein, ur 30 (*)    All other components within normal limits    EKG EKG Interpretation Date/Time:  Tuesday June 12 2023 18:45:28 EST Ventricular Rate:  63 PR Interval:    QRS Duration:  137 QT Interval:  524 QTC Calculation: 541 R Axis:   80  Text Interpretation: AV block, complete (third degree) IVCD, consider atypical RBBB Minimal ST elevation, lateral leads Baseline wander in lead(s) I Interpretation limited secondary to artifact Otherwise no significant change Confirmed by Elayne Snare ([id number]) on 06/12/2023 6:59:06 PM  Radiology DG Hip Unilat With Pelvis 2-3 Views Left  Result Date: 06/12/2023 CLINICAL DATA:  Status post fall. Rotated appearance of the left proximal femur on pelvis radiographs EXAM: DG HIP (WITH OR WITHOUT PELVIS) 3V LEFT COMPARISON:  Same-day pelvis radiograph FINDINGS: Again seen is displaced fracture of the parasymphyseal left pubic ramus. Questionable nondisplaced fracture through the left inferior pubic ramus. No acute fracture or dislocation of the left proximal femur. There is no evidence of arthropathy or other focal bone abnormality. IMPRESSION: 1. Displaced fracture of the parasymphyseal left pubic ramus. Questionable nondisplaced fracture through the left inferior pubic ramus. 2. No acute fracture or dislocation of the left proximal femur.  Electronically Signed   By: Agustin Cree M.D.   On: 06/12/2023 20:39   DG Pelvis Portable  Result Date: 06/12/2023 CLINICAL DATA:  Unwitnessed fall. EXAM: PORTABLE PELVIS 1-2 VIEWS COMPARISON:  May 16, 2023. FINDINGS: Minimally displaced fracture is seen involving the left superior pubic ramus. Severe degenerative changes are seen involving the sacroiliac joints. Left proximal femur somewhat rotated. IMPRESSION: Minimally displaced fracture involving the left superior pubic ramus. Left proximal femur is somewhat rotated; radiographs of left hip are recommended for further evaluation. Electronically Signed   By: Zenda Alpers.D.  On: 06/12/2023 18:56   DG Chest Port 1 View  Result Date: 06/12/2023 CLINICAL DATA:  Unwitnessed fall. EXAM: PORTABLE CHEST 1 VIEW COMPARISON:  May 16, 2023. FINDINGS: The heart size and mediastinal contours are within normal limits. Both lungs are clear. Old right rib fractures are noted. IMPRESSION: No active disease. Electronically Signed   By: Lupita Raider M.D.   On: 06/12/2023 18:53   CT Head Wo Contrast  Result Date: 06/12/2023 CLINICAL DATA:  Unwitnessed fall, head and neck trauma EXAM: CT HEAD WITHOUT CONTRAST CT CERVICAL SPINE WITHOUT CONTRAST TECHNIQUE: Multidetector CT imaging of the head and cervical spine was performed following the standard protocol without intravenous contrast. Multiplanar CT image reconstructions of the cervical spine were also generated. RADIATION DOSE REDUCTION: This exam was performed according to the departmental dose-optimization program which includes automated exposure control, adjustment of the mA and/or kV according to patient size and/or use of iterative reconstruction technique. COMPARISON:  05/16/2023 FINDINGS: CT HEAD FINDINGS Brain: Acute subdural hemorrhage overlying the left cerebral convexity, which measures up to 6 mm (series 6, image 38). There is likely some subarachnoid hemorrhage in the adjacent left temporal and  frontal lobes (series 3, image 14). No evidence of acute infarct, parenchymal hemorrhage, mass, mass effect, or midline shift. No hydrocephalus. Age related cerebral volume loss. Vascular: No hyperdense vessel. Skull: Evaluation is somewhat limited by motion. Within this limitation, negative for fracture or focal lesion. Left parietal scalp hematoma. Sinuses/Orbits: No acute finding. CT CERVICAL SPINE FINDINGS Alignment: No traumatic listhesis. Straightening and mild reversal of the normal cervical lordosis, with trace retrolisthesis of C5 on C6. Skull base and vertebrae: No acute fracture. No primary bone lesion or focal pathologic process. Soft tissues and spinal canal: No prevertebral fluid or swelling. No visible canal hematoma. Disc levels: Degenerative changes in the cervical spine. Moderate spinal canal stenosis at C5-C6. Upper chest: Negative. IMPRESSION: 1. Acute subdural hemorrhage overlying the left cerebral convexity, which measures up to 6 mm. There is likely some subarachnoid hemorrhage in the adjacent left temporal and frontal lobes. No midline shift. 2. Left parietal scalp hematoma. 3. No acute fracture or traumatic listhesis in the cervical spine. These results were called by telephone at the time of interpretation on 06/12/2023 at 6:22 pm to provider [facility] , who verbally acknowledged these results. Electronically Signed   By: Wiliam Ke M.D.   On: 06/12/2023 18:22   CT Cervical Spine Wo Contrast  Result Date: 06/12/2023 CLINICAL DATA:  Unwitnessed fall, head and neck trauma EXAM: CT HEAD WITHOUT CONTRAST CT CERVICAL SPINE WITHOUT CONTRAST TECHNIQUE: Multidetector CT imaging of the head and cervical spine was performed following the standard protocol without intravenous contrast. Multiplanar CT image reconstructions of the cervical spine were also generated. RADIATION DOSE REDUCTION: This exam was performed according to the departmental dose-optimization program which includes  automated exposure control, adjustment of the mA and/or kV according to patient size and/or use of iterative reconstruction technique. COMPARISON:  05/16/2023 FINDINGS: CT HEAD FINDINGS Brain: Acute subdural hemorrhage overlying the left cerebral convexity, which measures up to 6 mm (series 6, image 38). There is likely some subarachnoid hemorrhage in the adjacent left temporal and frontal lobes (series 3, image 14). No evidence of acute infarct, parenchymal hemorrhage, mass, mass effect, or midline shift. No hydrocephalus. Age related cerebral volume loss. Vascular: No hyperdense vessel. Skull: Evaluation is somewhat limited by motion. Within this limitation, negative for fracture or focal lesion. Left parietal scalp hematoma. Sinuses/Orbits: No acute  finding. CT CERVICAL SPINE FINDINGS Alignment: No traumatic listhesis. Straightening and mild reversal of the normal cervical lordosis, with trace retrolisthesis of C5 on C6. Skull base and vertebrae: No acute fracture. No primary bone lesion or focal pathologic process. Soft tissues and spinal canal: No prevertebral fluid or swelling. No visible canal hematoma. Disc levels: Degenerative changes in the cervical spine. Moderate spinal canal stenosis at C5-C6. Upper chest: Negative. IMPRESSION: 1. Acute subdural hemorrhage overlying the left cerebral convexity, which measures up to 6 mm. There is likely some subarachnoid hemorrhage in the adjacent left temporal and frontal lobes. No midline shift. 2. Left parietal scalp hematoma. 3. No acute fracture or traumatic listhesis in the cervical spine. These results were called by telephone at the time of interpretation on 06/12/2023 at 6:22 pm to provider [facility] , who verbally acknowledged these results. Electronically Signed   By: Wiliam Ke M.D.   On: 06/12/2023 18:22    Procedures .Critical Care  Performed by: Rexford Maus, DO Authorized by: Rexford Maus, DO   Critical care provider  statement:    Critical care time (minutes):  30   Critical care was time spent personally by me on the following activities:  Development of treatment plan with patient or surrogate, discussions with consultants, evaluation of patient's response to treatment, examination of patient, ordering and review of laboratory studies, ordering and review of radiographic studies, ordering and performing treatments and interventions, pulse oximetry, re-evaluation of patient's condition and review of old charts     Medications Ordered in ED Medications  levETIRAcetam (KEPPRA) IVPB 1500 mg/ 100 mL premix (0 mg Intravenous Stopped 06/12/23 2011)    ED Course/ Medical Decision Making/ A&P Clinical Course as of 06/12/23 2138  Tue Jun 12, 2023  1821 I received a call from radiology - 6mm acute SDH over L cerebral convexity and small SAH vs artifact. No fracture in C-spine. Will discuss with neurosurgery. [VK]  1838 I received call back from neurosurgery, they are in OR and will call back with final recs but likely does not need surgical intervention. [VK]  1906 L superior pubic ramus fracture on pelvis XR, radiology recommended L hip x-ray to further evaluate the hip. [VK]  1924 I spoke with [facility] neurosurgery who recommended repeat CTH in 8 hrs and Keppra BID x 7 days. [VK]  2047 No other traumatic injury on hip XR. Will discuss with trauma for admission to trauma vs hospitalist. [VK]  2125 I spoke with Dr. Dwain Sarna who recommended ED to ED transfer for trauma eval and admission as well as recommends CT of chest, abd pelvis to evaluate for any other traumatic injury. [VK]  2134 Dr. Suezanne Jacquet accepts the patient for ED to ED transfer. [VK]    Clinical Course User Index [VK] Rexford Maus, DO                                 Medical Decision Making This patient presents to the ED with chief complaint(s) of fall with pertinent past medical history of dementia, HTN, DM which further complicates the  presenting complaint. The complaint involves an extensive differential diagnosis and also carries with it a high risk of complications and morbidity.    The differential diagnosis includes due to patient's age and fall concern for ICH, mass effect, concern for possible syncopal fall, arrhythmia, anemia, dehydration, electrolyte abnormality, infection  Additional history obtained: Additional history obtained from  family Records reviewed Care Everywhere/External Records  ED Course and Reassessment: On patient's arrival, she is hemodynamically stable in no acute distress.  She is at her neurologic baseline per her son.  Due to her unwitnessed fall and recent fever and diarrhea, will have evaluations for traumatic injury with CT head, C-spine, chest x-ray and pelvis x-ray as well as labs including urine and she will be closely reassessed.  Independent labs interpretation:  The following labs were independently interpreted: within normal range  Independent visualization of imaging: - I independently visualized the following imaging with scope of interpretation limited to determining acute life threatening conditions related to emergency care: CTH/Cspine, CXR, pelvis XR/L hip XR, which revealed SDH/SAH, pubic ramus fracture  Consultation: - Consulted or discussed management/test interpretation w/ external professional: trauma, neurosurgery  Consideration for admission or further workup: patient requires transfer for trauma eval Social Determinants of health: N/A    Amount and/or Complexity of Data Reviewed Labs: ordered. Radiology: ordered.  Risk Prescription drug management.          Final Clinical Impression(s) / ED Diagnoses Final diagnoses:  Fall, initial encounter  SDH (subdural hematoma) (HCC)  Closed fracture of ramus of left pubis, initial encounter [facility])    Rx / DC Orders ED Discharge Orders     None         Rexford Maus, DO 06/12/23 2138

## 2023-06-12 NOTE — Progress Notes (Signed)
Pt presented to ED after a fall this PM. Patient has significant dementia at baseline per pt son. Not currently anticoagulated. No obvious focal deficits per EDP. Workup revealing acute L SDH/SAH w/o significant mass effect or MLS. Recommend repeat CTH in 8H, Keppra 500mg  BIDx7d, SBP <150. Call w questions/concerns.   Effie Wahlert Margaree Mackintosh, PA-C

## 2023-06-12 NOTE — ED Notes (Signed)
Family at bedside. 

## 2023-06-12 NOTE — ED Notes (Signed)
Patient transported to x-ray. ?

## 2023-06-12 NOTE — ED Triage Notes (Signed)
BIBA from [facility] for unwitnessed fall, unknown if hit head, no thinners. Old bruising noted to forehead, C-Collar in place. 166 cbg

## 2023-06-12 NOTE — ED Triage Notes (Signed)
Pt. Presents to the ED BIBA from [facility] for higher level of care. Pt. Presented earlier due to unwitnessed fall at facility leading to a subdural head bleed as well as a left hip fracture. Pt. Alert but disoriented at baseline. Denies thinners. VS during transfer include: BP 151/82, HR 64, 94% RA, and 18RR.

## 2023-06-13 ENCOUNTER — Emergency Department (HOSPITAL_COMMUNITY): Payer: Medicare PPO

## 2023-06-13 DIAGNOSIS — Z7189 Other specified counseling: Secondary | ICD-10-CM | POA: Diagnosis not present

## 2023-06-13 DIAGNOSIS — R296 Repeated falls: Secondary | ICD-10-CM | POA: Diagnosis present

## 2023-06-13 DIAGNOSIS — E119 Type 2 diabetes mellitus without complications: Secondary | ICD-10-CM | POA: Diagnosis present

## 2023-06-13 DIAGNOSIS — Z634 Disappearance and death of family member: Secondary | ICD-10-CM | POA: Diagnosis not present

## 2023-06-13 DIAGNOSIS — E7800 Pure hypercholesterolemia, unspecified: Secondary | ICD-10-CM | POA: Diagnosis present

## 2023-06-13 DIAGNOSIS — W19XXXA Unspecified fall, initial encounter: Principal | ICD-10-CM | POA: Diagnosis present

## 2023-06-13 DIAGNOSIS — G9341 Metabolic encephalopathy: Secondary | ICD-10-CM | POA: Diagnosis present

## 2023-06-13 DIAGNOSIS — S065XAA Traumatic subdural hemorrhage with loss of consciousness status unknown, initial encounter: Secondary | ICD-10-CM | POA: Diagnosis present

## 2023-06-13 DIAGNOSIS — Z8249 Family history of ischemic heart disease and other diseases of the circulatory system: Secondary | ICD-10-CM | POA: Diagnosis not present

## 2023-06-13 DIAGNOSIS — Z66 Do not resuscitate: Secondary | ICD-10-CM | POA: Diagnosis present

## 2023-06-13 DIAGNOSIS — Z7984 Long term (current) use of oral hypoglycemic drugs: Secondary | ICD-10-CM | POA: Diagnosis not present

## 2023-06-13 DIAGNOSIS — Z515 Encounter for palliative care: Secondary | ICD-10-CM

## 2023-06-13 DIAGNOSIS — Z833 Family history of diabetes mellitus: Secondary | ICD-10-CM | POA: Diagnosis not present

## 2023-06-13 DIAGNOSIS — Z681 Body mass index (BMI) 19 or less, adult: Secondary | ICD-10-CM | POA: Diagnosis not present

## 2023-06-13 DIAGNOSIS — Y92129 Unspecified place in nursing home as the place of occurrence of the external cause: Secondary | ICD-10-CM | POA: Diagnosis not present

## 2023-06-13 DIAGNOSIS — M8580 Other specified disorders of bone density and structure, unspecified site: Secondary | ICD-10-CM | POA: Diagnosis present

## 2023-06-13 DIAGNOSIS — S32592A Other specified fracture of left pubis, initial encounter for closed fracture: Secondary | ICD-10-CM | POA: Diagnosis present

## 2023-06-13 DIAGNOSIS — Z87891 Personal history of nicotine dependence: Secondary | ICD-10-CM | POA: Diagnosis not present

## 2023-06-13 DIAGNOSIS — G4733 Obstructive sleep apnea (adult) (pediatric): Secondary | ICD-10-CM | POA: Diagnosis present

## 2023-06-13 DIAGNOSIS — F0284 Dementia in other diseases classified elsewhere, unspecified severity, with anxiety: Secondary | ICD-10-CM | POA: Diagnosis present

## 2023-06-13 DIAGNOSIS — R636 Underweight: Secondary | ICD-10-CM | POA: Diagnosis present

## 2023-06-13 DIAGNOSIS — I10 Essential (primary) hypertension: Secondary | ICD-10-CM | POA: Diagnosis present

## 2023-06-13 DIAGNOSIS — G309 Alzheimer's disease, unspecified: Secondary | ICD-10-CM | POA: Diagnosis present

## 2023-06-13 DIAGNOSIS — Z79899 Other long term (current) drug therapy: Secondary | ICD-10-CM | POA: Diagnosis not present

## 2023-06-13 DIAGNOSIS — Z853 Personal history of malignant neoplasm of breast: Secondary | ICD-10-CM | POA: Diagnosis not present

## 2023-06-13 DIAGNOSIS — S066XAA Traumatic subarachnoid hemorrhage with loss of consciousness status unknown, initial encounter: Secondary | ICD-10-CM | POA: Diagnosis present

## 2023-06-13 DIAGNOSIS — Z86000 Personal history of in-situ neoplasm of breast: Secondary | ICD-10-CM | POA: Diagnosis not present

## 2023-06-13 LAB — GLUCOSE, CAPILLARY
Glucose-Capillary: 86 mg/dL (ref 70–99)
Glucose-Capillary: 77 mg/dL (ref 70–99)

## 2023-06-13 LAB — HEMOGLOBIN A1C
Hgb A1c MFr Bld: 5.5 % (ref 4.8–5.6)
Mean Plasma Glucose: 111.15 mg/dL

## 2023-06-13 LAB — CBG MONITORING, ED
Glucose-Capillary: 94 mg/dL (ref 70–99)
Glucose-Capillary: 103 mg/dL — ABNORMAL HIGH (ref 70–99)

## 2023-06-13 MED ORDER — ACETAMINOPHEN 650 MG RE SUPP: 650.0000 mg | Freq: Four times a day (QID) | RECTAL | Status: DC | PRN

## 2023-06-13 MED ORDER — POLYETHYLENE GLYCOL 3350 17 G PO PACK: 17.0000 g | PACK | Freq: Every day | ORAL | Status: DC | PRN

## 2023-06-13 MED ORDER — ENSURE ENLIVE PO LIQD
237.0000 mL | Freq: Two times a day (BID) | ORAL | Status: DC
  Administered 2023-06-14 – 2023-06-16 (×6): 237 mL via ORAL

## 2023-06-13 MED ORDER — INSULIN ASPART 100 UNIT/ML IJ SOLN
0.0000 [IU] | Freq: Three times a day (TID) | INTRAMUSCULAR | Status: DC
  Administered 2023-06-14: 2 [IU] via SUBCUTANEOUS
  Administered 2023-06-15 (×2): 1 [IU] via SUBCUTANEOUS
  Administered 2023-06-15: 2 [IU] via SUBCUTANEOUS
  Administered 2023-06-16: 1 [IU] via SUBCUTANEOUS

## 2023-06-13 MED ORDER — HYDRALAZINE HCL 20 MG/ML IJ SOLN: 10.0000 mg | Freq: Four times a day (QID) | INTRAMUSCULAR | Status: DC | PRN

## 2023-06-13 MED ORDER — ACETAMINOPHEN 325 MG PO TABS
650.0000 mg | ORAL_TABLET | Freq: Four times a day (QID) | ORAL | Status: DC | PRN
  Administered 2023-06-15: 650 mg via ORAL
  Filled 2023-06-13: qty 2

## 2023-06-13 MED ORDER — OXYCODONE HCL 5 MG PO TABS: 5.0000 mg | ORAL_TABLET | ORAL | Status: DC | PRN

## 2023-06-13 MED ORDER — INFLUENZA VAC A&B SURF ANT ADJ 0.5 ML IM SUSY
0.5000 mL | PREFILLED_SYRINGE | INTRAMUSCULAR | Status: DC
Start: 2023-06-14 — End: 2023-06-16
  Filled 2023-06-13: qty 0.5

## 2023-06-13 MED ORDER — INSULIN ASPART 100 UNIT/ML IJ SOLN: 0.0000 [IU] | Freq: Every day | INTRAMUSCULAR | Status: DC

## 2023-06-13 MED ORDER — ALBUTEROL SULFATE (2.5 MG/3ML) 0.083% IN NEBU: 2.5000 mg | INHALATION_SOLUTION | Freq: Four times a day (QID) | RESPIRATORY_TRACT | Status: DC | PRN

## 2023-06-13 NOTE — Consult Note (Signed)
Palliative Care Consult Note                                  Date: 06/13/2023   Patient Name: Karen [name]  DOB: 1941-06-12  MRN: [id number]  Age / Sex: 82 y.o., female  PCP: Garlan Fillers, MD Referring Physician: Lorin Glass, MD  Reason for Consultation: {Reason for Consult:23484}  HPI/Patient Profile: 82 y.o. female  with past medical history of *** admitted on 06/12/2023 with ***.   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  I met with ***.   We meet to discuss diagnosis prognosis, GOC, EOL wishes, disposition and options. Concept of Palliative Care was introduced as specialized medical care for people and their families living with serious illness.  If focuses on providing relief from the symptoms and stress of a serious illness.  The goal is to improve quality of life for both the patient and the family. Values and goals of care important to patient and family were attempted to be elicited.  ***  Created space and opportunity for patient  and family to explore thoughts and feelings regarding current medical situation   Natural trajectory and current clinical status were discussed. Questions and concerns addressed. Patient  encouraged to call with questions or concerns.    Patient/Family Understanding of Illness: ***  Life Review: ***  Patient  Values: ***  Goals: ***  Today's Discussion: ***  Review of Systems  Objective:   Primary Diagnoses: Present on Admission:  Fall, initial encounter  Dementia (HCC)  Hyperlipidemia   Physical Exam  Vital Signs:  BP 134/62   Pulse 68   Temp 98.3 F (36.8 C)   Resp 16   SpO2 95%   Palliative Assessment/Data: ***    Advanced Care Planning:   Existing Vynca/ACP Documentation: ***  Primary Decision Maker: {Primary Decision WNUUV:25366}  Code Status/Advance Care Planning: {Palliative Code status:23503}  A discussion was had today regarding advanced directives. Concepts specific to code status, artifical feeding and hydration, continued IV antibiotics and rehospitalization was had.  The difference between a aggressive medical intervention path and a palliative comfort care path for this patient at this time was had. ***The MOST form was introduced and discussed.***  Decisions/Changes to ACP: ***  Assessment & Plan:   Impression: ***  SUMMARY OF RECOMMENDATIONS   ***  Symptom Management:  ***  Prognosis:  {Palliative Care Prognosis:23504}  Discharge Planning:  {Palliative dispostion:23505}   Discussed with: ***    Thank you for allowing [place] to participate in the care of LILLIBETH BRECKON PMT will continue to support holistically.  Time Total: ***  Detailed review of medical records (labs, imaging, vital signs), medically appropriate exam, discussed with treatment team, counseling and education to patient, family, & staff, documenting clinical information, medication  management, coordination of care  Signed by: Wynne Dust, NP Palliative Medicine Team  Team Phone # [phone] (Nights/Weekends)  06/13/2023, 2:43 PM

## 2023-06-13 NOTE — Plan of Care (Signed)

## 2023-06-13 NOTE — ED Provider Notes (Addendum)
1105 D/w Dr. Hillery Hunter of trauma who will see the patient, admit to medicine    Famous Eisenhardt, MD 06/13/23 [phone]

## 2023-06-13 NOTE — H&P (Signed)
[facility] History and Physical  SICLALY HANISCH WUJ:[id number] DOB: Dec 09, 1940 DOA: 06/12/2023 PCP: Garlan Fillers, MD  Presented from: Long-term nursing facility memory care unit Chief Complaint: Fall  History of Present Illness: Karen [name] is a 82 y.o. female with PMH significant for dementia, DM2, HTN, HLD, OSA on CPAP, breast cancer s/p mastectomy, chemo, radiation, anxiety, arthritis. Long-term resident at the memory care unit at [facility].  At baseline, patient is demented, able to have some conversation, supposed to walk with a walker but refuses.  12/3, patient had an unwitnessed fall, unknown if she hit her head. EMS brought her to [facility] ED with put c-collar  In the ED, patient was afebrile, hemodynamically stable, breathing on room air CBC, CMP mostly unremarkable Urinalysis unremarkable for infection CT head showed an acute subdural hemorrhage overlying the left cerebral convexity, which measures up to 6 mm. There is likely some subarachnoid hemorrhage in the adjacent left temporal and frontal lobes. No midline shift.  It also showed left parietal scalp hematoma. CT cervical spine did not show any acute fracture or traumatic listhesis in the cervical spine. CT chest abdomen pelvis showed  -acute displaced left superior pubic rami fracture, acute nondisplaced left inferior pubic rami fracture. -Circumferential urinary bladder wall thickening as well as blood product along the left space of retzius.  Extraperitoneal/intraperitoneal urinary bladder injury not excluded. No evidence of intrathoracic, intra-abdominal or spinal injury  EDP discussed the case with neurosurgery on-call Esperanza Richters, did not recommend any surgical intervention and recommended repeat CT head in 8 hours and also to start Keppra twice daily for 7 days EDP discussed with trauma on-call Dr. Dwain Sarna who recommended transfer to [facility] ED CT head was repeated as recommended and did  not show any change. Hospitalist service was consulted for inpatient monitoring and management.  I received this patient as a carryover admission this morning  At the time of my evaluation, patient was somnolent.  Open eyes on touch, did not answer and fell back to sleep.  Per RN, she had episodes of agitation and is managed with mittens.  She has not received any sedative other than Keppra last night. I called and discussed with her daughter-in-law [name].  History reviewed and detailed as above. Per report, patient had fever and diarrhea few days ago.  Unable to get any details of this history at this time.  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, had mastectomy, chemo/radiation   Diabetes mellitus without complication (HCC)    High cholesterol    HTN (hypertension)    Malignant neoplasm of breast (female), unspecified site    left   Osteopenia    Sleep apnea    uses CPAP nightly    Past surgical history: Past Surgical History:  Procedure Laterality Date   BREAST LUMPECTOMY Right 1990's   BREAST LUMPECTOMY WITH RADIOACTIVE SEED LOCALIZATION Left 12/29/2014   Procedure: LEFT BREAST LUMPECTOMY WITH RADIOACTIVE SEED LOCALIZATION;  Surgeon: Claud Kelp, MD;  Location: [facility];  Service: General;  Laterality: Left;   CATARACT EXTRACTION  07/07/14   left eye   COMBINED LAPAROSCOPY W/ HYSTEROSCOPY     MASTECTOMY Right 02/2014   TOTAL MASTECTOMY Right 02/09/2014  Procedure: RIGHT TOTAL MASTECTOMY;  Surgeon: Ernestene Mention, MD;  Location: [facility] OR;  Service: General;  Laterality: Right;   TUBAL LIGATION      Social History:  reports that she quit smoking about 32 years ago. Her smoking use included cigarettes. She started smoking about 62  years ago. She has a 15 pack-year smoking history. She has never used smokeless tobacco. She reports that she does not drink alcohol and does not use drugs.  Allergies:  No Known Allergies Patient has no known allergies.   Family history:  Family History  Problem Relation Age of Onset   Hypertension Father    CVA Father    Diabetes Mother    Heart disease Mother    Diabetes Brother    CVA Brother    Breast cancer Paternal Aunt        dx 47s; deceased 53s   Breast cancer Paternal Aunt        dx late 22s; deceased 67   Leukemia Cousin        2 paternal female cousins     Physical Exam: Vitals:   06/13/23 0500 06/13/23 0600 06/13/23 0906 06/13/23 0941  BP: 113/73 (!) 118/52 (!) 169/63   Pulse: 68 69 62   Resp: 12 13 13    Temp:  98.4 F (36.9 C)  98.3 F (36.8 C)  TempSrc:  Oral    SpO2: 100% 100% 99%    Wt Readings from Last 3 Encounters:  04/07/22 49.9 kg  08/11/19 49.9 kg  09/17/18 49.7 kg   There is no height or weight on file to calculate BMI.  General exam: Pleasant elderly female, not in distress Skin: No rashes, lesions or ulcers. HEENT: Atraumatic, normocephalic, no obvious bleeding Lungs: Clear to auscultation bilaterally CVS: Regular rate and rhythm, no murmur GI/Abd soft, nontender, nondistended, bowel sound. CNS: Somnolent, opens eyes and touch, unable to have a conversation.  Demented at baseline Psychiatry: Unable to examine because of altered mentation Extremities: No pedal edema, no calf tenderness   ------------------------------------------------------------------------------------------------------ Assessment/Plan: Principal Problem:   Subdural hematoma (HCC) Active Problems:   Hyperlipidemia   Dementia (HCC)   Fall, initial encounter  Acute SDH Possible SAH Secondary to fall CT head showed an acute subdural hemorrhage overlying the left cerebral convexity, which measures up to 6 mm. There is likely some subarachnoid hemorrhage in the  adjacent left temporal and frontal lobes. No midline shift. EDP discussed the case with neurosurgery on-call Esperanza Richters, did not recommend any surgical intervention and recommended repeat CT head in 8 hours  Repeat CT head did not show any change As recommended by neurosurgery, patient is currently on Keppra twice daily   Acute metabolic encephalopathy  dementia, anxiety Demented at baseline.  Per family, able to have some conversation. Somnolent at this time with intermittent agitation.  Probably because of brain bleed. Avoid any IV sedative PTA meds-Depakote 125 mg daily, Namenda 10 mg daily, Aricept 10 mg at bedtime Zoloft 50 mg daily, Remeron 7.5 mg nightly Xanax 0.5 mg twice daily as needed, Given head injury, I would keep mood altering medications on hold at this time.  Pelvic rami fracture Secondary to fall Imaging showed acute displaced left superior pubic rami fracture, acute nondisplaced left inferior pubic rami fracture. Nonsurgical intervention per trauma team Pain management with PRN Tylenol, oxycodone   Unwitnessed fall leading to multiple injuries Individual injuries and management plan as above Has baseline impaired mobility.  Supposed to use a walker but does not  PT eval  Abnormal imaging findings of the bladder CT pelvis suspected bladder injury but urinalysis unremarkable Trauma team aware.  Hypertension PTA meds- metoprolol 25 mg twice daily, valsartan 160 mg daily Blood pressure and heart rate currently normal range Continue to monitor and resume as needed  Type 2 diabetes mellitus A1c pending PTA meds-metformin 500 mg twice daily.  Currently on hold Start SSI/Accu-Cheks Recent Labs  Lab 06/13/23 0810  GLUCAP 94   Hyperlipidemia Continue simvastatin when able to  H/o breast cancer s/p mastectomy, chemo, radiation  Mobility: PT eval  Goals of care   Code Status: Full Code.  Confirmed with daughter in law.  She is willing to discuss with  palliative care.  Consult placed   DVT prophylaxis:  SCDs Start: 06/13/23 1610   Antimicrobials: None Fluid: None Consultants: Neurosurgery, trauma surgery Family Communication: Discussed with daughter-in-law on the phone  Dispo: The patient is from: Memory care unit              Anticipated d/c is to: Hopefully back to memory care unit in 1 to 2 days  Diet: Diet Order             Diet heart healthy/carb modified Room service appropriate? Yes; Fluid consistency: Thin  Diet effective now                    ------------------------------------------------------------------------------------- Severity of Illness: The appropriate patient status for this patient is INPATIENT. Inpatient status is judged to be reasonable and necessary in order to provide the required intensity of service to ensure the patient's safety. The patient's presenting symptoms, physical exam findings, and initial radiographic and laboratory data in the context of their chronic comorbidities is felt to place them at high risk for further clinical deterioration. Furthermore, it is not anticipated that the patient will be medically stable for discharge from the hospital within 2 midnights of admission.   * I certify that at the point of admission it is my clinical judgment that the patient will require inpatient hospital care spanning beyond 2 midnights from the point of admission due to high intensity of service, high risk for further deterioration and high frequency of surveillance required.* -------------------------------------------------------------------------------------   Home Meds: Prior to Admission medications   Medication Sig Start Date End Date Taking? Authorizing Provider  acetaminophen (TYLENOL) 500 MG tablet Take 500 mg by mouth every 8 (eight) hours as needed for mild pain (pain score 1-3) or fever.   Yes [provider]  ALPRAZolam Prudy Feeler) 0.5 MG tablet Take 0.5 mg by mouth 2 (two)  times daily as needed for anxiety. 05/07/23  Yes [provider]  cetirizine (ZYRTEC) 10 MG tablet Take 10 mg by mouth daily.   Yes [provider]  Cholecalciferol 50 MCG (2000 UT) TABS Take 1 tablet by mouth daily.   Yes [provider]  divalproex (DEPAKOTE SPRINKLE) 125 MG capsule Take 125 mg by mouth at bedtime. 06/04/23  Yes [provider]  donepezil (ARICEPT) 10 MG tablet Take 1 tablet (10 mg total) by mouth at bedtime. 08/25/19  Yes Lomax, Amy, NP  memantine (NAMENDA) 10 MG tablet TAKE 1 TABLET BY MOUTH TWICE A DAY 12/27/20  Yes Lomax, Amy, NP  metFORMIN (GLUCOPHAGE-XR) 500 MG 24 hr tablet Take 500 mg by mouth every evening.  08/17/14  Yes [provider]  metoprolol tartrate (LOPRESSOR) 25 MG tablet Take 25 mg by mouth 2 (two) times daily.  03/06/13  Yes [provider]  mirtazapine (REMERON) 7.5 MG tablet SMARTSIG:1.0 Tablet(s) By Mouth Every Night 06/04/23  Yes [provider]  montelukast (SINGULAIR) 10 MG tablet SMARTSIG:1.0 Tablet(s) By Mouth Daily 06/04/23  Yes [provider]  MYRBETRIQ 8 MG/ML SRER Take 3 mLs by mouth daily. 05/28/23  Yes [provider]  sertraline (ZOLOFT) 50 MG tablet Take 50 mg by mouth daily.  05/07/15  Yes [provider]  simvastatin (ZOCOR) 40 MG tablet SMARTSIG:1.0 Tablet(s) By Mouth Every Night 06/04/23  Yes [provider]  tacrolimus (PROTOPIC) 0.03 % ointment Apply 1 Application topically 2 (two) times daily. Apply to affected areas on chest and arms 05/05/23  Yes [provider]  valsartan (DIOVAN) 160 MG tablet SMARTSIG:1.0 Tablet(s) By Mouth Daily 06/04/23  Yes [provider]    Labs on Admission:   CBC: Recent Labs  Lab 06/12/23 1838  WBC 5.8  NEUTROABS 4.3  HGB 11.7*  HCT 36.5  MCV 94.3  PLT 132*    Basic Metabolic Panel: Recent Labs  Lab 06/12/23 1838  NA 141  K 4.0  CL 106  CO2 28  GLUCOSE 111*  BUN 57*   CREATININE 1.07*  CALCIUM 9.1    Liver Function Tests: Recent Labs  Lab 06/12/23 1838  AST 37  ALT 19  ALKPHOS 52  BILITOT 0.5  PROT 7.6  ALBUMIN 4.2   No results for input(s): "LIPASE", "AMYLASE" in the last 168 hours. No results for input(s): "AMMONIA" in the last 168 hours.  Cardiac Enzymes: No results for input(s): "CKTOTAL", "CKMB", "CKMBINDEX", "TROPONINI" in the last 168 hours.  BNP (last 3 results) No results for input(s): "BNP" in the last 8760 hours.  ProBNP (last 3 results) No results for input(s): "PROBNP" in the last 8760 hours.  CBG: Recent Labs  Lab 06/13/23 0810  GLUCAP 94    Lipase  No results found for: "LIPASE"   Urinalysis    Component Value Date/Time   COLORURINE YELLOW 06/12/2023 1921   APPEARANCEUR CLEAR 06/12/2023 1921   LABSPEC 1.023 06/12/2023 1921   PHURINE 6.0 06/12/2023 1921   GLUCOSEU NEGATIVE 06/12/2023 1921   HGBUR NEGATIVE 06/12/2023 1921   BILIRUBINUR NEGATIVE 06/12/2023 1921   KETONESUR NEGATIVE 06/12/2023 1921   PROTEINUR 30 (A) 06/12/2023 1921   UROBILINOGEN 0.2 10/31/2009 0054   NITRITE NEGATIVE 06/12/2023 1921   LEUKOCYTESUR NEGATIVE 06/12/2023 1921     Drugs of Abuse  No results found for: "LABOPIA", "COCAINSCRNUR", "LABBENZ", "AMPHETMU", "THCU", "LABBARB"    Radiological Exams on Admission: CT HEAD WO CONTRAST ( )  Result Date: 06/13/2023 CLINICAL DATA:  Subdural hematoma follow-up EXAM: CT HEAD WITHOUT CONTRAST TECHNIQUE: Contiguous axial images were obtained from the base of the skull through the vertex without intravenous contrast. RADIATION DOSE REDUCTION: This exam was performed according to the departmental dose-optimization program which includes automated exposure control, adjustment of the mA and/or kV according to patient size and/or use of iterative reconstruction technique. COMPARISON:  06/12/2023 FINDINGS: Brain: Unchanged appearance of small left convexity subdural hematoma allowing for  redistribution. Persistent subarachnoid blood over the left parietal lobe and in the left lateral ventricle. No new site of hemorrhage. Size and configuration ventricles are unchanged. Vascular: There is atherosclerotic calcification of both internal carotid arteries at the skull base. Skull: Left parietal scalp hematoma Sinuses/Orbits: Paranasal sinuses are clear. No mastoid effusion. Normal orbits. Other: None IMPRESSION: 1. Unchanged appearance of small left convexity subdural hematoma allowing for redistribution. 2. Persistent subarachnoid blood over the left parietal lobe and in the  left lateral ventricle. 3. No new site of hemorrhage. Electronically Signed   By: Deatra Robinson M.D.   On: 06/13/2023 02:22   CT CHEST ABDOMEN PELVIS W CONTRAST  Result Date: 06/12/2023 CLINICAL DATA:  Polytrauma, blunt or unwitnessed fall, unknown if hit head, no thinners. Old bruising noted to forehead, C-Collar in place. EXAM: CT CHEST, ABDOMEN, AND PELVIS WITH CONTRAST TECHNIQUE: Multidetector CT imaging of the chest, abdomen and pelvis was performed following the standard protocol during bolus administration of intravenous contrast. RADIATION DOSE REDUCTION: This exam was performed according to the departmental dose-optimization program which includes automated exposure control, adjustment of the mA and/or kV according to patient size and/or use of iterative reconstruction technique. CONTRAST:  OMNIPAQUE IOHEXOL 300 MG/ML  SOLN COMPARISON:  CT abdomen pelvis 01/11/2005, x-ray pelvis 05/16/2023, x-ray pelvis 06/12/2023 FINDINGS: CHEST: Cardiovascular: No aortic injury. The thoracic aorta is normal in caliber. The heart is normal in size. No significant pericardial effusion. The main pulmonary artery is normal in caliber. No central or proximal segmental pulmonary embolus. No a calcification. Aortic valve leaflet calcifications. At least 3 vessel coronary calcification. Severe atherosclerotic plaque of the aorta.  Mediastinum/Nodes: No pneumomediastinum. No mediastinal hematoma. The esophagus is unremarkable. The thyroid is unremarkable. The central airways are patent. No mediastinal, hilar, or axillary lymphadenopathy. Lungs/Pleura: No focal consolidation. No pulmonary nodule. No pulmonary mass. No pulmonary contusion or laceration. No pneumatocele formation. No pleural effusion. No pneumothorax. No hemothorax. Musculoskeletal/Chest wall: No chest wall mass. Old healed right rib fractures. No acute rib or sternal fracture. No spinal fracture. No acute fracture of the partially visualized upper extremities with motion artifact noted on this CT and correlated on the scout. ABDOMEN / PELVIS: Hepatobiliary: Not enlarged. No focal lesion. No laceration or subcapsular hematoma. The gallbladder is otherwise unremarkable with no radio-opaque gallstones. No biliary ductal dilatation. Pancreas: Normal pancreatic contour. No main pancreatic duct dilatation. Spleen: Not enlarged. No focal lesion. No laceration, subcapsular hematoma, or vascular injury. Adrenals/Urinary Tract: No nodularity bilaterally. Bilateral kidneys enhance symmetrically. No hydronephrosis. No contusion, laceration, or subcapsular hematoma. No injury to the vascular structures or collecting systems. No hydroureter. The urinary bladder is unremarkable. Stomach/Bowel: No small or large bowel wall thickening or dilatation. The appendix is unremarkable. Vasculature/Lymphatics: Severe atherosclerotic plaque no abdominal aorta or iliac aneurysm. No active contrast extravasation or pseudoaneurysm. No abdominal, pelvic, inguinal lymphadenopathy. Reproductive: Uterus and bilateral adnexal regions are unremarkable. Other: Trace high density free fluid along the left space of Retzius. No simple free fluid ascites. No pneumoperitoneum. No hemoperitoneum. No mesenteric hematoma identified. No organized fluid collection. Musculoskeletal: No significant soft tissue hematoma.  Bilateral sacroiliac joint sclerosis. Acute displaced left superior pubic rami fracture. Acute nondisplaced left inferior pubic rami fracture. No spinal fracture. Grade 1 anterolisthesis of L4 on L5. Ports and Devices: None. IMPRESSION: 1. Acute displaced left superior pubic rami fracture. Acute nondisplaced left inferior pubic rami fracture. 2. Circumferential urinary bladder wall thickening as well as trace blood product along the left space of Retzius. Extraperitoneal as well as intraperitoneal urinary bladder injury not excluded. Correlate with urinalysis and consider cystogram for further evaluation if clinically indicated. 3. No acute intrathoracic or intra-abdominal traumatic injury. 4. No acute fracture or traumatic malalignment of the thoracic or lumbar spine. 5. Aortic Atherosclerosis (ICD10-I70.0) including mitral annular, coronary artery, and aortic valve leaflet calcifications-correlate for aortic stenosis. Electronically Signed   By: Tish Frederickson M.D.   On: 06/12/2023 22:39   DG Hip Unilat With Pelvis 2-3  Views Left  Result Date: 06/12/2023 CLINICAL DATA:  Status post fall. Rotated appearance of the left proximal femur on pelvis radiographs EXAM: DG HIP (WITH OR WITHOUT PELVIS) 3V LEFT COMPARISON:  Same-day pelvis radiograph FINDINGS: Again seen is displaced fracture of the parasymphyseal left pubic ramus. Questionable nondisplaced fracture through the left inferior pubic ramus. No acute fracture or dislocation of the left proximal femur. There is no evidence of arthropathy or other focal bone abnormality. IMPRESSION: 1. Displaced fracture of the parasymphyseal left pubic ramus. Questionable nondisplaced fracture through the left inferior pubic ramus. 2. No acute fracture or dislocation of the left proximal femur. Electronically Signed   By: Agustin Cree M.D.   On: 06/12/2023 20:39   DG Pelvis Portable  Result Date: 06/12/2023 CLINICAL DATA:  Unwitnessed fall. EXAM: PORTABLE PELVIS 1-2 VIEWS  COMPARISON:  May 16, 2023. FINDINGS: Minimally displaced fracture is seen involving the left superior pubic ramus. Severe degenerative changes are seen involving the sacroiliac joints. Left proximal femur somewhat rotated. IMPRESSION: Minimally displaced fracture involving the left superior pubic ramus. Left proximal femur is somewhat rotated; radiographs of left hip are recommended for further evaluation. Electronically Signed   By: Lupita Raider M.D.   On: 06/12/2023 18:56   DG Chest Port 1 View  Result Date: 06/12/2023 CLINICAL DATA:  Unwitnessed fall. EXAM: PORTABLE CHEST 1 VIEW COMPARISON:  May 16, 2023. FINDINGS: The heart size and mediastinal contours are within normal limits. Both lungs are clear. Old right rib fractures are noted. IMPRESSION: No active disease. Electronically Signed   By: Lupita Raider M.D.   On: 06/12/2023 18:53   CT Head Wo Contrast  Result Date: 06/12/2023 CLINICAL DATA:  Unwitnessed fall, head and neck trauma EXAM: CT HEAD WITHOUT CONTRAST CT CERVICAL SPINE WITHOUT CONTRAST TECHNIQUE: Multidetector CT imaging of the head and cervical spine was performed following the standard protocol without intravenous contrast. Multiplanar CT image reconstructions of the cervical spine were also generated. RADIATION DOSE REDUCTION: This exam was performed according to the departmental dose-optimization program which includes automated exposure control, adjustment of the mA and/or kV according to patient size and/or use of iterative reconstruction technique. COMPARISON:  05/16/2023 FINDINGS: CT HEAD FINDINGS Brain: Acute subdural hemorrhage overlying the left cerebral convexity, which measures up to 6 mm (series 6, image 38). There is likely some subarachnoid hemorrhage in the adjacent left temporal and frontal lobes (series 3, image 14). No evidence of acute infarct, parenchymal hemorrhage, mass, mass effect, or midline shift. No hydrocephalus. Age related cerebral volume loss.  Vascular: No hyperdense vessel. Skull: Evaluation is somewhat limited by motion. Within this limitation, negative for fracture or focal lesion. Left parietal scalp hematoma. Sinuses/Orbits: No acute finding. CT CERVICAL SPINE FINDINGS Alignment: No traumatic listhesis. Straightening and mild reversal of the normal cervical lordosis, with trace retrolisthesis of C5 on C6. Skull base and vertebrae: No acute fracture. No primary bone lesion or focal pathologic process. Soft tissues and spinal canal: No prevertebral fluid or swelling. No visible canal hematoma. Disc levels: Degenerative changes in the cervical spine. Moderate spinal canal stenosis at C5-C6. Upper chest: Negative. IMPRESSION: 1. Acute subdural hemorrhage overlying the left cerebral convexity, which measures up to 6 mm. There is likely some subarachnoid hemorrhage in the adjacent left temporal and frontal lobes. No midline shift. 2. Left parietal scalp hematoma. 3. No acute fracture or traumatic listhesis in the cervical spine. These results were called by telephone at the time of interpretation on 06/12/2023 at 6:22 pm  to provider Elayne Snare , who verbally acknowledged these results. Electronically Signed   By: Wiliam Ke M.D.   On: 06/12/2023 18:22   CT Cervical Spine Wo Contrast  Result Date: 06/12/2023 CLINICAL DATA:  Unwitnessed fall, head and neck trauma EXAM: CT HEAD WITHOUT CONTRAST CT CERVICAL SPINE WITHOUT CONTRAST TECHNIQUE: Multidetector CT imaging of the head and cervical spine was performed following the standard protocol without intravenous contrast. Multiplanar CT image reconstructions of the cervical spine were also generated. RADIATION DOSE REDUCTION: This exam was performed according to the departmental dose-optimization program which includes automated exposure control, adjustment of the mA and/or kV according to patient size and/or use of iterative reconstruction technique. COMPARISON:  05/16/2023 FINDINGS: CT HEAD  FINDINGS Brain: Acute subdural hemorrhage overlying the left cerebral convexity, which measures up to 6 mm (series 6, image 38). There is likely some subarachnoid hemorrhage in the adjacent left temporal and frontal lobes (series 3, image 14). No evidence of acute infarct, parenchymal hemorrhage, mass, mass effect, or midline shift. No hydrocephalus. Age related cerebral volume loss. Vascular: No hyperdense vessel. Skull: Evaluation is somewhat limited by motion. Within this limitation, negative for fracture or focal lesion. Left parietal scalp hematoma. Sinuses/Orbits: No acute finding. CT CERVICAL SPINE FINDINGS Alignment: No traumatic listhesis. Straightening and mild reversal of the normal cervical lordosis, with trace retrolisthesis of C5 on C6. Skull base and vertebrae: No acute fracture. No primary bone lesion or focal pathologic process. Soft tissues and spinal canal: No prevertebral fluid or swelling. No visible canal hematoma. Disc levels: Degenerative changes in the cervical spine. Moderate spinal canal stenosis at C5-C6. Upper chest: Negative. IMPRESSION: 1. Acute subdural hemorrhage overlying the left cerebral convexity, which measures up to 6 mm. There is likely some subarachnoid hemorrhage in the adjacent left temporal and frontal lobes. No midline shift. 2. Left parietal scalp hematoma. 3. No acute fracture or traumatic listhesis in the cervical spine. These results were called by telephone at the time of interpretation on 06/12/2023 at 6:22 pm to provider [facility] , who verbally acknowledged these results. Electronically Signed   By: Wiliam Ke M.D.   On: 06/12/2023 18:22     Signed, Lorin Glass, MD [facility] 06/13/2023

## 2023-06-13 NOTE — ED Notes (Signed)
Patient transported to CT 

## 2023-06-13 NOTE — Consult Note (Signed)
Karen [name] 11-Jul-1940  [id number].    Requesting MD: Theresia Lo Chief Complaint/Reason for Consult: Fall  HPI:  82 y/o F w/ a hx of dementia (not oriented at baseline) who presented to [facility] after sustaining multiple recent falls, including one today.  Her sons are at bedside and report that she is in her usual state of health. She lives in a facility and is supposed to use a walker for assistance, however, she typically tried to ambulate on her own and often falls.  She underwent imaging that showed a SDH, nondisplaced pubic rami fx, as well as ?bladder thickening.  Her UA was unremarkable and she has been AF and HDS.  On exam, patient is confused but in NAD.    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, had mastectomy, chemo/radiation   Diabetes mellitus without complication (HCC)    High cholesterol    HTN (hypertension)    Malignant neoplasm of breast (female), unspecified site    left   Osteopenia    Sleep apnea    uses CPAP nightly    Past Surgical History:  Procedure Laterality Date   BREAST LUMPECTOMY Right 1990's   BREAST LUMPECTOMY WITH RADIOACTIVE SEED LOCALIZATION Left 12/29/2014   Procedure: LEFT BREAST LUMPECTOMY WITH RADIOACTIVE SEED LOCALIZATION;  Surgeon: Claud Kelp, MD;  Location:  SURGERY CENTER;  Service: General;  Laterality: Left;   CATARACT EXTRACTION  07/07/14   left eye   COMBINED LAPAROSCOPY W/ HYSTEROSCOPY     MASTECTOMY Right 02/2014   TOTAL  MASTECTOMY Right 02/09/2014   Procedure: RIGHT TOTAL MASTECTOMY;  Surgeon: Ernestene Mention, MD;  Location: [facility] OR;  Service: General;  Laterality: Right;   TUBAL LIGATION      Social History:  reports that she quit smoking about 32 years ago. Her smoking use included cigarettes. She started smoking about 62 years ago. She has a 15 pack-year smoking history. She has never used smokeless tobacco. She reports that she does not drink alcohol and does not use drugs.  Allergies: No Known Allergies  (Not in a hospital admission)   Physical Exam: Blood pressure 130/67, pulse (!) 33, temperature 98.8 F (37.1 C), temperature source Oral, resp. rate 19, SpO2 92%. Gen: elderly female, confused HEENT: atraumatic Chest: no deformity, no reaction to palpation  CV: RRR Abd: Soft, non-distended, no reaction to palpation Pelvis: stable, no reaction to palpation Back: no stepoffs or deformity Neuro: moving all extremities   Results for orders placed or performed during the hospital encounter of 06/12/23 (from the past 48 hour(s))  Comprehensive metabolic panel     Status: Abnormal   Collection Time: 06/12/23  6:38 PM  Result Value Ref Range   Sodium 141 135 - 145 mmol/L  Potassium 4.0 3.5 - 5.1 mmol/L   Chloride 106 98 - 111 mmol/L   CO2 28 22 - 32 mmol/L   Glucose, Bld 111 (H) 70 - 99 mg/dL    Comment: Glucose reference range applies only to samples taken after fasting for at least 8 hours.   BUN 57 (H) 8 - 23 mg/dL   Creatinine, Ser 2.95 (H) 0.44 - 1.00 mg/dL   Calcium 9.1 8.9 - 62.1 mg/dL   Total Protein 7.6 6.5 - 8.1 g/dL   Albumin 4.2 3.5 - 5.0 g/dL   AST 37 15 - 41 U/L   ALT 19 0 - 44 U/L   Alkaline Phosphatase 52 38 - 126 U/L   Total Bilirubin 0.5 <1.2 mg/dL   GFR, Estimated 52 (L) >60 mL/min    Comment: (NOTE) Calculated using the CKD-EPI Creatinine Equation (2021)    Anion gap 7 5 - 15    Comment: Performed at [facility], [address], [place], [place]  [address]  CBC with Differential     Status: Abnormal   Collection Time: 06/12/23  6:38 PM  Result Value Ref Range   WBC 5.8 4.0 - 10.5 K/uL   RBC 3.87 3.87 - 5.11 MIL/uL   Hemoglobin 11.7 (L) 12.0 - 15.0 g/dL   HCT 78.4 69.6 - 29.5 %   MCV 94.3 80.0 - 100.0 fL   MCH 30.2 26.0 - 34.0 pg   MCHC 32.1 30.0 - 36.0 g/dL   RDW 28.4 13.2 - 44.0 %   Platelets 132 (L) 150 - 400 K/uL   nRBC 0.0 0.0 - 0.2 %   Neutrophils Relative % 74 %   Neutro Abs 4.3 1.7 - 7.7 K/uL   Lymphocytes Relative 15 %   Lymphs Abs 0.9 0.7 - 4.0 K/uL   Monocytes Relative 8 %   Monocytes Absolute 0.5 0.1 - 1.0 K/uL   Eosinophils Relative 1 %   Eosinophils Absolute 0.1 0.0 - 0.5 K/uL   Basophils Relative 1 %   Basophils Absolute 0.0 0.0 - 0.1 K/uL   Immature Granulocytes 1 %   Abs Immature Granulocytes 0.03 0.00 - 0.07 K/uL    Comment: Performed at [facility], [address] [place], [place] [address]  Urinalysis, w/ Reflex to Culture (Infection Suspected) -Urine, Clean Catch     Status: Abnormal   Collection Time: 06/12/23  7:21 PM  Result Value Ref Range   Specimen Source URINE, CLEAN CATCH    Color, Urine YELLOW YELLOW   APPearance CLEAR CLEAR   Specific Gravity, Urine 1.023 1.005 - 1.030   pH 6.0 5.0 - 8.0   Glucose, UA NEGATIVE NEGATIVE mg/dL   Hgb urine dipstick NEGATIVE NEGATIVE   Bilirubin Urine NEGATIVE NEGATIVE   Ketones, ur NEGATIVE NEGATIVE mg/dL   Protein, ur 30 (A) NEGATIVE mg/dL   Nitrite NEGATIVE NEGATIVE   Leukocytes,Ua NEGATIVE NEGATIVE   RBC / HPF 0-5 0 - 5 RBC/hpf   WBC, UA 0-5 0 - 5 WBC/hpf    Comment:        Reflex urine culture not performed if WBC <=10, OR if Squamous epithelial cells >5. If Squamous epithelial cells >5 suggest recollection.    Bacteria, UA NONE SEEN NONE SEEN   Squamous Epithelial / HPF 0-5 0 - 5 /HPF   Mucus PRESENT     Comment: Performed at [facility], [address] [place], [place] [address]   CT CHEST ABDOMEN  PELVIS W CONTRAST  Result Date: 06/12/2023 CLINICAL DATA:  Polytrauma, blunt or unwitnessed fall, unknown if hit head, no thinners. Old bruising noted to forehead, C-Collar in place. EXAM: CT CHEST, ABDOMEN, AND PELVIS WITH CONTRAST TECHNIQUE: Multidetector CT imaging of the chest, abdomen and pelvis was performed following the standard protocol during bolus administration of intravenous contrast. RADIATION DOSE REDUCTION: This exam was performed according to the departmental dose-optimization program which includes automated exposure control, adjustment of the mA and/or kV according to patient size and/or use of iterative reconstruction technique. CONTRAST:  OMNIPAQUE IOHEXOL 300 MG/ML  SOLN COMPARISON:  CT abdomen pelvis 01/11/2005, x-ray pelvis 05/16/2023, x-ray pelvis 06/12/2023 FINDINGS: CHEST: Cardiovascular: No aortic injury. The thoracic aorta is normal in caliber. The heart is normal in size. No significant pericardial effusion. The main pulmonary artery is normal in caliber. No central or proximal segmental pulmonary embolus. No a calcification. Aortic valve leaflet calcifications. At least 3 vessel coronary calcification. Severe atherosclerotic plaque of the aorta. Mediastinum/Nodes: No pneumomediastinum. No mediastinal hematoma. The esophagus is unremarkable. The thyroid is unremarkable. The central airways are patent. No mediastinal, hilar, or axillary lymphadenopathy. Lungs/Pleura: No focal consolidation. No pulmonary nodule. No pulmonary mass. No pulmonary contusion or laceration. No pneumatocele formation. No pleural effusion. No pneumothorax. No hemothorax. Musculoskeletal/Chest wall: No chest wall mass. Old healed right rib fractures. No acute rib or sternal fracture. No spinal fracture. No acute fracture of the partially visualized upper extremities with motion artifact noted on this CT and correlated on the scout. ABDOMEN / PELVIS: Hepatobiliary: Not enlarged. No focal lesion. No  laceration or subcapsular hematoma. The gallbladder is otherwise unremarkable with no radio-opaque gallstones. No biliary ductal dilatation. Pancreas: Normal pancreatic contour. No main pancreatic duct dilatation. Spleen: Not enlarged. No focal lesion. No laceration, subcapsular hematoma, or vascular injury. Adrenals/Urinary Tract: No nodularity bilaterally. Bilateral kidneys enhance symmetrically. No hydronephrosis. No contusion, laceration, or subcapsular hematoma. No injury to the vascular structures or collecting systems. No hydroureter. The urinary bladder is unremarkable. Stomach/Bowel: No small or large bowel wall thickening or dilatation. The appendix is unremarkable. Vasculature/Lymphatics: Severe atherosclerotic plaque no abdominal aorta or iliac aneurysm. No active contrast extravasation or pseudoaneurysm. No abdominal, pelvic, inguinal lymphadenopathy. Reproductive: Uterus and bilateral adnexal regions are unremarkable. Other: Trace high density free fluid along the left space of Retzius. No simple free fluid ascites. No pneumoperitoneum. No hemoperitoneum. No mesenteric hematoma identified. No organized fluid collection. Musculoskeletal: No significant soft tissue hematoma. Bilateral sacroiliac joint sclerosis. Acute displaced left superior pubic rami fracture. Acute nondisplaced left inferior pubic rami fracture. No spinal fracture. Grade 1 anterolisthesis of L4 on L5. Ports and Devices: None. IMPRESSION: 1. Acute displaced left superior pubic rami fracture. Acute nondisplaced left inferior pubic rami fracture. 2. Circumferential urinary bladder wall thickening as well as trace blood product along the left space of Retzius. Extraperitoneal as well as intraperitoneal urinary bladder injury not excluded. Correlate with urinalysis and consider cystogram for further evaluation if clinically indicated. 3. No acute intrathoracic or intra-abdominal traumatic injury. 4. No acute fracture or traumatic  malalignment of the thoracic or lumbar spine. 5. Aortic Atherosclerosis (ICD10-I70.0) including mitral annular, coronary artery, and aortic valve leaflet calcifications-correlate for aortic stenosis. Electronically Signed   By: Tish Frederickson M.D.   On: 06/12/2023 22:39   DG Hip Unilat With Pelvis 2-3 Views Left  Result Date: 06/12/2023 CLINICAL DATA:  Status post fall. Rotated appearance of the left proximal femur on pelvis radiographs EXAM: DG HIP (WITH OR WITHOUT PELVIS) 3V LEFT COMPARISON:  Same-day pelvis radiograph FINDINGS: Again seen is displaced fracture of the parasymphyseal left pubic ramus. Questionable nondisplaced fracture through the left inferior pubic ramus. No acute fracture or dislocation of the left proximal femur. There is no evidence of arthropathy or other focal bone abnormality. IMPRESSION: 1. Displaced fracture of the parasymphyseal left pubic ramus. Questionable nondisplaced fracture through the left inferior pubic ramus. 2. No acute fracture or dislocation of the left proximal femur. Electronically Signed   By: Agustin Cree M.D.   On: 06/12/2023 20:39   DG Pelvis Portable  Result Date: 06/12/2023 CLINICAL DATA:  Unwitnessed fall. EXAM: PORTABLE PELVIS 1-2 VIEWS COMPARISON:  May 16, 2023. FINDINGS: Minimally displaced fracture is seen involving the left superior pubic ramus. Severe degenerative changes are seen involving the sacroiliac joints. Left proximal femur somewhat rotated. IMPRESSION: Minimally displaced fracture involving the left superior pubic ramus. Left proximal femur is somewhat rotated; radiographs of left hip are recommended for further evaluation. Electronically Signed   By: Lupita Raider M.D.   On: 06/12/2023 18:56   DG Chest Port 1 View  Result Date: 06/12/2023 CLINICAL DATA:  Unwitnessed fall. EXAM: PORTABLE CHEST 1 VIEW COMPARISON:  May 16, 2023. FINDINGS: The heart size and mediastinal contours are within normal limits. Both lungs are clear. Old  right rib fractures are noted. IMPRESSION: No active disease. Electronically Signed   By: Lupita Raider M.D.   On: 06/12/2023 18:53   CT Head Wo Contrast  Result Date: 06/12/2023 CLINICAL DATA:  Unwitnessed fall, head and neck trauma EXAM: CT HEAD WITHOUT CONTRAST CT CERVICAL SPINE WITHOUT CONTRAST TECHNIQUE: Multidetector CT imaging of the head and cervical spine was performed following the standard protocol without intravenous contrast. Multiplanar CT image reconstructions of the cervical spine were also generated. RADIATION DOSE REDUCTION: This exam was performed according to the departmental dose-optimization program which includes automated exposure control, adjustment of the mA and/or kV according to patient size and/or use of iterative reconstruction technique. COMPARISON:  05/16/2023 FINDINGS: CT HEAD FINDINGS Brain: Acute subdural hemorrhage overlying the left cerebral convexity, which measures up to 6 mm (series 6, image 38). There is likely some subarachnoid hemorrhage in the adjacent left temporal and frontal lobes (series 3, image 14). No evidence of acute infarct, parenchymal hemorrhage, mass, mass effect, or midline shift. No hydrocephalus. Age related cerebral volume loss. Vascular: No hyperdense vessel. Skull: Evaluation is somewhat limited by motion. Within this limitation, negative for fracture or focal lesion. Left parietal scalp hematoma. Sinuses/Orbits: No acute finding. CT CERVICAL SPINE FINDINGS Alignment: No traumatic listhesis. Straightening and mild reversal of the normal cervical lordosis, with trace retrolisthesis of C5 on C6. Skull base and vertebrae: No acute fracture. No primary bone lesion or focal pathologic process. Soft tissues and spinal canal: No prevertebral fluid or swelling. No visible canal hematoma. Disc levels: Degenerative changes in the cervical spine. Moderate spinal canal stenosis at C5-C6. Upper chest: Negative. IMPRESSION: 1. Acute subdural hemorrhage overlying  the left cerebral convexity, which measures up to 6 mm. There is likely some subarachnoid hemorrhage in the adjacent left temporal and frontal lobes. No midline shift. 2. Left parietal scalp hematoma. 3. No acute fracture or traumatic listhesis in the cervical spine. These results were called by telephone at the time of interpretation on 06/12/2023 at 6:22 pm to provider [facility] , who verbally acknowledged these results. Electronically Signed   By: Wiliam Ke M.D.   On: 06/12/2023 18:22   CT Cervical Spine Wo Contrast  Result  Date: 06/12/2023 CLINICAL DATA:  Unwitnessed fall, head and neck trauma EXAM: CT HEAD WITHOUT CONTRAST CT CERVICAL SPINE WITHOUT CONTRAST TECHNIQUE: Multidetector CT imaging of the head and cervical spine was performed following the standard protocol without intravenous contrast. Multiplanar CT image reconstructions of the cervical spine were also generated. RADIATION DOSE REDUCTION: This exam was performed according to the departmental dose-optimization program which includes automated exposure control, adjustment of the mA and/or kV according to patient size and/or use of iterative reconstruction technique. COMPARISON:  05/16/2023 FINDINGS: CT HEAD FINDINGS Brain: Acute subdural hemorrhage overlying the left cerebral convexity, which measures up to 6 mm (series 6, image 38). There is likely some subarachnoid hemorrhage in the adjacent left temporal and frontal lobes (series 3, image 14). No evidence of acute infarct, parenchymal hemorrhage, mass, mass effect, or midline shift. No hydrocephalus. Age related cerebral volume loss. Vascular: No hyperdense vessel. Skull: Evaluation is somewhat limited by motion. Within this limitation, negative for fracture or focal lesion. Left parietal scalp hematoma. Sinuses/Orbits: No acute finding. CT CERVICAL SPINE FINDINGS Alignment: No traumatic listhesis. Straightening and mild reversal of the normal cervical lordosis, with trace  retrolisthesis of C5 on C6. Skull base and vertebrae: No acute fracture. No primary bone lesion or focal pathologic process. Soft tissues and spinal canal: No prevertebral fluid or swelling. No visible canal hematoma. Disc levels: Degenerative changes in the cervical spine. Moderate spinal canal stenosis at C5-C6. Upper chest: Negative. IMPRESSION: 1. Acute subdural hemorrhage overlying the left cerebral convexity, which measures up to 6 mm. There is likely some subarachnoid hemorrhage in the adjacent left temporal and frontal lobes. No midline shift. 2. Left parietal scalp hematoma. 3. No acute fracture or traumatic listhesis in the cervical spine. These results were called by telephone at the time of interpretation on 06/12/2023 at 6:22 pm to provider [facility] , who verbally acknowledged these results. Electronically Signed   By: Wiliam Ke M.D.   On: 06/12/2023 18:22    Assessment/Plan 82 y/o F w/ a hx of dementia who presents after she sustained several recent falls and has a SDH and pubic rami fx  SDH  - NSGY consulted, repeat CTH in 8 hours, Keppra x 7d Pubic Rami Fx - Ortho consulted, will appreciate recs.   ?Bladder thickening  - UA unremarkable, low suspicion for injury  Dementia (POA) - Continue memantine   FEN - NPO until cleared by NSGY/Ortho VTE - Holding ID - None Admit - Medicine, Trauma will follow up  [facility] 06/13/2023, 1:21 AM Please see Amion for pager number during day hours 7:00am-4:30pm or 7:00am -11:30am on weekends

## 2023-06-13 NOTE — ED Notes (Signed)
Palliative team at bedside speaking with daughter

## 2023-06-13 NOTE — Progress Notes (Signed)
Transition of Care [facility]) - CAGE-AID Screening   Patient Details  Name: Karen [name] MRN: [id number] Date of Birth: [birth year]  [facility]) CM/SW Contact:    Leota Sauers, RN Phone Number: 06/13/2023, 6:31 AM   Clinical Narrative:  Patient denies use of alcohol and illicit substances. Resources not given at this time.  CAGE-AID Screening:    Have You Ever Felt You Ought to Cut Down on Your Drinking or Drug Use?: No Have People Annoyed You By Critizing Your Drinking Or Drug Use?: No Have You Felt Bad Or Guilty About Your Drinking Or Drug Use?: No Have You Ever Had a Drink or Used Drugs First Thing In The Morning to Steady Your Nerves or to Get Rid of a Hangover?: No CAGE-AID Score: 0  Substance Abuse Education Offered: No

## 2023-06-13 NOTE — ED Notes (Signed)
ED TO INPATIENT HANDOFF REPORT  ED Nurse Name and Phone #: Darral Dash RN [phone]  S Name/Age/Gender Karen [name] 82 y.o. female Room/Bed: 034C/034C  Code Status   Code Status: Full Code  Home/SNF/Other Nursing Home Patient oriented to: self Is this baseline?  Unable to assess   Triage Complete: Triage complete  Chief Complaint Fall, initial encounter [phone]]  Triage Note BIBA from [facility] for unwitnessed fall, unknown if hit head, no thinners. Old bruising noted to forehead, C-Collar in place. 166 cbg  Pt. Presents to the ED BIBA from [facility] for higher level of care. Pt. Presented earlier due to unwitnessed fall at facility leading to a subdural head bleed as well as a left hip fracture. Pt. Alert but disoriented at baseline. Denies thinners. VS during transfer include: BP 151/82, HR 64, 94% RA, and 18RR.    Allergies No Known Allergies  Level of Care/Admitting Diagnosis ED Disposition     ED Disposition  Admit   Condition  --   Comment  Hospital Area: [facility] [[id number]]  Level of Care: Telemetry Medical [104]  May admit patient to [facility] or [facility] if equivalent level of care is available:: No  Covid Evaluation: Asymptomatic - no recent exposure (last 10 days) testing not required  Diagnosis: Fall, initial encounter [[id number]]  Admitting Physician: Lorin Glass [[id number]]  Attending Physician: Lorin Glass [[id number]]  Certification:: I certify this patient will need inpatient services for at least 2 midnights  Expected Medical Readiness: 06/15/2023          B Medical/Surgery History 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, had mastectomy, chemo/radiation   Diabetes mellitus without complication (HCC)    High cholesterol    HTN (hypertension)    Malignant neoplasm of breast (female), unspecified site    left   Osteopenia    Sleep  apnea    uses CPAP nightly   Past Surgical History:  Procedure Laterality Date   BREAST LUMPECTOMY Right 1990's   BREAST LUMPECTOMY WITH RADIOACTIVE SEED LOCALIZATION Left 12/29/2014   Procedure: LEFT BREAST LUMPECTOMY WITH RADIOACTIVE SEED LOCALIZATION;  Surgeon: Claud Kelp, MD;  Location: [facility];  Service: General;  Laterality: Left;   CATARACT EXTRACTION  07/07/14   left eye   COMBINED LAPAROSCOPY W/ HYSTEROSCOPY     MASTECTOMY Right 02/2014   TOTAL MASTECTOMY Right 02/09/2014   Procedure: RIGHT TOTAL MASTECTOMY;  Surgeon: Ernestene Mention, MD;  Location: [facility] OR;  Service: General;  Laterality: Right;   TUBAL LIGATION       A IV Location/Drains/Wounds Patient Lines/Drains/Airways Status     Active Line/Drains/Airways     Name Placement date Placement time Site Days   Peripheral IV 06/12/23 20 G Anterior;Proximal;Right Forearm 06/12/23  1847  Forearm  1            Intake/Output Last 24 hours  Intake/Output Summary (Last 24 hours) at 06/13/2023 1602 Last data filed at 06/12/2023 2011 Gross per 24 hour  Intake 100 ml  Output --  Net 100 ml    Labs/Imaging Results for orders placed or performed during the hospital encounter of 06/12/23 (from the past 48 hour(s))  Comprehensive metabolic panel     Status: Abnormal   Collection Time: 06/12/23  6:38 PM  Result Value Ref Range   Sodium 141 135 - 145 mmol/L  Potassium 4.0 3.5 - 5.1 mmol/L   Chloride 106 98 - 111 mmol/L   CO2 28 22 - 32 mmol/L   Glucose, Bld 111 (H) 70 - 99 mg/dL    Comment: Glucose reference range applies only to samples taken after fasting for at least 8 hours.   BUN 57 (H) 8 - 23 mg/dL   Creatinine, Ser 1.61 (H) 0.44 - 1.00 mg/dL   Calcium 9.1 8.9 - 09.6 mg/dL   Total Protein 7.6 6.5 - 8.1 g/dL   Albumin 4.2 3.5 - 5.0 g/dL   AST 37 15 - 41 U/L   ALT 19 0 - 44 U/L   Alkaline Phosphatase 52 38 - 126 U/L   Total Bilirubin 0.5 <1.2 mg/dL   GFR, Estimated 52 (L) >60 mL/min     Comment: (NOTE) Calculated using the CKD-EPI Creatinine Equation (2021)    Anion gap 7 5 - 15    Comment: Performed at [facility] [place] [facility], [address], [place], [place] [address]  CBC with Differential     Status: Abnormal   Collection Time: 06/12/23  6:38 PM  Result Value Ref Range   WBC 5.8 4.0 - 10.5 K/uL   RBC 3.87 3.87 - 5.11 MIL/uL   Hemoglobin 11.7 (L) 12.0 - 15.0 g/dL   HCT 98.1 19.1 - 47.8 %   MCV 94.3 80.0 - 100.0 fL   MCH 30.2 26.0 - 34.0 pg   MCHC 32.1 30.0 - 36.0 g/dL   RDW 29.5 62.1 - 30.8 %   Platelets 132 (L) 150 - 400 K/uL   nRBC 0.0 0.0 - 0.2 %   Neutrophils Relative % 74 %   Neutro Abs 4.3 1.7 - 7.7 K/uL   Lymphocytes Relative 15 %   Lymphs Abs 0.9 0.7 - 4.0 K/uL   Monocytes Relative 8 %   Monocytes Absolute 0.5 0.1 - 1.0 K/uL   Eosinophils Relative 1 %   Eosinophils Absolute 0.1 0.0 - 0.5 K/uL   Basophils Relative 1 %   Basophils Absolute 0.0 0.0 - 0.1 K/uL   Immature Granulocytes 1 %   Abs Immature Granulocytes 0.03 0.00 - 0.07 K/uL    Comment: Performed at [facility], [address] [place], [place] [address]  Urinalysis, w/ Reflex to Culture (Infection Suspected) -Urine, Clean Catch     Status: Abnormal   Collection Time: 06/12/23  7:21 PM  Result Value Ref Range   Specimen Source URINE, CLEAN CATCH    Color, Urine YELLOW YELLOW   APPearance CLEAR CLEAR   Specific Gravity, Urine 1.023 1.005 - 1.030   pH 6.0 5.0 - 8.0   Glucose, UA NEGATIVE NEGATIVE mg/dL   Hgb urine dipstick NEGATIVE NEGATIVE   Bilirubin Urine NEGATIVE NEGATIVE   Ketones, ur NEGATIVE NEGATIVE mg/dL   Protein, ur 30 (A) NEGATIVE mg/dL   Nitrite NEGATIVE NEGATIVE   Leukocytes,Ua NEGATIVE NEGATIVE   RBC / HPF 0-5 0 - 5 RBC/hpf   WBC, UA 0-5 0 - 5 WBC/hpf    Comment:        Reflex urine culture not performed if WBC <=10, OR if Squamous epithelial cells >5. If Squamous epithelial cells >5 suggest recollection.    Bacteria, UA NONE SEEN NONE  SEEN   Squamous Epithelial / HPF 0-5 0 - 5 /HPF   Mucus PRESENT     Comment: Performed at [facility], [address], [place], [place] [address]  CBG monitoring, ED  Status: None   Collection Time: 06/13/23  8:10 AM  Result Value Ref Range   Glucose-Capillary 94 70 - 99 mg/dL    Comment: Glucose reference range applies only to samples taken after fasting for at least 8 hours.  CBG monitoring, ED     Status: Abnormal   Collection Time: 06/13/23 12:07 PM  Result Value Ref Range   Glucose-Capillary 103 (H) 70 - 99 mg/dL    Comment: Glucose reference range applies only to samples taken after fasting for at least 8 hours.   CT HEAD WO CONTRAST ( )  Result Date: 06/13/2023 CLINICAL DATA:  Subdural hematoma follow-up EXAM: CT HEAD WITHOUT CONTRAST TECHNIQUE: Contiguous axial images were obtained from the base of the skull through the vertex without intravenous contrast. RADIATION DOSE REDUCTION: This exam was performed according to the departmental dose-optimization program which includes automated exposure control, adjustment of the mA and/or kV according to patient size and/or use of iterative reconstruction technique. COMPARISON:  06/12/2023 FINDINGS: Brain: Unchanged appearance of small left convexity subdural hematoma allowing for redistribution. Persistent subarachnoid blood over the left parietal lobe and in the left lateral ventricle. No new site of hemorrhage. Size and configuration ventricles are unchanged. Vascular: There is atherosclerotic calcification of both internal carotid arteries at the skull base. Skull: Left parietal scalp hematoma Sinuses/Orbits: Paranasal sinuses are clear. No mastoid effusion. Normal orbits. Other: None IMPRESSION: 1. Unchanged appearance of small left convexity subdural hematoma allowing for redistribution. 2. Persistent subarachnoid blood over the left parietal lobe and in the left lateral ventricle. 3. No new site of hemorrhage.  Electronically Signed   By: Deatra Robinson M.D.   On: 06/13/2023 02:22   CT CHEST ABDOMEN PELVIS W CONTRAST  Result Date: 06/12/2023 CLINICAL DATA:  Polytrauma, blunt or unwitnessed fall, unknown if hit head, no thinners. Old bruising noted to forehead, C-Collar in place. EXAM: CT CHEST, ABDOMEN, AND PELVIS WITH CONTRAST TECHNIQUE: Multidetector CT imaging of the chest, abdomen and pelvis was performed following the standard protocol during bolus administration of intravenous contrast. RADIATION DOSE REDUCTION: This exam was performed according to the departmental dose-optimization program which includes automated exposure control, adjustment of the mA and/or kV according to patient size and/or use of iterative reconstruction technique. CONTRAST:  OMNIPAQUE IOHEXOL 300 MG/ML  SOLN COMPARISON:  CT abdomen pelvis 01/11/2005, x-ray pelvis 05/16/2023, x-ray pelvis 06/12/2023 FINDINGS: CHEST: Cardiovascular: No aortic injury. The thoracic aorta is normal in caliber. The heart is normal in size. No significant pericardial effusion. The main pulmonary artery is normal in caliber. No central or proximal segmental pulmonary embolus. No a calcification. Aortic valve leaflet calcifications. At least 3 vessel coronary calcification. Severe atherosclerotic plaque of the aorta. Mediastinum/Nodes: No pneumomediastinum. No mediastinal hematoma. The esophagus is unremarkable. The thyroid is unremarkable. The central airways are patent. No mediastinal, hilar, or axillary lymphadenopathy. Lungs/Pleura: No focal consolidation. No pulmonary nodule. No pulmonary mass. No pulmonary contusion or laceration. No pneumatocele formation. No pleural effusion. No pneumothorax. No hemothorax. Musculoskeletal/Chest wall: No chest wall mass. Old healed right rib fractures. No acute rib or sternal fracture. No spinal fracture. No acute fracture of the partially visualized upper extremities with motion artifact noted on this CT and  correlated on the scout. ABDOMEN / PELVIS: Hepatobiliary: Not enlarged. No focal lesion. No laceration or subcapsular hematoma. The gallbladder is otherwise unremarkable with no radio-opaque gallstones. No biliary ductal dilatation. Pancreas: Normal pancreatic contour. No main pancreatic duct dilatation. Spleen: Not enlarged. No focal lesion. No laceration, subcapsular  hematoma, or vascular injury. Adrenals/Urinary Tract: No nodularity bilaterally. Bilateral kidneys enhance symmetrically. No hydronephrosis. No contusion, laceration, or subcapsular hematoma. No injury to the vascular structures or collecting systems. No hydroureter. The urinary bladder is unremarkable. Stomach/Bowel: No small or large bowel wall thickening or dilatation. The appendix is unremarkable. Vasculature/Lymphatics: Severe atherosclerotic plaque no abdominal aorta or iliac aneurysm. No active contrast extravasation or pseudoaneurysm. No abdominal, pelvic, inguinal lymphadenopathy. Reproductive: Uterus and bilateral adnexal regions are unremarkable. Other: Trace high density free fluid along the left space of Retzius. No simple free fluid ascites. No pneumoperitoneum. No hemoperitoneum. No mesenteric hematoma identified. No organized fluid collection. Musculoskeletal: No significant soft tissue hematoma. Bilateral sacroiliac joint sclerosis. Acute displaced left superior pubic rami fracture. Acute nondisplaced left inferior pubic rami fracture. No spinal fracture. Grade 1 anterolisthesis of L4 on L5. Ports and Devices: None. IMPRESSION: 1. Acute displaced left superior pubic rami fracture. Acute nondisplaced left inferior pubic rami fracture. 2. Circumferential urinary bladder wall thickening as well as trace blood product along the left space of Retzius. Extraperitoneal as well as intraperitoneal urinary bladder injury not excluded. Correlate with urinalysis and consider cystogram for further evaluation if clinically indicated. 3. No acute  intrathoracic or intra-abdominal traumatic injury. 4. No acute fracture or traumatic malalignment of the thoracic or lumbar spine. 5. Aortic Atherosclerosis (ICD10-I70.0) including mitral annular, coronary artery, and aortic valve leaflet calcifications-correlate for aortic stenosis. Electronically Signed   By: Tish Frederickson M.D.   On: 06/12/2023 22:39   DG Hip Unilat With Pelvis 2-3 Views Left  Result Date: 06/12/2023 CLINICAL DATA:  Status post fall. Rotated appearance of the left proximal femur on pelvis radiographs EXAM: DG HIP (WITH OR WITHOUT PELVIS) 3V LEFT COMPARISON:  Same-day pelvis radiograph FINDINGS: Again seen is displaced fracture of the parasymphyseal left pubic ramus. Questionable nondisplaced fracture through the left inferior pubic ramus. No acute fracture or dislocation of the left proximal femur. There is no evidence of arthropathy or other focal bone abnormality. IMPRESSION: 1. Displaced fracture of the parasymphyseal left pubic ramus. Questionable nondisplaced fracture through the left inferior pubic ramus. 2. No acute fracture or dislocation of the left proximal femur. Electronically Signed   By: Agustin Cree M.D.   On: 06/12/2023 20:39   DG Pelvis Portable  Result Date: 06/12/2023 CLINICAL DATA:  Unwitnessed fall. EXAM: PORTABLE PELVIS 1-2 VIEWS COMPARISON:  May 16, 2023. FINDINGS: Minimally displaced fracture is seen involving the left superior pubic ramus. Severe degenerative changes are seen involving the sacroiliac joints. Left proximal femur somewhat rotated. IMPRESSION: Minimally displaced fracture involving the left superior pubic ramus. Left proximal femur is somewhat rotated; radiographs of left hip are recommended for further evaluation. Electronically Signed   By: Lupita Raider M.D.   On: 06/12/2023 18:56   DG Chest Port 1 View  Result Date: 06/12/2023 CLINICAL DATA:  Unwitnessed fall. EXAM: PORTABLE CHEST 1 VIEW COMPARISON:  May 16, 2023. FINDINGS: The heart  size and mediastinal contours are within normal limits. Both lungs are clear. Old right rib fractures are noted. IMPRESSION: No active disease. Electronically Signed   By: Lupita Raider M.D.   On: 06/12/2023 18:53   CT Head Wo Contrast  Result Date: 06/12/2023 CLINICAL DATA:  Unwitnessed fall, head and neck trauma EXAM: CT HEAD WITHOUT CONTRAST CT CERVICAL SPINE WITHOUT CONTRAST TECHNIQUE: Multidetector CT imaging of the head and cervical spine was performed following the standard protocol without intravenous contrast. Multiplanar CT image reconstructions of the cervical spine were  also generated. RADIATION DOSE REDUCTION: This exam was performed according to the departmental dose-optimization program which includes automated exposure control, adjustment of the mA and/or kV according to patient size and/or use of iterative reconstruction technique. COMPARISON:  05/16/2023 FINDINGS: CT HEAD FINDINGS Brain: Acute subdural hemorrhage overlying the left cerebral convexity, which measures up to 6 mm (series 6, image 38). There is likely some subarachnoid hemorrhage in the adjacent left temporal and frontal lobes (series 3, image 14). No evidence of acute infarct, parenchymal hemorrhage, mass, mass effect, or midline shift. No hydrocephalus. Age related cerebral volume loss. Vascular: No hyperdense vessel. Skull: Evaluation is somewhat limited by motion. Within this limitation, negative for fracture or focal lesion. Left parietal scalp hematoma. Sinuses/Orbits: No acute finding. CT CERVICAL SPINE FINDINGS Alignment: No traumatic listhesis. Straightening and mild reversal of the normal cervical lordosis, with trace retrolisthesis of C5 on C6. Skull base and vertebrae: No acute fracture. No primary bone lesion or focal pathologic process. Soft tissues and spinal canal: No prevertebral fluid or swelling. No visible canal hematoma. Disc levels: Degenerative changes in the cervical spine. Moderate spinal canal stenosis  at C5-C6. Upper chest: Negative. IMPRESSION: 1. Acute subdural hemorrhage overlying the left cerebral convexity, which measures up to 6 mm. There is likely some subarachnoid hemorrhage in the adjacent left temporal and frontal lobes. No midline shift. 2. Left parietal scalp hematoma. 3. No acute fracture or traumatic listhesis in the cervical spine. These results were called by telephone at the time of interpretation on 06/12/2023 at 6:22 pm to provider [facility] , who verbally acknowledged these results. Electronically Signed   By: Wiliam Ke M.D.   On: 06/12/2023 18:22   CT Cervical Spine Wo Contrast  Result Date: 06/12/2023 CLINICAL DATA:  Unwitnessed fall, head and neck trauma EXAM: CT HEAD WITHOUT CONTRAST CT CERVICAL SPINE WITHOUT CONTRAST TECHNIQUE: Multidetector CT imaging of the head and cervical spine was performed following the standard protocol without intravenous contrast. Multiplanar CT image reconstructions of the cervical spine were also generated. RADIATION DOSE REDUCTION: This exam was performed according to the departmental dose-optimization program which includes automated exposure control, adjustment of the mA and/or kV according to patient size and/or use of iterative reconstruction technique. COMPARISON:  05/16/2023 FINDINGS: CT HEAD FINDINGS Brain: Acute subdural hemorrhage overlying the left cerebral convexity, which measures up to 6 mm (series 6, image 38). There is likely some subarachnoid hemorrhage in the adjacent left temporal and frontal lobes (series 3, image 14). No evidence of acute infarct, parenchymal hemorrhage, mass, mass effect, or midline shift. No hydrocephalus. Age related cerebral volume loss. Vascular: No hyperdense vessel. Skull: Evaluation is somewhat limited by motion. Within this limitation, negative for fracture or focal lesion. Left parietal scalp hematoma. Sinuses/Orbits: No acute finding. CT CERVICAL SPINE FINDINGS Alignment: No traumatic listhesis.  Straightening and mild reversal of the normal cervical lordosis, with trace retrolisthesis of C5 on C6. Skull base and vertebrae: No acute fracture. No primary bone lesion or focal pathologic process. Soft tissues and spinal canal: No prevertebral fluid or swelling. No visible canal hematoma. Disc levels: Degenerative changes in the cervical spine. Moderate spinal canal stenosis at C5-C6. Upper chest: Negative. IMPRESSION: 1. Acute subdural hemorrhage overlying the left cerebral convexity, which measures up to 6 mm. There is likely some subarachnoid hemorrhage in the adjacent left temporal and frontal lobes. No midline shift. 2. Left parietal scalp hematoma. 3. No acute fracture or traumatic listhesis in the cervical spine. These results were called by telephone  at the time of interpretation on 06/12/2023 at 6:22 pm to provider [facility] , who verbally acknowledged these results. Electronically Signed   By: Wiliam Ke M.D.   On: 06/12/2023 18:22    Pending Labs Unresulted Labs (From admission, onward)     Start     Ordered   06/14/23 0500  Basic metabolic panel  Tomorrow morning,   R        06/13/23 0729   06/14/23 0500  CBC  Tomorrow morning,   R        06/13/23 0729   06/13/23 0729  Hemoglobin A1c  Add-on,   AD       Comments: To assess prior glycemic control    06/13/23 0729            Vitals/Pain Today's Vitals   06/13/23 1322 06/13/23 1400 06/13/23 1500 06/13/23 1600  BP:  134/62 (!) 112/46 (!) 118/58  Pulse:  68 63 65  Resp:  16 14 13   Temp: 98.3 F (36.8 C)     TempSrc:      SpO2:  95% 100% 100%  PainSc:        Isolation Precautions No active isolations  Medications Medications  insulin aspart (novoLOG) injection 0-9 Units ( Subcutaneous Not Given 06/13/23 1209)  insulin aspart (novoLOG) injection 0-5 Units (has no administration in time range)  acetaminophen (TYLENOL) tablet 650 mg (has no administration in time range)    Or  acetaminophen (TYLENOL)  suppository 650 mg (has no administration in time range)  albuterol (PROVENTIL) (2.5 MG/3ML) 0.083% nebulizer solution 2.5 mg (has no administration in time range)  hydrALAZINE (APRESOLINE) injection 10 mg (has no administration in time range)  oxyCODONE (Oxy IR/ROXICODONE) immediate release tablet 5 mg (has no administration in time range)  polyethylene glycol (MIRALAX / GLYCOLAX) packet 17 g (has no administration in time range)  levETIRAcetam (KEPPRA) IVPB 1500 mg/ 100 mL premix (0 mg Intravenous Stopped 06/12/23 2011)  iohexol (OMNIPAQUE) 300 MG/ML solution 100 mL (100 mLs Intravenous Contrast Given 06/12/23 2142)    Mobility non-ambulatory     Focused Assessments Neuro Assessment Handoff:  Swallow screen pass? N/A Cardiac Rhythm: Normal sinus rhythm       Neuro Assessment: Exceptions to [facility] Neuro Checks:      Has TPA been given? No If patient is a Neuro Trauma and patient is going to OR before floor call report to 4N Charge nurse: [phone] or [phone]   R Recommendations: See Admitting Provider Note  Report given to:   Additional Notes:

## 2023-06-14 DIAGNOSIS — Z515 Encounter for palliative care: Secondary | ICD-10-CM | POA: Diagnosis not present

## 2023-06-14 DIAGNOSIS — Z7189 Other specified counseling: Secondary | ICD-10-CM | POA: Diagnosis not present

## 2023-06-14 DIAGNOSIS — W19XXXA Unspecified fall, initial encounter: Secondary | ICD-10-CM

## 2023-06-14 DIAGNOSIS — S32592A Other specified fracture of left pubis, initial encounter for closed fracture: Secondary | ICD-10-CM | POA: Diagnosis not present

## 2023-06-14 DIAGNOSIS — Z66 Do not resuscitate: Secondary | ICD-10-CM | POA: Diagnosis not present

## 2023-06-14 DIAGNOSIS — G309 Alzheimer's disease, unspecified: Secondary | ICD-10-CM

## 2023-06-14 DIAGNOSIS — F02C0 Dementia in other diseases classified elsewhere, severe, without behavioral disturbance, psychotic disturbance, mood disturbance, and anxiety: Secondary | ICD-10-CM

## 2023-06-14 DIAGNOSIS — S065XAA Traumatic subdural hemorrhage with loss of consciousness status unknown, initial encounter: Secondary | ICD-10-CM | POA: Diagnosis not present

## 2023-06-14 DIAGNOSIS — E119 Type 2 diabetes mellitus without complications: Secondary | ICD-10-CM

## 2023-06-14 DIAGNOSIS — I10 Essential (primary) hypertension: Secondary | ICD-10-CM | POA: Diagnosis present

## 2023-06-14 LAB — BASIC METABOLIC PANEL
Anion gap: 11 (ref 5–15)
BUN: 44 mg/dL — ABNORMAL HIGH (ref 8–23)
CO2: 28 mmol/L (ref 22–32)
Calcium: 9.1 mg/dL (ref 8.9–10.3)
Chloride: 105 mmol/L (ref 98–111)
Creatinine, Ser: 0.97 mg/dL (ref 0.44–1.00)
GFR, Estimated: 58 mL/min — ABNORMAL LOW (ref >60)
Glucose, Bld: 105 mg/dL — ABNORMAL HIGH (ref 70–99)
Potassium: 3.5 mmol/L (ref 3.5–5.1)
Sodium: 144 mmol/L (ref 135–145)

## 2023-06-14 LAB — GLUCOSE, CAPILLARY
Glucose-Capillary: 85 mg/dL (ref 70–99)
Glucose-Capillary: 81 mg/dL (ref 70–99)
Glucose-Capillary: 196 mg/dL — ABNORMAL HIGH (ref 70–99)
Glucose-Capillary: 173 mg/dL — ABNORMAL HIGH (ref 70–99)

## 2023-06-14 LAB — CBC
HCT: 36.2 % (ref 36.0–46.0)
Hemoglobin: 11.3 g/dL — ABNORMAL LOW (ref 12.0–15.0)
MCH: 28.5 pg (ref 26.0–34.0)
MCHC: 31.2 g/dL (ref 30.0–36.0)
MCV: 91.2 fL (ref 80.0–100.0)
Platelets: 122 10*3/uL — ABNORMAL LOW (ref 150–400)
RBC: 3.97 MIL/uL (ref 3.87–5.11)
RDW: 12.9 % (ref 11.5–15.5)
WBC: 4.7 10*3/uL (ref 4.0–10.5)
nRBC: 0 % (ref 0.0–0.2)

## 2023-06-14 MED ORDER — MIRABEGRON ER 25 MG PO TB24
25.0000 mg | ORAL_TABLET | Freq: Every day | ORAL | Status: DC
  Administered 2023-06-15 – 2023-06-16 (×2): 25 mg via ORAL
  Filled 2023-06-14 (×2): qty 1

## 2023-06-14 MED ORDER — MIRABEGRON ER 8 MG/ML PO SRER: 3.0000 mL | Freq: Every day | ORAL | Status: DC

## 2023-06-14 MED ORDER — ALPRAZOLAM 0.5 MG PO TABS: 0.5000 mg | ORAL_TABLET | Freq: Two times a day (BID) | ORAL | Status: DC | PRN

## 2023-06-14 MED ORDER — LEVETIRACETAM 500 MG PO TABS
500.0000 mg | ORAL_TABLET | Freq: Two times a day (BID) | ORAL | Status: DC
  Administered 2023-06-14 – 2023-06-16 (×5): 500 mg via ORAL
  Filled 2023-06-14 (×5): qty 1

## 2023-06-14 MED ORDER — IRBESARTAN 150 MG PO TABS
150.0000 mg | ORAL_TABLET | Freq: Every day | ORAL | Status: DC
  Administered 2023-06-14 – 2023-06-16 (×3): 150 mg via ORAL
  Filled 2023-06-14 (×3): qty 1

## 2023-06-14 MED ORDER — SIMVASTATIN 20 MG PO TABS
40.0000 mg | ORAL_TABLET | Freq: Every day | ORAL | Status: DC
Start: 2023-06-14 — End: 2023-06-16
  Administered 2023-06-14 – 2023-06-16 (×3): 40 mg via ORAL
  Filled 2023-06-14 (×3): qty 2

## 2023-06-14 MED ORDER — TACROLIMUS 0.03 % EX OINT
1.0000 | TOPICAL_OINTMENT | Freq: Two times a day (BID) | CUTANEOUS | Status: DC
Start: 2023-06-14 — End: 2023-06-14

## 2023-06-14 NOTE — Progress Notes (Addendum)
1000 patient not eating unable to get her to do a swallow or take medications. Patient doesn't follow and commands will nod and say yes and no at times.  1300 patient did eat magic cup and peach cobbler for lunch total assist with that meds were given

## 2023-06-14 NOTE — Progress Notes (Signed)
Daily Progress Note   Patient Name: Karen [name]       Date: 06/14/2023 DOB: 1940-08-25  Age: 82 y.o. MRN#: [id number] Attending Physician: Cathren Harsh, MD Primary Care Physician: Garlan Fillers, MD Admit Date: 06/12/2023 Length of Stay: 1 day  Reason for Consultation/Follow-up: Establishing goals of care  HPI/Patient Profile:  82 y.o. female  with past medical history of dementia, DM2, HTN, HLD, OSA on CPAP, breast cancer s/p mastectomy, chemo, radiation, anxiety, arthritis. She is a long-term resident at the memory care unit at [facility]  At baseline, patient is demented, able to have some conversation, supposed to walk with a walker but refuses. On 12/3, patient had an unwitnessed fall, unknown if she hit her head and she was brought to the emergency room for evaluation.  She was found to have subdural hemorrhage approximately 6 mm and was transferred to [facility].  She was admitted on 06/12/2023 with acute SDH, possible SAH, acute metabolic encephalopathy with baseline dementia, pelvic rami fracture, and others.    Palliative medicine was consulted for GOC conversations.  Subjective:   Subjective: Chart Reviewed. Updates received. Patient Assessed. Created space and opportunity for patient  and family to explore thoughts and feelings regarding current medical situation.  Today's Discussion: Today came to the patient's room and saw the patient at bedside.  However, she is unable to meaningfully communicate.  She is laying in the bed in the fetal position and not interacting.  Prior to entering the room I spoke with the patient's nurse and she states that the patient is not eating at all.  They have tried to encourage her repeatedly, including her family.  They even tried applying some orange juice to her lips to see if this would trigger her to drink.  However, she is not eating or drinking at this time.  At the bedside with the patient's son Kateleigh Gallia ("Emmer Lowmaster")  who is [place], son Hokulani Mahady.  Daughter Carmelina Noun is participating via speaker phone.  First we addressed their understanding of the patient's illness.  Family shared that the patient has been living in [facility] memory care and falling a lot, 4-5 times recently.  The falls have typically been backward where she bumped her head at which point the facility sent her out for a CT exam which is typically okay and they bring her back to memory care.  This time she was found in her room in a center for CT scan but noted a brain bleed and recommended sending her to the hospital.  Today they have also noticed knee bruising, likely delayed bruising from the fall.  They also note that she has hip fractures.  We discussed that orthopedics has recommended weightbearing as tolerated, no surgery, outpatient follow-up in 3 weeks if desired.   Next we discussed the patient's life.  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  She was always very active and loves to dance.  Even with her advanced Alzheimer's, typically she would have a fall because she was trying to dance with music playing overhead.  Her husband passed away from Alzheimer's and Doreatha Martin disease in 2017.  They had a extensive caregiver relationship with her father which he describes hard.  Things that brought her happiness was the [place] which she missed.  The patient, her husband, and all her children were born in [place]  She enjoyed dancing and playing PNO.  She was described as a strong woman who overcame breast cancer twice with chemotherapy and radiation.  Next we discussed things of importance which include family, being active and functional, and faith.  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  Finally we had further discussion on extensive and various  topics.  We spent an extensive amount of time exploring options, possibilities, trajectory of Alzheimer's disease.  One element we did discuss his CODE STATUS.  After in-depth discussion they have elected to transition to DNR.  After quite a lengthy and in-depth discussion they have decided to take this evening to speak with her siblings.  However, they would like to schedule an appointment for a hospice liaison to come and explained their services to them.  They are familiar with hospice with their experience with their father.  But they would like to discuss.  Anticipate if they elect hospice care that they would like hospice in place at [facility] facility where she normally lives in memory care.  I offered choice and explained various hospice agencies.  They have had personal experience with their father with hospice of the [place] and have requested to speak with them.  I shared the palliative medicine would be available to follow-up and support them in their further discussions as needed.  I provided contact cards with palliative medicine contact information.  I provided emotional and general support through therapeutic listening, empathy, sharing of stories, therapeutic touch, and other techniques. I answered all questions and addressed all concerns to the best of my ability.  Review of Systems  Unable to perform ROS: Patient nonverbal    Objective:   Vital Signs:  BP (!) 149/71 (BP Location: Left Arm)   Pulse (!) 57   Temp 97.6 F (36.4 C)   Resp 16   Ht 4\' 10"  (1.473 m)   Wt 36 kg   SpO2 97%   BMI 16.59 kg/m   Physical Exam Vitals and nursing note reviewed.  Constitutional:      General: She is sleeping. She is not in acute distress. HENT:     Head: Normocephalic and atraumatic.  Pulmonary:     Effort: Pulmonary effort is normal. No respiratory distress.  Abdominal:     General: Abdomen is flat.  Neurological:     Comments: Nonverbal, not waking up    Palliative  Assessment/Data: 10%    Existing Vynca/ACP Documentation: Advanced directive signed 11/22/2006  Assessment & Plan:   Impression: Present on Admission:  Fall, initial encounter  Subdural hematoma (HCC)  Dementia (HCC)  Hyperlipidemia  Closed fracture of multiple pubic rami, left, initial encounter (HCC)  Ductal carcinoma in situ (DCIS) of right breast  Essential hypertension  82 year old female with acute presentation of chronic comorbidities as described above. The patient is suffered a fall with pelvic rami fracture x 2 and SDH/possible SAH. We discussed the patient's normal baseline and she appears to be worse than normal, likely due to intracranial bleed. This is then also in the setting of an overall slow decline over time given Alzheimer's disease.  After an extensive family discussion they have elected to request hospice evaluation tomorrow at 2:00 at the bedside.  Will continue current scope of care for now.  We have changed her to DNR-comfort per family request.  Overall prognosis very poor.  SUMMARY OF RECOMMENDATIONS   Changed to DNR-comfort Continue current scope of care [facility] consult for hospice referral  Spoke with hospice liaison Norm Parcel who will plan to see the patient's family in person at the bedside 2:00 PM tomorrow Anticipate possible discharge to hospice in residence at [facility] facility Palliative medicine will continue to follow  Symptom Management:  Per primary team PMT is available to assist as needed  Code Status: DNR-comfort  Prognosis: < 2 weeks  Discharge Planning: To Be Determined  Discussed with: Patient's family, medical team, nursing team, hospice liaison, [facility] team  Thank you for allowing [place] to participate in the care of BAYLEI SPEAKES PMT will continue to support holistically.  Time Total: 120 min  Detailed review of medical records (labs, imaging, vital signs), medically appropriate exam, discussed with treatment team, counseling and  education to patient, family, & staff, documenting clinical information, medication management, coordination of care  Wynne Dust, NP Palliative Medicine Team  Team Phone # [phone] (Nights/Weekends)  03/08/2021, 8:17 AM

## 2023-06-14 NOTE — Plan of Care (Signed)

## 2023-06-14 NOTE — Progress Notes (Signed)
This chaplain is responding to PMT NP-Eric consult for spiritual care. The chaplain phoned the Pt. HCPOA-Joel for more information about the consult.   The chaplain understands the Pt. HCPOA-Joel is requesting a Catholic priest visit for [employer] [facility]. Joel's preference is to be at the bedside at the time of the visit. Francis Dowse plans to arrive at 2pm on Friday and remain throughout the afternoon.  The chaplain left a VM at [facility]. Renae Fickle the [facility] for a return call to schedule a time to visit.    This chaplain will update the PMT and Spiritual Care Department.   Theodosia Paling [phone]

## 2023-06-14 NOTE — Progress Notes (Signed)
[facility]                                                                              Karen [name], is a 82 y.o. female, DOB - [birth year], XBJ:[id number] Admit date - 06/12/2023    Outpatient Primary MD for the patient is [name], [name], MD  LOS - 1  days  Chief Complaint  Patient presents with   Fall       Brief summary   Patient is a 82 year old female with dementia, diabetes mellitus type 2, HTN, hyperlipidemia, OSA on CPAP, breast CA status postmastectomy, chemo, radiation, anxiety, arthritis, long-term resident at the memory care unit at [facility].  At baseline patient has dementia, able to have some conversation, supposed to walk with a walker but refuses.  On 12/3, patient had an unwitnessed fall unknown if she hit her head.  Patient was brought to [facility] ED and c-collar was placed.  Not on any anticoagulation. Workup revealed acute left SDH/SAH without significant mass effect.   EDP discussed with neurosurgery and was recommended repeat CT head in 8 hours, Keppra for 7 days and BP control. Patient was also seen by surgery/trauma service who recommended patient transfer to [facility] ED. Patient was also noted to have left pubic rami fracture and orthopedics was consulted. Patient was admitted for further workup.  Assessment & Plan    Principal Problem:   Closed fracture of multiple pubic rami, left, initial encounter (HCC) -Seen by orthopedics, recommended WBAT BLE -Follow-up with Dr. Headaches in 3 weeks -PT OT evaluation, continue pain control, bowel regimen  Active problems Acute SDH, SAH -Secondary to mechanical fall.  CT head showed acute SDH overlying the left cerebral convexity measures up to 6 mm, likely some subarachnoid hemorrhage in the HSN left temporal and frontal lobes, no midline shift. -EDP discussed the case with neurosurgery on-call Patrici Ranks, did not recommend any surgical intervention and recommended repeat CT  head in 8 hours, Keppra 500 mg twice daily for 7 days, BP monitoring less than 150 -Repeat CT head showed unchanged appearance of small SDH, persistent SAH over the left parietal lobe and in left lateral ventricle -Started Keppra   Acute metabolic encephalopathy superimposed on dementia, anxiety -At baseline has dementia but per family, able to have some conversation. -Currently alert and awake, avoid sedating meds - PTA meds-Depakote 125 mg daily, Namenda 10 mg daily, Aricept 10 mg at bedtime Zoloft 50 mg daily, Remeron 7.5 mg nightly Xanax 0.5 mg twice daily as needed, -Continue Keppra, hold mood altering medications for now given head injury.  For now continue Xanax as needed to avoid any BZD withdrawals   Abnormal imaging findings of the bladder CT pelvis suspected bladder injury but urinalysis unremarkable Trauma team aware.   Hypertension PTA meds- metoprolol 25 mg twice daily, valsartan 160 mg daily -Heart rate in 50s to 60s, holding metoprolol.   -BP controlled, resume ARB -Currently on IV hydralazine as needed for hypertension    Type 2 diabetes mellitus, NIDDM -Hemoglobin A1c 5.5 -Outpatient on metformin, currently on hold -Continue sliding scale insulin while inpatient   Hyperlipidemia Continue simvastatin  when able to   H/o breast cancer s/p mastectomy, chemo, radiation   Underweight Estimated body mass index is 16.59 kg/m as calculated from the following:   Height as of this encounter: 4\' 10"  (1.473 m).   Weight as of this encounter: 36 kg.  Code Status: Full code DVT Prophylaxis:  SCDs Start: 06/13/23 0729   Level of Care: Level of care: Telemetry Medical Family Communication: No family member at the bedside Disposition Plan:      Remains inpatient appropriate: PT OT evaluation pending   Procedures:    Consultants:   Neurosurgery Orthopedics Trauma surgery  Antimicrobials:   Anti-infectives (From admission, onward)    None           Medications  feeding supplement  237 mL Oral BID BM   influenza vaccine adjuvanted  0.5 mL Intramuscular Tomorrow-1000   insulin aspart  0-5 Units Subcutaneous QHS   insulin aspart  0-9 Units Subcutaneous TID WC      Subjective:   Karen [name] was seen and examined today.  Difficult to obtain ROS from the patient due to dementia.  Appears to be comfortable.  No acute issues overnight.  BP somewhat elevated heart rate controlled.   Objective:   Vitals:   06/13/23 1730 06/13/23 2250 06/14/23 0653 06/14/23 0727  BP: (!) 150/64 (!) 160/74 (!) 143/71 (!) 149/71  Pulse: 75 65 61 (!) 57  Resp: 18 18  16   Temp: 98.4 F (36.9 C) 98.3 F (36.8 C) 98.2 F (36.8 C) 97.6 F (36.4 C)  TempSrc: Oral Oral Axillary   SpO2: 99% 99% 96% 97%  Weight: 36 kg     Height: 4\' 10"  (1.473 m)      No intake or output data in the 24 hours ending 06/14/23 1010   Wt Readings from Last 3 Encounters:  06/13/23 36 kg  04/07/22 49.9 kg  08/11/19 49.9 kg     Exam General: Alert and awake NAD Cardiovascular: S1 S2 auscultated,  RRR Respiratory: CTAB Gastrointestinal: Soft, nontender, nondistended, + bowel sounds Ext: no pedal edema bilaterally Neuro: does not follow verbal commands Psych: dementia    Data Reviewed:  I have personally reviewed following labs    CBC Lab Results  Component Value Date   WBC 4.7 06/14/2023   RBC 3.97 06/14/2023   HGB 11.3 (L) 06/14/2023   HCT 36.2 06/14/2023   MCV 91.2 06/14/2023   MCH 28.5 06/14/2023   PLT 122 (L) 06/14/2023   MCHC 31.2 06/14/2023   RDW 12.9 06/14/2023   LYMPHSABS 0.9 06/12/2023   MONOABS 0.5 06/12/2023   EOSABS 0.1 06/12/2023   BASOSABS 0.0 06/12/2023     Last metabolic panel Lab Results  Component Value Date   NA 144 06/14/2023   K 3.5 06/14/2023   CL 105 06/14/2023   CO2 28 06/14/2023   BUN 44 (H) 06/14/2023   CREATININE 0.97 06/14/2023   GLUCOSE 105 (H) 06/14/2023   GFRNONAA 58 (L) 06/14/2023   GFRAA >60  08/11/2019   CALCIUM 9.1 06/14/2023   PROT 7.6 06/12/2023   ALBUMIN 4.2 06/12/2023   BILITOT 0.5 06/12/2023   ALKPHOS 52 06/12/2023   AST 37 06/12/2023   ALT 19 06/12/2023   ANIONGAP 11 06/14/2023    CBG (last 3)  Recent Labs    06/13/23 1731 06/13/23 2052 06/14/23 0729  GLUCAP 86 77 85      Coagulation Profile: No results for input(s): "INR", "PROTIME" in the last 168 hours.   Radiology  Studies: I have personally reviewed the imaging studies  CT HEAD WO CONTRAST ( )  Result Date: 06/13/2023 CLINICAL DATA:  Subdural hematoma follow-up EXAM: CT HEAD WITHOUT CONTRAST TECHNIQUE: Contiguous axial images were obtained from the base of the skull through the vertex without intravenous contrast. RADIATION DOSE REDUCTION: This exam was performed according to the departmental dose-optimization program which includes automated exposure control, adjustment of the mA and/or kV according to patient size and/or use of iterative reconstruction technique. COMPARISON:  06/12/2023 FINDINGS: Brain: Unchanged appearance of small left convexity subdural hematoma allowing for redistribution. Persistent subarachnoid blood over the left parietal lobe and in the left lateral ventricle. No new site of hemorrhage. Size and configuration ventricles are unchanged. Vascular: There is atherosclerotic calcification of both internal carotid arteries at the skull base. Skull: Left parietal scalp hematoma Sinuses/Orbits: Paranasal sinuses are clear. No mastoid effusion. Normal orbits. Other: None IMPRESSION: 1. Unchanged appearance of small left convexity subdural hematoma allowing for redistribution. 2. Persistent subarachnoid blood over the left parietal lobe and in the left lateral ventricle. 3. No new site of hemorrhage. Electronically Signed   By: Deatra Robinson M.D.   On: 06/13/2023 02:22   CT CHEST ABDOMEN PELVIS W CONTRAST  Result Date: 06/12/2023 CLINICAL DATA:  Polytrauma, blunt or unwitnessed fall, unknown  if hit head, no thinners. Old bruising noted to forehead, C-Collar in place. EXAM: CT CHEST, ABDOMEN, AND PELVIS WITH CONTRAST TECHNIQUE: Multidetector CT imaging of the chest, abdomen and pelvis was performed following the standard protocol during bolus administration of intravenous contrast. RADIATION DOSE REDUCTION: This exam was performed according to the departmental dose-optimization program which includes automated exposure control, adjustment of the mA and/or kV according to patient size and/or use of iterative reconstruction technique. CONTRAST:  OMNIPAQUE IOHEXOL 300 MG/ML  SOLN COMPARISON:  CT abdomen pelvis 01/11/2005, x-ray pelvis 05/16/2023, x-ray pelvis 06/12/2023 FINDINGS: CHEST: Cardiovascular: No aortic injury. The thoracic aorta is normal in caliber. The heart is normal in size. No significant pericardial effusion. The main pulmonary artery is normal in caliber. No central or proximal segmental pulmonary embolus. No a calcification. Aortic valve leaflet calcifications. At least 3 vessel coronary calcification. Severe atherosclerotic plaque of the aorta. Mediastinum/Nodes: No pneumomediastinum. No mediastinal hematoma. The esophagus is unremarkable. The thyroid is unremarkable. The central airways are patent. No mediastinal, hilar, or axillary lymphadenopathy. Lungs/Pleura: No focal consolidation. No pulmonary nodule. No pulmonary mass. No pulmonary contusion or laceration. No pneumatocele formation. No pleural effusion. No pneumothorax. No hemothorax. Musculoskeletal/Chest wall: No chest wall mass. Old healed right rib fractures. No acute rib or sternal fracture. No spinal fracture. No acute fracture of the partially visualized upper extremities with motion artifact noted on this CT and correlated on the scout. ABDOMEN / PELVIS: Hepatobiliary: Not enlarged. No focal lesion. No laceration or subcapsular hematoma. The gallbladder is otherwise unremarkable with no radio-opaque gallstones. No  biliary ductal dilatation. Pancreas: Normal pancreatic contour. No main pancreatic duct dilatation. Spleen: Not enlarged. No focal lesion. No laceration, subcapsular hematoma, or vascular injury. Adrenals/Urinary Tract: No nodularity bilaterally. Bilateral kidneys enhance symmetrically. No hydronephrosis. No contusion, laceration, or subcapsular hematoma. No injury to the vascular structures or collecting systems. No hydroureter. The urinary bladder is unremarkable. Stomach/Bowel: No small or large bowel wall thickening or dilatation. The appendix is unremarkable. Vasculature/Lymphatics: Severe atherosclerotic plaque no abdominal aorta or iliac aneurysm. No active contrast extravasation or pseudoaneurysm. No abdominal, pelvic, inguinal lymphadenopathy. Reproductive: Uterus and bilateral adnexal regions are unremarkable. Other: Trace high density free  fluid along the left space of Retzius. No simple free fluid ascites. No pneumoperitoneum. No hemoperitoneum. No mesenteric hematoma identified. No organized fluid collection. Musculoskeletal: No significant soft tissue hematoma. Bilateral sacroiliac joint sclerosis. Acute displaced left superior pubic rami fracture. Acute nondisplaced left inferior pubic rami fracture. No spinal fracture. Grade 1 anterolisthesis of L4 on L5. Ports and Devices: None. IMPRESSION: 1. Acute displaced left superior pubic rami fracture. Acute nondisplaced left inferior pubic rami fracture. 2. Circumferential urinary bladder wall thickening as well as trace blood product along the left space of Retzius. Extraperitoneal as well as intraperitoneal urinary bladder injury not excluded. Correlate with urinalysis and consider cystogram for further evaluation if clinically indicated. 3. No acute intrathoracic or intra-abdominal traumatic injury. 4. No acute fracture or traumatic malalignment of the thoracic or lumbar spine. 5. Aortic Atherosclerosis (ICD10-I70.0) including mitral annular, coronary  artery, and aortic valve leaflet calcifications-correlate for aortic stenosis. Electronically Signed   By: Tish Frederickson M.D.   On: 06/12/2023 22:39   DG Hip Unilat With Pelvis 2-3 Views Left  Result Date: 06/12/2023 CLINICAL DATA:  Status post fall. Rotated appearance of the left proximal femur on pelvis radiographs EXAM: DG HIP (WITH OR WITHOUT PELVIS) 3V LEFT COMPARISON:  Same-day pelvis radiograph FINDINGS: Again seen is displaced fracture of the parasymphyseal left pubic ramus. Questionable nondisplaced fracture through the left inferior pubic ramus. No acute fracture or dislocation of the left proximal femur. There is no evidence of arthropathy or other focal bone abnormality. IMPRESSION: 1. Displaced fracture of the parasymphyseal left pubic ramus. Questionable nondisplaced fracture through the left inferior pubic ramus. 2. No acute fracture or dislocation of the left proximal femur. Electronically Signed   By: Agustin Cree M.D.   On: 06/12/2023 20:39   DG Pelvis Portable  Result Date: 06/12/2023 CLINICAL DATA:  Unwitnessed fall. EXAM: PORTABLE PELVIS 1-2 VIEWS COMPARISON:  May 16, 2023. FINDINGS: Minimally displaced fracture is seen involving the left superior pubic ramus. Severe degenerative changes are seen involving the sacroiliac joints. Left proximal femur somewhat rotated. IMPRESSION: Minimally displaced fracture involving the left superior pubic ramus. Left proximal femur is somewhat rotated; radiographs of left hip are recommended for further evaluation. Electronically Signed   By: Lupita Raider M.D.   On: 06/12/2023 18:56   DG Chest Port 1 View  Result Date: 06/12/2023 CLINICAL DATA:  Unwitnessed fall. EXAM: PORTABLE CHEST 1 VIEW COMPARISON:  May 16, 2023. FINDINGS: The heart size and mediastinal contours are within normal limits. Both lungs are clear. Old right rib fractures are noted. IMPRESSION: No active disease. Electronically Signed   By: Lupita Raider M.D.   On:  06/12/2023 18:53   CT Head Wo Contrast  Result Date: 06/12/2023 CLINICAL DATA:  Unwitnessed fall, head and neck trauma EXAM: CT HEAD WITHOUT CONTRAST CT CERVICAL SPINE WITHOUT CONTRAST TECHNIQUE: Multidetector CT imaging of the head and cervical spine was performed following the standard protocol without intravenous contrast. Multiplanar CT image reconstructions of the cervical spine were also generated. RADIATION DOSE REDUCTION: This exam was performed according to the departmental dose-optimization program which includes automated exposure control, adjustment of the mA and/or kV according to patient size and/or use of iterative reconstruction technique. COMPARISON:  05/16/2023 FINDINGS: CT HEAD FINDINGS Brain: Acute subdural hemorrhage overlying the left cerebral convexity, which measures up to 6 mm (series 6, image 38). There is likely some subarachnoid hemorrhage in the adjacent left temporal and frontal lobes (series 3, image 14). No evidence of acute infarct,  parenchymal hemorrhage, mass, mass effect, or midline shift. No hydrocephalus. Age related cerebral volume loss. Vascular: No hyperdense vessel. Skull: Evaluation is somewhat limited by motion. Within this limitation, negative for fracture or focal lesion. Left parietal scalp hematoma. Sinuses/Orbits: No acute finding. CT CERVICAL SPINE FINDINGS Alignment: No traumatic listhesis. Straightening and mild reversal of the normal cervical lordosis, with trace retrolisthesis of C5 on C6. Skull base and vertebrae: No acute fracture. No primary bone lesion or focal pathologic process. Soft tissues and spinal canal: No prevertebral fluid or swelling. No visible canal hematoma. Disc levels: Degenerative changes in the cervical spine. Moderate spinal canal stenosis at C5-C6. Upper chest: Negative. IMPRESSION: 1. Acute subdural hemorrhage overlying the left cerebral convexity, which measures up to 6 mm. There is likely some subarachnoid hemorrhage in the adjacent  left temporal and frontal lobes. No midline shift. 2. Left parietal scalp hematoma. 3. No acute fracture or traumatic listhesis in the cervical spine. These results were called by telephone at the time of interpretation on 06/12/2023 at 6:22 pm to provider [facility] , who verbally acknowledged these results. Electronically Signed   By: Wiliam Ke M.D.   On: 06/12/2023 18:22   CT Cervical Spine Wo Contrast  Result Date: 06/12/2023 CLINICAL DATA:  Unwitnessed fall, head and neck trauma EXAM: CT HEAD WITHOUT CONTRAST CT CERVICAL SPINE WITHOUT CONTRAST TECHNIQUE: Multidetector CT imaging of the head and cervical spine was performed following the standard protocol without intravenous contrast. Multiplanar CT image reconstructions of the cervical spine were also generated. RADIATION DOSE REDUCTION: This exam was performed according to the departmental dose-optimization program which includes automated exposure control, adjustment of the mA and/or kV according to patient size and/or use of iterative reconstruction technique. COMPARISON:  05/16/2023 FINDINGS: CT HEAD FINDINGS Brain: Acute subdural hemorrhage overlying the left cerebral convexity, which measures up to 6 mm (series 6, image 38). There is likely some subarachnoid hemorrhage in the adjacent left temporal and frontal lobes (series 3, image 14). No evidence of acute infarct, parenchymal hemorrhage, mass, mass effect, or midline shift. No hydrocephalus. Age related cerebral volume loss. Vascular: No hyperdense vessel. Skull: Evaluation is somewhat limited by motion. Within this limitation, negative for fracture or focal lesion. Left parietal scalp hematoma. Sinuses/Orbits: No acute finding. CT CERVICAL SPINE FINDINGS Alignment: No traumatic listhesis. Straightening and mild reversal of the normal cervical lordosis, with trace retrolisthesis of C5 on C6. Skull base and vertebrae: No acute fracture. No primary bone lesion or focal pathologic process.  Soft tissues and spinal canal: No prevertebral fluid or swelling. No visible canal hematoma. Disc levels: Degenerative changes in the cervical spine. Moderate spinal canal stenosis at C5-C6. Upper chest: Negative. IMPRESSION: 1. Acute subdural hemorrhage overlying the left cerebral convexity, which measures up to 6 mm. There is likely some subarachnoid hemorrhage in the adjacent left temporal and frontal lobes. No midline shift. 2. Left parietal scalp hematoma. 3. No acute fracture or traumatic listhesis in the cervical spine. These results were called by telephone at the time of interpretation on 06/12/2023 at 6:22 pm to provider [facility] , who verbally acknowledged these results. Electronically Signed   By: Wiliam Ke M.D.   On: 06/12/2023 18:22       Teeghan Hammer M.D. [facility] 06/14/2023, 10:10 AM  Available via Epic secure chat 7am-7pm After 7 pm, please refer to night coverage provider listed on amion.

## 2023-06-14 NOTE — Evaluation (Signed)
Occupational Therapy Evaluation and Discharge Patient Details Name: Karen [name] MRN: [id number] DOB: 27-Jul-1940 Today's Date: 06/14/2023   History of Present Illness Patient is a 82 year old female who is a long term resident at the memory care unit at [facility]. Unwitnessed fall at the facility. CT of head showed acute subdural hemorrhage, limely subarachnoid hemorrhage in left temporal and fontal lobes, scalp hematoma. Also found to have acute displaced left superior pubic rami fracture, acute nondisplaced left inferior pubic rami fracture   Clinical Impression   This 82 yo female admitted with above presents to acute OT with PLOF only noted in chart that she was ambulatory with many falls due to not consistently using her RW (would forget due to dementia) at memory care facility. Today she is resistant to any movement in bed and up to EOB. With her pre-existing decreased independence due to the dementia and now with the [facility], SDH, and the pubic rami fractures I do not see her benefiting from further skilled OT services, thus recommendation is to return to [facility] if they can provide total A or to SNF where there is increased level of care but without follow up OT. No further OT needs, we will sign off.       If plan is discharge home, recommend the following: Two people to help with walking and/or transfers;A lot of help with bathing/dressing/bathroom;Direct supervision/assist for financial management;Direct supervision/assist for medications management;Assist for transportation;Assistance with cooking/housework;Assistance with feeding;Help with stairs or ramp for entrance;Supervision due to cognitive status    Functional Status Assessment  Patient has had a recent decline in their functional status and/or demonstrates limited ability to make significant improvements in function in a reasonable and predictable amount of time  Equipment Recommendations  None recommended by OT        Precautions / Restrictions Precautions Precautions: Fall Restrictions Weight Bearing Restrictions: No RLE Weight Bearing: Weight bearing as tolerated LLE Weight Bearing: Weight bearing as tolerated      Mobility Bed Mobility Overal bed mobility: Needs Assistance Bed Mobility: Supine to Sit, Sit to Supine     Supine to sit: Total assist Sit to supine: Total assist   General bed mobility comments: Did not attempt to A with any movement, resistant to coming up to sit EOB from full HOB up and use of bed pad, resisted sitting EOB by posterior lean        Balance Overall balance assessment: Needs assistance Sitting-balance support: No upper extremity supported, Feet supported Sitting balance-Leahy Scale: Zero Sitting balance - Comments: extending at hips,making her want to slide off the bed                                   ADL either performed or assessed with clinical judgement   ADL                                         General ADL Comments: total bed level and resistive to all movement     Vision Baseline Vision/History: 1 Wears glasses (per picture in EPIC) Additional Comments: Kept eyes closed entire session            Pertinent Vitals/Pain Pain Assessment Pain Assessment: PAINAD Breathing: normal Negative Vocalization: occasional moan/groan, low speech, negative/disapproving quality Facial Expression: smiling or inexpressive Body  Language: tense, distressed pacing, fidgeting Consolability: distracted or reassured by voice/touch PAINAD Score: 3 Pain Location: All of the PAINID repsonses above were related to moving in bed to sit up Pain Descriptors / Indicators: Guarding, Moaning Pain Intervention(s): Monitored during session, Repositioned     Extremity/Trunk Assessment Upper Extremity Assessment Upper Extremity Assessment:  (does not follow commands to move arms, but did move arms spontaneously back to arms crossed at  chest after I got her to extend them.)      Communication Communication Communication: Difficulty communicating thoughts/reduced clarity of speech;Difficulty following commands/understanding Following commands:  (does not follow commands) Cueing Techniques: Verbal cues;Tactile cues   Cognition Arousal: Lethargic Behavior During Therapy: Flat affect Overall Cognitive Status: History of cognitive impairments - at baseline                                 General Comments: Patient is unable to follow commands during this session. Kept her eyes closed entire session                Home Living Family/patient expects to be discharged to:: Assisted living                             Home Equipment: Rolling Walker (2 wheels)   Additional Comments: per notes, patient is in the memory care unit at [facility]      Prior Functioning/Environment Prior Level of Function : Needs assist;History of Falls (last six months);Patient poor historian/Family not available             Mobility Comments: per notes, patient should walk with a walker at baseline but usually refuses which family thinks contributes to frequent falls ADLs Comments: presume assistance required secondary to cognitive status        OT Problem List: Decreased activity tolerance;Impaired balance (sitting and/or standing);Decreased cognition;Decreased safety awareness;Pain         OT Goals(Current goals can be found in the care plan section) Acute Rehab OT Goals Patient Stated Goal: unable and no family available         AM-PAC OT "6 Clicks" Daily Activity     Outcome Measure Help from another person eating meals?: Total Help from another person taking care of personal grooming?: Total Help from another person toileting, which includes using toliet, bedpan, or urinal?: Total Help from another person bathing (including washing, rinsing, drying)?: Total Help from another person to put  on and taking off regular upper body clothing?: Total Help from another person to put on and taking off regular lower body clothing?: Total 6 Click Score: 6   End of Session    Activity Tolerance: Patient limited by lethargy Patient left: in bed;with bed alarm set  OT Visit Diagnosis: Other abnormalities of gait and mobility (R26.89);History of falling (Z91.81);Cognitive communication deficit (R41.841) Symptoms and signs involving cognitive functions:  (dementia, new SDH and SAH)                Time: 5366-4403 OT Time Calculation (min): 20 min Charges:  OT General Charges $OT Visit: 1 Visit OT Evaluation $OT Eval Low Complexity: 1 Low  Lindon Romp OT Acute Rehabilitation Services Office [phone]    Evette Georges 06/14/2023, 11:44 AM

## 2023-06-14 NOTE — Plan of Care (Signed)
  Problem: Pain Management: Goal: General experience of comfort will improve Outcome: Progressing   Problem: Safety: Goal: Ability to remain free from injury will improve Outcome: Progressing   Problem: Skin Integrity: Goal: Risk for impaired skin integrity will decrease Outcome: Progressing

## 2023-06-14 NOTE — Consult Note (Signed)
Reason for Consult:Left pubic rami fxs Referring Physician: Thad Ranger Time called: 1610 Time at bedside: 0947   Karen [name] is an 82 y.o. female.  HPI: Arminda suffered an unwitnessed fall at the facility where she lives. She was brought to the ED where workup showed left superior and inferior pubic rami fxs and orthopedic surgery was consulted. She is demented and cannot contribute to history.  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, had mastectomy, chemo/radiation   Diabetes mellitus without complication (HCC)    High cholesterol    HTN (hypertension)    Malignant neoplasm of breast (female), unspecified site    left   Osteopenia    Sleep apnea    uses CPAP nightly    Past Surgical History:  Procedure Laterality Date   BREAST LUMPECTOMY Right 1990's   BREAST LUMPECTOMY WITH RADIOACTIVE SEED LOCALIZATION Left 12/29/2014   Procedure: LEFT BREAST LUMPECTOMY WITH RADIOACTIVE SEED LOCALIZATION;  Surgeon: Claud Kelp, MD;  Location: [facility];  Service: General;  Laterality: Left;   CATARACT EXTRACTION  07/07/14   left eye   COMBINED LAPAROSCOPY W/ HYSTEROSCOPY     MASTECTOMY Right 02/2014   TOTAL MASTECTOMY Right 02/09/2014   Procedure: RIGHT TOTAL MASTECTOMY;  Surgeon: Ernestene Mention, MD;  Location: [facility] OR;  Service: General;  Laterality: Right;   TUBAL LIGATION      Family History  Problem Relation Age of Onset   Hypertension Father    CVA Father    Diabetes Mother    Heart disease Mother    Diabetes Brother    CVA Brother    Breast cancer Paternal Aunt        dx 1s; deceased 9s   Breast cancer Paternal Aunt        dx late 78s; deceased 38   Leukemia Cousin        2 paternal female cousins    Social History:  reports that she quit smoking about 32 years ago. Her smoking use included cigarettes. She started smoking about 62 years ago. She has a 15 pack-year  smoking history. She has never used smokeless tobacco. She reports that she does not drink alcohol and does not use drugs.  Allergies: No Known Allergies  Medications: I have reviewed the patient's current medications.  Results for orders placed or performed during the hospital encounter of 06/12/23 (from the past 48 hour(s))  Comprehensive metabolic panel     Status: Abnormal   Collection Time: 06/12/23  6:38 PM  Result Value Ref Range   Sodium 141 135 - 145 mmol/L   Potassium 4.0 3.5 - 5.1 mmol/L   Chloride 106 98 - 111 mmol/L   CO2 28 22 - 32 mmol/L   Glucose, Bld 111 (H) 70 - 99 mg/dL    Comment: Glucose reference range applies only to samples taken after fasting for at least 8 hours.   BUN 57 (H) 8 - 23 mg/dL   Creatinine, Ser 9.60 (H) 0.44 - 1.00 mg/dL   Calcium 9.1 8.9 - 45.4 mg/dL   Total Protein 7.6 6.5 - 8.1 g/dL   Albumin 4.2 3.5 - 5.0 g/dL   AST 37 15 - 41 U/L   ALT 19 0 - 44 U/L   Alkaline Phosphatase 52 38 - 126 U/L   Total Bilirubin 0.5 <1.2 mg/dL   GFR, Estimated 52 (  L) >60 mL/min    Comment: (NOTE) Calculated using the CKD-EPI Creatinine Equation (2021)    Anion gap 7 5 - 15    Comment: Performed at [facility], [address], [place], [place] [address]  CBC with Differential     Status: Abnormal   Collection Time: 06/12/23  6:38 PM  Result Value Ref Range   WBC 5.8 4.0 - 10.5 K/uL   RBC 3.87 3.87 - 5.11 MIL/uL   Hemoglobin 11.7 (L) 12.0 - 15.0 g/dL   HCT 60.4 54.0 - 98.1 %   MCV 94.3 80.0 - 100.0 fL   MCH 30.2 26.0 - 34.0 pg   MCHC 32.1 30.0 - 36.0 g/dL   RDW 19.1 47.8 - 29.5 %   Platelets 132 (L) 150 - 400 K/uL   nRBC 0.0 0.0 - 0.2 %   Neutrophils Relative % 74 %   Neutro Abs 4.3 1.7 - 7.7 K/uL   Lymphocytes Relative 15 %   Lymphs Abs 0.9 0.7 - 4.0 K/uL   Monocytes Relative 8 %   Monocytes Absolute 0.5 0.1 - 1.0 K/uL   Eosinophils Relative 1 %   Eosinophils Absolute 0.1 0.0 - 0.5 K/uL   Basophils Relative 1 %   Basophils  Absolute 0.0 0.0 - 0.1 K/uL   Immature Granulocytes 1 %   Abs Immature Granulocytes 0.03 0.00 - 0.07 K/uL    Comment: Performed at [facility], [address], [place], [place] [address]  Urinalysis, w/ Reflex to Culture (Infection Suspected) -Urine, Clean Catch     Status: Abnormal   Collection Time: 06/12/23  7:21 PM  Result Value Ref Range   Specimen Source URINE, CLEAN CATCH    Color, Urine YELLOW YELLOW   APPearance CLEAR CLEAR   Specific Gravity, Urine 1.023 1.005 - 1.030   pH 6.0 5.0 - 8.0   Glucose, UA NEGATIVE NEGATIVE mg/dL   Hgb urine dipstick NEGATIVE NEGATIVE   Bilirubin Urine NEGATIVE NEGATIVE   Ketones, ur NEGATIVE NEGATIVE mg/dL   Protein, ur 30 (A) NEGATIVE mg/dL   Nitrite NEGATIVE NEGATIVE   Leukocytes,Ua NEGATIVE NEGATIVE   RBC / HPF 0-5 0 - 5 RBC/hpf   WBC, UA 0-5 0 - 5 WBC/hpf    Comment:        Reflex urine culture not performed if WBC <=10, OR if Squamous epithelial cells >5. If Squamous epithelial cells >5 suggest recollection.    Bacteria, UA NONE SEEN NONE SEEN   Squamous Epithelial / HPF 0-5 0 - 5 /HPF   Mucus PRESENT     Comment: Performed at [facility], [address], [place], [place] [address]  CBG monitoring, ED     Status: None   Collection Time: 06/13/23  8:10 AM  Result Value Ref Range   Glucose-Capillary 94 70 - 99 mg/dL    Comment: Glucose reference range applies only to samples taken after fasting for at least 8 hours.  CBG monitoring, ED     Status: Abnormal   Collection Time: 06/13/23 12:07 PM  Result Value Ref Range   Glucose-Capillary 103 (H) 70 - 99 mg/dL    Comment: Glucose reference range applies only to samples taken after fasting for at least 8 hours.  Glucose, capillary     Status: None   Collection Time: 06/13/23  5:31 PM  Result Value Ref Range   Glucose-Capillary 86 70 - 99 mg/dL    Comment: Glucose reference range applies only to samples  taken after fasting for at least 8 hours.   Hemoglobin A1c     Status: None   Collection Time: 06/13/23  5:37 PM  Result Value Ref Range   Hgb A1c MFr Bld 5.5 4.8 - 5.6 %    Comment: (NOTE) Pre diabetes:          5.7%-6.4%  Diabetes:              >6.4%  Glycemic control for   <7.0% adults with diabetes    Mean Plasma Glucose 111.15 mg/dL    Comment: Performed at [facility], [address] [place], [place] [address]  Glucose, capillary     Status: None   Collection Time: 06/13/23  8:52 PM  Result Value Ref Range   Glucose-Capillary 77 70 - 99 mg/dL    Comment: Glucose reference range applies only to samples taken after fasting for at least 8 hours.  Basic metabolic panel     Status: Abnormal   Collection Time: 06/14/23  5:13 AM  Result Value Ref Range   Sodium 144 135 - 145 mmol/L   Potassium 3.5 3.5 - 5.1 mmol/L   Chloride 105 98 - 111 mmol/L   CO2 28 22 - 32 mmol/L   Glucose, Bld 105 (H) 70 - 99 mg/dL    Comment: Glucose reference range applies only to samples taken after fasting for at least 8 hours.   BUN 44 (H) 8 - 23 mg/dL   Creatinine, Ser 2.44 0.44 - 1.00 mg/dL   Calcium 9.1 8.9 - 01.0 mg/dL   GFR, Estimated 58 (L) >60 mL/min    Comment: (NOTE) Calculated using the CKD-EPI Creatinine Equation (2021)    Anion gap 11 5 - 15    Comment: Performed at [facility], [address], [place], [place] [address]  CBC     Status: Abnormal   Collection Time: 06/14/23  5:13 AM  Result Value Ref Range   WBC 4.7 4.0 - 10.5 K/uL   RBC 3.97 3.87 - 5.11 MIL/uL   Hemoglobin 11.3 (L) 12.0 - 15.0 g/dL   HCT 66.4 40.3 - 47.4 %   MCV 91.2 80.0 - 100.0 fL   MCH 28.5 26.0 - 34.0 pg   MCHC 31.2 30.0 - 36.0 g/dL   RDW 25.9 56.3 - 87.5 %   Platelets 122 (L) 150 - 400 K/uL   nRBC 0.0 0.0 - 0.2 %    Comment: Performed at [facility], [address], [place], [place] [address]  Glucose, capillary     Status: None   Collection Time: 06/14/23  7:29 AM  Result Value Ref Range   Glucose-Capillary 85 70 - 99  mg/dL    Comment: Glucose reference range applies only to samples taken after fasting for at least 8 hours.    CT HEAD WO CONTRAST ( )  Result Date: 06/13/2023 CLINICAL DATA:  Subdural hematoma follow-up EXAM: CT HEAD WITHOUT CONTRAST TECHNIQUE: Contiguous axial images were obtained from the base of the skull through the vertex without intravenous contrast. RADIATION DOSE REDUCTION: This exam was performed according to the departmental dose-optimization program which includes automated exposure control, adjustment of the mA and/or kV according to patient size and/or use of iterative reconstruction technique. COMPARISON:  06/12/2023 FINDINGS: Brain: Unchanged appearance of small left convexity subdural hematoma allowing for redistribution. Persistent subarachnoid blood over the left parietal lobe and in the left lateral ventricle. No new site of hemorrhage. Size and configuration ventricles are  unchanged. Vascular: There is atherosclerotic calcification of both internal carotid arteries at the skull base. Skull: Left parietal scalp hematoma Sinuses/Orbits: Paranasal sinuses are clear. No mastoid effusion. Normal orbits. Other: None IMPRESSION: 1. Unchanged appearance of small left convexity subdural hematoma allowing for redistribution. 2. Persistent subarachnoid blood over the left parietal lobe and in the left lateral ventricle. 3. No new site of hemorrhage. Electronically Signed   By: Deatra Robinson M.D.   On: 06/13/2023 02:22   CT CHEST ABDOMEN PELVIS W CONTRAST  Result Date: 06/12/2023 CLINICAL DATA:  Polytrauma, blunt or unwitnessed fall, unknown if hit head, no thinners. Old bruising noted to forehead, C-Collar in place. EXAM: CT CHEST, ABDOMEN, AND PELVIS WITH CONTRAST TECHNIQUE: Multidetector CT imaging of the chest, abdomen and pelvis was performed following the standard protocol during bolus administration of intravenous contrast. RADIATION DOSE REDUCTION: This exam was performed according to  the departmental dose-optimization program which includes automated exposure control, adjustment of the mA and/or kV according to patient size and/or use of iterative reconstruction technique. CONTRAST:  OMNIPAQUE IOHEXOL 300 MG/ML  SOLN COMPARISON:  CT abdomen pelvis 01/11/2005, x-ray pelvis 05/16/2023, x-ray pelvis 06/12/2023 FINDINGS: CHEST: Cardiovascular: No aortic injury. The thoracic aorta is normal in caliber. The heart is normal in size. No significant pericardial effusion. The main pulmonary artery is normal in caliber. No central or proximal segmental pulmonary embolus. No a calcification. Aortic valve leaflet calcifications. At least 3 vessel coronary calcification. Severe atherosclerotic plaque of the aorta. Mediastinum/Nodes: No pneumomediastinum. No mediastinal hematoma. The esophagus is unremarkable. The thyroid is unremarkable. The central airways are patent. No mediastinal, hilar, or axillary lymphadenopathy. Lungs/Pleura: No focal consolidation. No pulmonary nodule. No pulmonary mass. No pulmonary contusion or laceration. No pneumatocele formation. No pleural effusion. No pneumothorax. No hemothorax. Musculoskeletal/Chest wall: No chest wall mass. Old healed right rib fractures. No acute rib or sternal fracture. No spinal fracture. No acute fracture of the partially visualized upper extremities with motion artifact noted on this CT and correlated on the scout. ABDOMEN / PELVIS: Hepatobiliary: Not enlarged. No focal lesion. No laceration or subcapsular hematoma. The gallbladder is otherwise unremarkable with no radio-opaque gallstones. No biliary ductal dilatation. Pancreas: Normal pancreatic contour. No main pancreatic duct dilatation. Spleen: Not enlarged. No focal lesion. No laceration, subcapsular hematoma, or vascular injury. Adrenals/Urinary Tract: No nodularity bilaterally. Bilateral kidneys enhance symmetrically. No hydronephrosis. No contusion, laceration, or subcapsular hematoma. No  injury to the vascular structures or collecting systems. No hydroureter. The urinary bladder is unremarkable. Stomach/Bowel: No small or large bowel wall thickening or dilatation. The appendix is unremarkable. Vasculature/Lymphatics: Severe atherosclerotic plaque no abdominal aorta or iliac aneurysm. No active contrast extravasation or pseudoaneurysm. No abdominal, pelvic, inguinal lymphadenopathy. Reproductive: Uterus and bilateral adnexal regions are unremarkable. Other: Trace high density free fluid along the left space of Retzius. No simple free fluid ascites. No pneumoperitoneum. No hemoperitoneum. No mesenteric hematoma identified. No organized fluid collection. Musculoskeletal: No significant soft tissue hematoma. Bilateral sacroiliac joint sclerosis. Acute displaced left superior pubic rami fracture. Acute nondisplaced left inferior pubic rami fracture. No spinal fracture. Grade 1 anterolisthesis of L4 on L5. Ports and Devices: None. IMPRESSION: 1. Acute displaced left superior pubic rami fracture. Acute nondisplaced left inferior pubic rami fracture. 2. Circumferential urinary bladder wall thickening as well as trace blood product along the left space of Retzius. Extraperitoneal as well as intraperitoneal urinary bladder injury not excluded. Correlate with urinalysis and consider cystogram for further evaluation if clinically indicated. 3. No acute  intrathoracic or intra-abdominal traumatic injury. 4. No acute fracture or traumatic malalignment of the thoracic or lumbar spine. 5. Aortic Atherosclerosis (ICD10-I70.0) including mitral annular, coronary artery, and aortic valve leaflet calcifications-correlate for aortic stenosis. Electronically Signed   By: Tish Frederickson M.D.   On: 06/12/2023 22:39   DG Hip Unilat With Pelvis 2-3 Views Left  Result Date: 06/12/2023 CLINICAL DATA:  Status post fall. Rotated appearance of the left proximal femur on pelvis radiographs EXAM: DG HIP (WITH OR WITHOUT PELVIS)  3V LEFT COMPARISON:  Same-day pelvis radiograph FINDINGS: Again seen is displaced fracture of the parasymphyseal left pubic ramus. Questionable nondisplaced fracture through the left inferior pubic ramus. No acute fracture or dislocation of the left proximal femur. There is no evidence of arthropathy or other focal bone abnormality. IMPRESSION: 1. Displaced fracture of the parasymphyseal left pubic ramus. Questionable nondisplaced fracture through the left inferior pubic ramus. 2. No acute fracture or dislocation of the left proximal femur. Electronically Signed   By: Agustin Cree M.D.   On: 06/12/2023 20:39   DG Pelvis Portable  Result Date: 06/12/2023 CLINICAL DATA:  Unwitnessed fall. EXAM: PORTABLE PELVIS 1-2 VIEWS COMPARISON:  May 16, 2023. FINDINGS: Minimally displaced fracture is seen involving the left superior pubic ramus. Severe degenerative changes are seen involving the sacroiliac joints. Left proximal femur somewhat rotated. IMPRESSION: Minimally displaced fracture involving the left superior pubic ramus. Left proximal femur is somewhat rotated; radiographs of left hip are recommended for further evaluation. Electronically Signed   By: Lupita Raider M.D.   On: 06/12/2023 18:56   DG Chest Port 1 View  Result Date: 06/12/2023 CLINICAL DATA:  Unwitnessed fall. EXAM: PORTABLE CHEST 1 VIEW COMPARISON:  May 16, 2023. FINDINGS: The heart size and mediastinal contours are within normal limits. Both lungs are clear. Old right rib fractures are noted. IMPRESSION: No active disease. Electronically Signed   By: Lupita Raider M.D.   On: 06/12/2023 18:53   CT Head Wo Contrast  Result Date: 06/12/2023 CLINICAL DATA:  Unwitnessed fall, head and neck trauma EXAM: CT HEAD WITHOUT CONTRAST CT CERVICAL SPINE WITHOUT CONTRAST TECHNIQUE: Multidetector CT imaging of the head and cervical spine was performed following the standard protocol without intravenous contrast. Multiplanar CT image reconstructions  of the cervical spine were also generated. RADIATION DOSE REDUCTION: This exam was performed according to the departmental dose-optimization program which includes automated exposure control, adjustment of the mA and/or kV according to patient size and/or use of iterative reconstruction technique. COMPARISON:  05/16/2023 FINDINGS: CT HEAD FINDINGS Brain: Acute subdural hemorrhage overlying the left cerebral convexity, which measures up to 6 mm (series 6, image 38). There is likely some subarachnoid hemorrhage in the adjacent left temporal and frontal lobes (series 3, image 14). No evidence of acute infarct, parenchymal hemorrhage, mass, mass effect, or midline shift. No hydrocephalus. Age related cerebral volume loss. Vascular: No hyperdense vessel. Skull: Evaluation is somewhat limited by motion. Within this limitation, negative for fracture or focal lesion. Left parietal scalp hematoma. Sinuses/Orbits: No acute finding. CT CERVICAL SPINE FINDINGS Alignment: No traumatic listhesis. Straightening and mild reversal of the normal cervical lordosis, with trace retrolisthesis of C5 on C6. Skull base and vertebrae: No acute fracture. No primary bone lesion or focal pathologic process. Soft tissues and spinal canal: No prevertebral fluid or swelling. No visible canal hematoma. Disc levels: Degenerative changes in the cervical spine. Moderate spinal canal stenosis at C5-C6. Upper chest: Negative. IMPRESSION: 1. Acute subdural hemorrhage overlying the left  cerebral convexity, which measures up to 6 mm. There is likely some subarachnoid hemorrhage in the adjacent left temporal and frontal lobes. No midline shift. 2. Left parietal scalp hematoma. 3. No acute fracture or traumatic listhesis in the cervical spine. These results were called by telephone at the time of interpretation on 06/12/2023 at 6:22 pm to provider [facility] , who verbally acknowledged these results. Electronically Signed   By: Wiliam Ke M.D.    On: 06/12/2023 18:22   CT Cervical Spine Wo Contrast  Result Date: 06/12/2023 CLINICAL DATA:  Unwitnessed fall, head and neck trauma EXAM: CT HEAD WITHOUT CONTRAST CT CERVICAL SPINE WITHOUT CONTRAST TECHNIQUE: Multidetector CT imaging of the head and cervical spine was performed following the standard protocol without intravenous contrast. Multiplanar CT image reconstructions of the cervical spine were also generated. RADIATION DOSE REDUCTION: This exam was performed according to the departmental dose-optimization program which includes automated exposure control, adjustment of the mA and/or kV according to patient size and/or use of iterative reconstruction technique. COMPARISON:  05/16/2023 FINDINGS: CT HEAD FINDINGS Brain: Acute subdural hemorrhage overlying the left cerebral convexity, which measures up to 6 mm (series 6, image 38). There is likely some subarachnoid hemorrhage in the adjacent left temporal and frontal lobes (series 3, image 14). No evidence of acute infarct, parenchymal hemorrhage, mass, mass effect, or midline shift. No hydrocephalus. Age related cerebral volume loss. Vascular: No hyperdense vessel. Skull: Evaluation is somewhat limited by motion. Within this limitation, negative for fracture or focal lesion. Left parietal scalp hematoma. Sinuses/Orbits: No acute finding. CT CERVICAL SPINE FINDINGS Alignment: No traumatic listhesis. Straightening and mild reversal of the normal cervical lordosis, with trace retrolisthesis of C5 on C6. Skull base and vertebrae: No acute fracture. No primary bone lesion or focal pathologic process. Soft tissues and spinal canal: No prevertebral fluid or swelling. No visible canal hematoma. Disc levels: Degenerative changes in the cervical spine. Moderate spinal canal stenosis at C5-C6. Upper chest: Negative. IMPRESSION: 1. Acute subdural hemorrhage overlying the left cerebral convexity, which measures up to 6 mm. There is likely some subarachnoid hemorrhage  in the adjacent left temporal and frontal lobes. No midline shift. 2. Left parietal scalp hematoma. 3. No acute fracture or traumatic listhesis in the cervical spine. These results were called by telephone at the time of interpretation on 06/12/2023 at 6:22 pm to provider [facility] , who verbally acknowledged these results. Electronically Signed   By: Wiliam Ke M.D.   On: 06/12/2023 18:22    Review of Systems  Unable to perform ROS: Dementia   Blood pressure (!) 149/71, pulse (!) 57, temperature 97.6 F (36.4 C), resp. rate 16, height 4\' 10"  (1.473 m), weight 36 kg, SpO2 97%. Physical Exam Constitutional:      General: She is not in acute distress.    Appearance: She is well-developed. She is not diaphoretic.  HENT:     Head: Normocephalic and atraumatic.  Eyes:     General: No scleral icterus.       Right eye: No discharge.        Left eye: No discharge.     Conjunctiva/sclera: Conjunctivae normal.  Cardiovascular:     Rate and Rhythm: Normal rate and regular rhythm.  Pulmonary:     Effort: Pulmonary effort is normal. No respiratory distress.  Musculoskeletal:     Cervical back: Normal range of motion.     Comments: LLE No traumatic wounds, ecchymosis, or rash  Nontender  No knee or ankle  effusion  Knee stable to varus/ valgus and anterior/posterior stress  Sens DPN, SPN, TN could not assess  Motor EHL, ext, flex, evers could not assess  DP 1+, PT 1+, No significant edema  Skin:    General: Skin is warm and dry.  Psychiatric:        Mood and Affect: Mood normal.        Behavior: Behavior normal.     Assessment/Plan: Left sup/inf pubic rami fxs -- Pt may be WBAT BLE. F/u with Dr. Jena Gauss in 3 weeks.    Freeman Caldron, PA-C Orthopedic Surgery [phone] 06/14/2023, 9:52 AM

## 2023-06-14 NOTE — Evaluation (Addendum)
Physical Therapy Evaluation Patient Details Name: IMOGEN MAGILL MRN: [id number] DOB: 07/20/1940 Today's Date: 06/14/2023  History of Present Illness  Patient is a 82 year old female who is a long term resident at the memory care unit at [facility]. Unwitnessed fall at the facility. CT of head showed acute subdural hemorrhage, likely subarachnoid hemorrhage in left temporal and fontal lobes, scalp hematoma. Also found to have acute displaced left superior pubic rami fracture, acute nondisplaced left inferior pubic rami fracture  Clinical Impression  PT evaluation completed. Patient has difficulty with communication and following commands today. She is from a memory care unit and is usually ambulatory without a device, although rolling walker had been recommended at the facility per report. Frequent falls reported.  Today, the patient required total assistance with all mobility, including rolling and sitting up on edge of bed. Max A required to maintain balance with intermittent resistance to sitting upright. Brief period of CGA with sitting upright, otherwise left and posterior lean. Patient keeps eyes closed for most of session but does appear to have increased alertness with mobility efforts with brief eye opening. Heart rate in the 70's with mobility. Patient does have occasional moaning and reaches for L groin area with any movement of the LLE. Unable to progress OOB activity safely at this time due to lethargy and poor participation.  The patient does not appear to be at her baseline level of independence. Anticipate the patient will require increased physical assistance with all mobility and ADLs. Recommend PT continue to follow while in the hospital to maximize independence as patient able to participate. Rehabilitation < 3 hours/day recommended after this hospital stay pending patient/family goals.       If plan is discharge home, recommend the following: Two people to help with walking  and/or transfers;Two people to help with bathing/dressing/bathroom;Assist for transportation;Help with stairs or ramp for entrance;Supervision due to cognitive status;Assistance with cooking/housework;Assistance with feeding;Direct supervision/assist for medications management;Direct supervision/assist for financial management   Can travel by private vehicle   No    Equipment Recommendations  (to be determined at next level of care)  Recommendations for Other Services       Functional Status Assessment Patient has had a recent decline in their functional status and demonstrates the ability to make significant improvements in function in a reasonable and predictable amount of time.     Precautions / Restrictions Precautions Precautions: Fall Restrictions Weight Bearing Restrictions: Yes RLE Weight Bearing: Weight bearing as tolerated LLE Weight Bearing: Weight bearing as tolerated      Mobility  Bed Mobility Overal bed mobility: Needs Assistance Bed Mobility: Rolling, Supine to Sit, Sit to Supine Rolling: Total assist   Supine to sit: Total assist Sit to supine: Total assist   General bed mobility comments: patient is often resistive to movement. unable to follow commands for participation with bed mobility efforts and often resistive to movement. maximal encouragement and cues provided for participation. several rolling bouts performed to L and R for repositioning of bed pad    Transfers                   General transfer comment: unsafe to attempt at this time    Ambulation/Gait               General Gait Details: unable to attempt due to poor participation and lethargy  Stairs            Wheelchair Mobility  Tilt Bed    Modified Rankin (Stroke Patients Only)       Balance Overall balance assessment: Needs assistance, History of Falls Sitting-balance support: Bilateral upper extremity supported, Feet unsupported Sitting balance-Leahy  Scale: Zero Sitting balance - Comments: Max A required to maintain midline sitting balance and patient often resistive and attempting to return to bed. brief period of CGA to maintain midline sitting balance. sitting tolerance less than one minute Postural control: Left lateral lean, Posterior lean                                   Pertinent Vitals/Pain Pain Assessment Pain Assessment: Faces Faces Pain Scale: Hurts a little bit Pain Location: patient reaching for L groin with movement of LLE, otherwise no signs of pain Pain Descriptors / Indicators: Guarding, Moaning Pain Intervention(s): Monitored during session, Limited activity within patient's tolerance, Repositioned    Home Living Family/patient expects to be discharged to:: Skilled nursing facility                   Additional Comments: per notes, patient is in the memory care unit at [facility]    Prior Function Prior Level of Function : Needs assist;History of Falls (last six months);Patient poor historian/Family not available             Mobility Comments: per notes, patient should walk with a walker at baseline but usually refuses which family thinks contributes to frequent falls ADLs Comments: presume assistance required secondary to cognitive status     Extremity/Trunk Assessment   Upper Extremity Assessment Upper Extremity Assessment: Generalized weakness;Difficult to assess due to impaired cognition (does not follow commands for formal MMT. spontaneous movement of BUE, arm crossing, intermittently grabbing L groin area)    Lower Extremity Assessment Lower Extremity Assessment: Generalized weakness;Difficult to assess due to impaired cognition (grimicing with any movement of LLE (reaching for L groin). spontaneous movement of BLE noted)       Communication   Communication Communication: Difficulty communicating thoughts/reduced clarity of speech;Difficulty following  commands/understanding Following commands: Follows one step commands inconsistently Cueing Techniques: Verbal cues;Gestural cues;Tactile cues;Visual cues  Cognition Arousal: Lethargic Behavior During Therapy: Flat affect Overall Cognitive Status: History of cognitive impairments - at baseline                                 General Comments: Patient is unable to follow commands during this session despite multi modal cues. She keeps her eyes closed with brief periods of eye opening with stimulation.        General Comments General comments (skin integrity, edema, etc.): heart rate in the 70's with movement    Exercises     Assessment/Plan    PT Assessment Patient needs continued PT services  PT Problem List Decreased strength;Decreased range of motion;Decreased activity tolerance;Decreased balance;Decreased mobility;Decreased knowledge of use of DME;Decreased cognition;Decreased safety awareness;Decreased knowledge of precautions;Pain       PT Treatment Interventions DME instruction;Gait training;Functional mobility training;Therapeutic activities;Therapeutic exercise;Neuromuscular re-education;Balance training;Cognitive remediation;Patient/family education;Wheelchair mobility training    PT Goals (Current goals can be found in the Care Plan section)  Acute Rehab PT Goals Patient Stated Goal: patient unable to participate in goal setting PT Goal Formulation: Patient unable to participate in goal setting Time For Goal Achievement: 06/21/23 Potential to Achieve Goals: Poor    Frequency Min  1X/week     Co-evaluation               AM-PAC PT "6 Clicks" Mobility  Outcome Measure Help needed turning from your back to your side while in a flat bed without using bedrails?: Total Help needed moving from lying on your back to sitting on the side of a flat bed without using bedrails?: Total Help needed moving to and from a bed to a chair (including a wheelchair)?:  Total Help needed standing up from a chair using your arms (e.g., wheelchair or bedside chair)?: Total Help needed to walk in hospital room?: Total Help needed climbing 3-5 steps with a railing? : Total 6 Click Score: 6    End of Session   Activity Tolerance: Patient limited by lethargy;Patient limited by fatigue (limited by cognition) Patient left: in bed;with call bell/phone within reach;with bed alarm set;with SCD's reapplied Nurse Communication: Mobility status PT Visit Diagnosis: Difficulty in walking, not elsewhere classified (R26.2);History of falling (Z91.81)    Time: 1000-1014 PT Time Calculation (min) (ACUTE ONLY): 14 min   Charges:   PT Evaluation $PT Eval Low Complexity: 1 Low   PT General Charges $$ ACUTE PT VISIT: 1 Visit         Donna Bernard, PT, MPT   Ina Homes 06/14/2023, 10:40 AM

## 2023-06-15 DIAGNOSIS — Z7189 Other specified counseling: Secondary | ICD-10-CM | POA: Diagnosis not present

## 2023-06-15 DIAGNOSIS — S065XAA Traumatic subdural hemorrhage with loss of consciousness status unknown, initial encounter: Secondary | ICD-10-CM | POA: Diagnosis not present

## 2023-06-15 DIAGNOSIS — W19XXXA Unspecified fall, initial encounter: Secondary | ICD-10-CM | POA: Diagnosis not present

## 2023-06-15 DIAGNOSIS — Z515 Encounter for palliative care: Secondary | ICD-10-CM | POA: Diagnosis not present

## 2023-06-15 DIAGNOSIS — S32592A Other specified fracture of left pubis, initial encounter for closed fracture: Secondary | ICD-10-CM | POA: Diagnosis not present

## 2023-06-15 LAB — CBC
HCT: 36.1 % (ref 36.0–46.0)
Hemoglobin: 11.6 g/dL — ABNORMAL LOW (ref 12.0–15.0)
MCH: 29.3 pg (ref 26.0–34.0)
MCHC: 32.1 g/dL (ref 30.0–36.0)
MCV: 91.2 fL (ref 80.0–100.0)
Platelets: 132 10*3/uL — ABNORMAL LOW (ref 150–400)
RBC: 3.96 MIL/uL (ref 3.87–5.11)
RDW: 13 % (ref 11.5–15.5)
WBC: 6.5 10*3/uL (ref 4.0–10.5)
nRBC: 0 % (ref 0.0–0.2)

## 2023-06-15 LAB — GLUCOSE, CAPILLARY
Glucose-Capillary: 122 mg/dL — ABNORMAL HIGH (ref 70–99)
Glucose-Capillary: 162 mg/dL — ABNORMAL HIGH (ref 70–99)
Glucose-Capillary: 121 mg/dL — ABNORMAL HIGH (ref 70–99)
Glucose-Capillary: 127 mg/dL — ABNORMAL HIGH (ref 70–99)
Glucose-Capillary: 118 mg/dL — ABNORMAL HIGH (ref 70–99)

## 2023-06-15 LAB — BASIC METABOLIC PANEL
Anion gap: 8 (ref 5–15)
BUN: 52 mg/dL — ABNORMAL HIGH (ref 8–23)
CO2: 29 mmol/L (ref 22–32)
Calcium: 9.1 mg/dL (ref 8.9–10.3)
Chloride: 104 mmol/L (ref 98–111)
Creatinine, Ser: 1.09 mg/dL — ABNORMAL HIGH (ref 0.44–1.00)
GFR, Estimated: 51 mL/min — ABNORMAL LOW (ref >60)
Glucose, Bld: 138 mg/dL — ABNORMAL HIGH (ref 70–99)
Potassium: 3.6 mmol/L (ref 3.5–5.1)
Sodium: 141 mmol/L (ref 135–145)

## 2023-06-15 MED ORDER — ALPRAZOLAM 0.5 MG PO TABS: 0.5000 mg | ORAL_TABLET | Freq: Two times a day (BID) | ORAL | 0 refills | Status: AC | PRN

## 2023-06-15 NOTE — TOC Progression Note (Addendum)
Transition of Care [facility]) - Progression Note    Patient Details  Name: Karen [name] MRN: [id number] Date of Birth: 03-26-1941  Transition of Care [facility]) CM/SW Contact  Lorri Frederick, LCSW Phone Number: 06/15/2023, 1:29 PM  Clinical Narrative:   CSW spoke with Cherie/[facility]: she will meet with family at 2pm today.  CSW faxed FL2 to [facility] memory care, also spoke with them yesterday, was directed to any RN on memory care unit for pt return.  They can receive pt with hospice when ready.   1445: message from Cheri/[facility].  Family is moving forward with hospice services at [facility].  Will need to get a new bed, should be in place for DC tomorrow.    CSW spoke with tech on the memory care unit at [facility].  RN Judyann Munson not available but is aware of plan for pt to return tomorrow.      Expected Discharge Plan and Services                                               Social Determinants of Health (SDOH) Interventions SDOH Screenings   Food Insecurity: Patient Unable To Answer (06/13/2023)  Housing: Patient Unable To Answer (06/13/2023)  Transportation Needs: Patient Unable To Answer (06/13/2023)  Utilities: Patient Unable To Answer (06/13/2023)  Tobacco Use: Medium Risk (06/12/2023)    Readmission Risk Interventions     No data to display

## 2023-06-15 NOTE — [place] FL2 (Signed)
[place] MEDICAID FL2 LEVEL OF CARE FORM     IDENTIFICATION  Patient Name: Karen [name] Birthdate: 10-22-1940 Sex: female Admission Date (Current Location): 06/12/2023  [facility] and [place] Number:  Producer, television/film/video and Address:  [facility], [address] [place], [place] [address]      Provider Number: [id number]  Attending Physician Name and Address:  Cathren Harsh, MD  Relative Name and Phone Number:  Stephinie, Jollie   [phone]    Current Level of Care: Hospital Recommended Level of Care: Memory Care [facility] memory care) Prior Approval Number:    Date Approved/Denied:   PASRR Number:    Discharge Plan: Other (Comment) [facility] memory care)    Current Diagnoses: Patient Active Problem List   Diagnosis Date Noted   Closed fracture of multiple pubic rami, left, initial encounter (HCC) 06/14/2023   Essential hypertension 06/14/2023   Controlled type 2 diabetes mellitus without complication, without long-term current use of insulin (HCC) 06/14/2023   Fall, initial encounter 06/13/2023   Subdural hematoma (HCC) 06/13/2023   Memory loss 09/17/2018   Obstructive sleep apnea treated with continuous positive airway pressure (CPAP) 09/25/2017   Dementia (HCC) 09/25/2017   Abnormal mammogram of left breast 12/29/2014   Pain in the chest 09/28/2014   Hyperlipidemia 09/28/2014   Ductal carcinoma in situ (DCIS) of right breast 02/25/2014    Orientation RESPIRATION BLADDER Height & Weight      (not oriented)  Normal Incontinent, External catheter Weight: 79 lb 5.9 oz (36 kg) Height:  4\' 10"  (147.3 cm)  BEHAVIORAL SYMPTOMS/MOOD NEUROLOGICAL BOWEL NUTRITION STATUS      Incontinent Diet (dysphagia 2 diet)  AMBULATORY STATUS COMMUNICATION OF NEEDS Skin   Total Care Does not communicate Normal                       Personal Care Assistance Level of Assistance  Total care       Total Care Assistance: Maximum  assistance   Functional Limitations Info  Sight, Hearing, Speech Sight Info: Adequate Hearing Info: Impaired Speech Info: Adequate    SPECIAL CARE FACTORS FREQUENCY                       Contractures Contractures Info: Not present    Additional Factors Info  Code Status, Allergies, Insulin Sliding Scale Code Status Info: DNR Allergies Info: NKA   Insulin Sliding Scale Info: novolog: see discharge summary       Current Medications (06/15/2023):  This is the current hospital active medication list Current Facility-Administered Medications  Medication Dose Route Frequency Provider Last Rate Last Admin   acetaminophen (TYLENOL) tablet 650 mg  650 mg Oral Q6H PRN Dahal, Melina Schools, MD       Or   acetaminophen (TYLENOL) suppository 650 mg  650 mg Rectal Q6H PRN Dahal, Binaya, MD       albuterol (PROVENTIL) (2.5 MG/3ML) 0.083% nebulizer solution 2.5 mg  2.5 mg Nebulization Q6H PRN Dahal, Binaya, MD       ALPRAZolam (XANAX) tablet 0.5 mg  0.5 mg Oral BID PRN Rai, Ripudeep K, MD       feeding supplement (ENSURE ENLIVE / ENSURE PLUS) liquid 237 mL  237 mL Oral BID BM Dahal, Binaya, MD   237 mL at 06/15/23 0945   hydrALAZINE (APRESOLINE) injection 10 mg  10 mg Intravenous Q6H PRN Lorin Glass, MD  influenza vaccine adjuvanted (FLUAD) injection 0.5 mL  0.5 mL Intramuscular Tomorrow-1000 Dahal, Binaya, MD       insulin aspart (novoLOG) injection 0-5 Units  0-5 Units Subcutaneous QHS Dahal, Binaya, MD       insulin aspart (novoLOG) injection 0-9 Units  0-9 Units Subcutaneous TID WC Dahal, Melina Schools, MD   1 Units at 06/15/23 0938   irbesartan (AVAPRO) tablet 150 mg  150 mg Oral Daily Rai, Ripudeep K, MD   150 mg at 06/15/23 4401   levETIRAcetam (KEPPRA) tablet 500 mg  500 mg Oral BID Rai, Ripudeep K, MD   500 mg at 06/15/23 0272   mirabegron ER (MYRBETRIQ) tablet 25 mg  25 mg Oral Daily Rai, Ripudeep K, MD   25 mg at 06/15/23 5366   oxyCODONE (Oxy IR/ROXICODONE) immediate release tablet  5 mg  5 mg Oral Q4H PRN Dahal, Binaya, MD       polyethylene glycol (MIRALAX / GLYCOLAX) packet 17 g  17 g Oral Daily PRN Dahal, Binaya, MD       simvastatin (ZOCOR) tablet 40 mg  40 mg Oral q1800 Rai, Ripudeep K, MD   40 mg at 06/14/23 1825     Discharge Medications: Please see discharge summary for a list of discharge medications.  Relevant Imaging Results:  Relevant Lab Results:   Additional Information SSN: [id number]  Lorri Frederick, LCSW

## 2023-06-15 NOTE — Plan of Care (Signed)

## 2023-06-15 NOTE — Progress Notes (Signed)
Daily Progress Note   Patient Name: Karen [name]       Date: 06/15/2023 DOB: [birth year]  Age: 82 y.o. MRN#: [id number] Attending Physician: Cathren Harsh, MD Primary Care Physician: Garlan Fillers, MD Admit Date: 06/12/2023  Reason for Consultation/Follow-up: Establishing goals of care  Length of Stay: 2  Current Medications: Scheduled Meds:   feeding supplement  237 mL Oral BID BM   influenza vaccine adjuvanted  0.5 mL Intramuscular Tomorrow-1000   insulin aspart  0-5 Units Subcutaneous QHS   insulin aspart  0-9 Units Subcutaneous TID WC   irbesartan  150 mg Oral Daily   levETIRAcetam  500 mg Oral BID   mirabegron ER  25 mg Oral Daily   simvastatin  40 mg Oral q1800    Continuous Infusions:   PRN Meds: acetaminophen **OR** acetaminophen, albuterol, ALPRAZolam, hydrALAZINE, oxyCODONE, polyethylene glycol  Physical Exam Vitals reviewed.  Constitutional:      General: She is sleeping.     Appearance: She is ill-appearing.     Comments: mitts  Cardiovascular:     Rate and Rhythm: Normal rate.  Pulmonary:     Effort: Pulmonary effort is normal.             Vital Signs: BP (!) 101/59 (BP Location: Right Arm)   Pulse 68   Temp 97.6 F (36.4 C) (Oral)   Resp 16   Ht 4\' 10"  (1.473 m)   Wt 36 kg   SpO2 99%   BMI 16.59 kg/m  SpO2: SpO2: 99 % O2 Device: O2 Device: Room Air O2 Flow Rate:          Palliative Assessment/Data: 10%      Patient Active Problem List   Diagnosis Date Noted   Closed fracture of multiple pubic rami, left, initial encounter (HCC) 06/14/2023   Essential hypertension 06/14/2023   Controlled type 2 diabetes mellitus without complication, without long-term current use of insulin (HCC) 06/14/2023   Fall, initial encounter 06/13/2023    Subdural hematoma (HCC) 06/13/2023   Memory loss 09/17/2018   Obstructive sleep apnea treated with continuous positive airway pressure (CPAP) 09/25/2017   Dementia (HCC) 09/25/2017   Abnormal mammogram of left breast 12/29/2014   Pain in the chest 09/28/2014   Hyperlipidemia 09/28/2014   Ductal carcinoma in situ (DCIS) of right  breast 02/25/2014    Palliative Care Assessment & Plan   Patient Profile: 82 y.o. female  with past medical history of dementia, DM2, HTN, HLD, OSA on CPAP, breast cancer s/p mastectomy, chemo, radiation, anxiety, arthritis. She is a long-term resident at the memory care unit at [facility].  At baseline, patient is demented, able to have some conversation, supposed to walk with a walker but refuses. On 12/3, patient had an unwitnessed fall, unknown if she hit her head and she was brought to the emergency room for evaluation.  She was found to have subdural hemorrhage approximately 6 mm and was transferred to [facility].  She was admitted on 06/12/2023 with acute SDH, possible SAH, acute metabolic encephalopathy with baseline dementia, pelvic rami fracture, and others.     Today's Discussion: Patient sleeping in bed in NAD. No family at bedside. Called son Karen [name]. The patient's family face treatment option decisions, advanced directive decisions, and anticipatory care needs. Karen [name] shares that after a great conversation with the hospice liaison the family has decided the patient will discharge to [facility] with hospice services tomorrow.  Encouraged Karen [name] to call PMT with any questions or concerns. PMT will follow up as needed.  Recommendations/Plan: DNR- Comfort Discharge with hospice to [facility] Palliative medicine to follow up as needed    Code Status:    Code Status Orders  (From admission, onward)           Start     Ordered   06/14/23 1314  Do not attempt resuscitation (DNR) - Comfort care  (Code Status)  Continuous        Question Answer Comment  If patient has no pulse and is not breathing Do Not Attempt Resuscitation   In Pre-Arrest Conditions (Patient Is Breathing and Has a Pulse) Provide comfort measures. Relieve any mechanical airway obstruction. Avoid transfer unless required for comfort.   Consent: Discussion documented in EHR or advanced directives reviewed      06/14/23 1313         Extensive chart review has been completed prior to seeing the patient and calling her son Karen [name] including labs, vital signs, imaging, progress/consult notes, orders, medications, and available advance directive documents.  Time spent: 25 minutes  Thank you for allowing the Palliative Medicine Team to assist in the care of this patient.    Sherryll Burger, NP  Please contact Palliative Medicine Team phone at [phone] for questions and concerns.

## 2023-06-15 NOTE — Progress Notes (Signed)
This chaplain confirmed and updated the Pt. son-Joel; Father from [facility] will visit for Anointing of the Sick after 1pm today.  Chaplain Stephanie Acre [phone]

## 2023-06-15 NOTE — Progress Notes (Signed)
[facility]                                                                              Karen [name], is a 82 y.o. female, DOB - Apr 19, 1941, [id number] Admit date - 06/12/2023    Outpatient Primary MD for the patient is [name], [name], MD  LOS - 2  days  Chief Complaint  Patient presents with   Fall       Brief summary   Patient is a 82 year old female with dementia, diabetes mellitus type 2, HTN, hyperlipidemia, OSA on CPAP, breast CA status postmastectomy, chemo, radiation, anxiety, arthritis, long-term resident at the memory care unit at [facility].  At baseline patient has dementia, able to have some conversation, supposed to walk with a walker but refuses.  On 12/3, patient had an unwitnessed fall unknown if she hit her head.  Patient was brought to [facility] ED and c-collar was placed.  Not on any anticoagulation. Workup revealed acute left SDH/SAH without significant mass effect.   EDP discussed with neurosurgery and was recommended repeat CT head in 8 hours, Keppra for 7 days and BP control. Patient was also seen by surgery/trauma service who recommended patient transfer to [facility] ED. Patient was also noted to have left pubic rami fracture and orthopedics was consulted. Patient was admitted for further workup.  Assessment & Plan    Principal Problem:   Closed fracture of multiple pubic rami, left, initial encounter (HCC) -Seen by orthopedics, recommended WBAT BLE -Follow-up with Dr. Jena Gauss in 3 weeks -Continue pain control, weightbearing as tolerated, follow-up outpatient  Active problems Acute SDH, SAH -Secondary to mechanical fall.  CT head showed acute SDH overlying the left cerebral convexity measures up to 6 mm, likely some subarachnoid hemorrhage in the HSN left temporal and frontal lobes, no midline shift. -EDP discussed the case with neurosurgery on-call Patrici Ranks, did not recommend any surgical intervention and  recommended repeat CT head in 8 hours, Keppra 500 mg twice daily for 7 days, BP monitoring less than 150 -Repeat CT head showed unchanged appearance of small SDH, persistent SAH over the left parietal lobe and in left lateral ventricle -Continue Keppra for 7 days   Acute metabolic encephalopathy superimposed on dementia, anxiety -At baseline has dementia but per family, able to have some conversation. -Currently alert and awake, avoid sedating meds - PTA meds-Depakote 125 mg daily, Namenda 10 mg daily, Aricept 10 mg at bedtime Zoloft 50 mg daily, Remeron 7.5 mg nightly Xanax 0.5 mg twice daily as needed, -Continue Keppra, hold mood altering medications for now given head injury.  For now continue Xanax as needed to avoid any BZD withdrawals   Abnormal imaging findings of the bladder CT pelvis suspected bladder injury but urinalysis unremarkable Trauma team aware.   Hypertension PTA meds- metoprolol 25 mg twice daily, valsartan 160 mg daily -Heart rate in 50s to 60s, holding metoprolol.   -BP stable, continue Avapro 150 mg daily B -Currently on IV hydralazine as needed for hypertension    Type 2 diabetes mellitus, NIDDM -Hemoglobin A1c 5.5 -Outpatient on metformin, currently on hold  CBG (last 3)  Recent Labs    06/14/23 2149 06/15/23 0753 06/15/23 1122  GLUCAP 173* 122* 162*   Continue sliding scale insulin while inpatient   Hyperlipidemia Continue simvastatin when able to   H/o breast cancer s/p mastectomy, chemo, radiation   Underweight Estimated body mass index is 16.59 kg/m as calculated from the following:   Height as of this encounter: 4\' 10"  (1.473 m).   Weight as of this encounter: 36 kg.  Code Status: Full code DVT Prophylaxis:  SCDs Start: 06/13/23 0729   Level of Care: Level of care: Telemetry Medical Family Communication: No family member at the bedside Disposition Plan:      Remains inpatient appropriate: Plan to return back to [facility] tomorrow  with hospice care   Procedures:    Consultants:   Neurosurgery Orthopedics Trauma surgery  Antimicrobials:   Anti-infectives (From admission, onward)    None          Medications  feeding supplement  237 mL Oral BID BM   influenza vaccine adjuvanted  0.5 mL Intramuscular Tomorrow-1000   insulin aspart  0-5 Units Subcutaneous QHS   insulin aspart  0-9 Units Subcutaneous TID WC   irbesartan  150 mg Oral Daily   levETIRAcetam  500 mg Oral BID   mirabegron ER  25 mg Oral Daily   simvastatin  40 mg Oral q1800      Subjective:   Karen [name] was seen and examined today.  Somnolent, not on following any verbal commands, appears comfortable.  No acute issues overnight  Objective:   Vitals:   06/14/23 0727 06/14/23 1348 06/15/23 0519 06/15/23 0750  BP: (!) 149/71 128/83 128/78 (!) 150/65  Pulse: (!) 57 95 78 63  Resp: 16 18  16   Temp: 97.6 F (36.4 C) 98.4 F (36.9 C) 98 F (36.7 C) (!) 97.5 F (36.4 C)  TempSrc:   Oral Oral  SpO2: 97% 100% 95% 100%  Weight:      Height:        Intake/Output Summary (Last 24 hours) at 06/15/2023 1511 Last data filed at 06/14/2023 1826 Gross per 24 hour  Intake 100 ml  Output 100 ml  Net 0 ml     Wt Readings from Last 3 Encounters:  06/13/23 36 kg  04/07/22 49.9 kg  08/11/19 49.9 kg    Physical Exam General: Somnolent, NAD Cardiovascular: S1 S2 clear, RRR.  Respiratory: CTAB Gastrointestinal: Soft, nontender, nondistended, NBS Ext: no pedal edema bilaterally Neuro: not following verbal commands Psych: dementia, somnolent    Data Reviewed:  I have personally reviewed following labs    CBC Lab Results  Component Value Date   WBC 6.5 06/15/2023   RBC 3.96 06/15/2023   HGB 11.6 (L) 06/15/2023   HCT 36.1 06/15/2023   MCV 91.2 06/15/2023   MCH 29.3 06/15/2023   PLT 132 (L) 06/15/2023   MCHC 32.1 06/15/2023   RDW 13.0 06/15/2023   LYMPHSABS 0.9 06/12/2023   MONOABS 0.5 06/12/2023   EOSABS 0.1  06/12/2023   BASOSABS 0.0 06/12/2023     Last metabolic panel Lab Results  Component Value Date   NA 141 06/15/2023   K 3.6 06/15/2023   CL 104 06/15/2023   CO2 29 06/15/2023   BUN 52 (H) 06/15/2023   CREATININE 1.09 (H) 06/15/2023   GLUCOSE 138 (H) 06/15/2023   GFRNONAA 51 (L) 06/15/2023   GFRAA >60 08/11/2019   CALCIUM 9.1 06/15/2023   PROT 7.6 06/12/2023   ALBUMIN 4.2 06/12/2023  BILITOT 0.5 06/12/2023   ALKPHOS 52 06/12/2023   AST 37 06/12/2023   ALT 19 06/12/2023   ANIONGAP 8 06/15/2023    CBG (last 3)  Recent Labs    06/14/23 2149 06/15/23 0753 06/15/23 1122  GLUCAP 173* 122* 162*      Coagulation Profile: No results for input(s): "INR", "PROTIME" in the last 168 hours.   Radiology Studies: I have personally reviewed the imaging studies  No results found.     Thad Ranger M.D. [facility] 06/15/2023, 3:11 PM  Available via Epic secure chat 7am-7pm After 7 pm, please refer to night coverage provider listed on amion.

## 2023-06-15 NOTE — Progress Notes (Signed)
[facility]:  Met with two son's at bedside and DIL. Spoke to them about hospice care at [facility] memory care facility. Introduced hospice care and comfort care approach. They are in agreement with hospice services at [facility]. They have concerns about the facility not allowing them to have any kind of railing use for beds - due to it being considered a restraint. They are requesting that we order a low lying bed to promote safety for the pt.   Our MD has approved the pt for hospice care at the facility.   Norm Parcel RN [phone]

## 2023-06-16 DIAGNOSIS — S065XAA Traumatic subdural hemorrhage with loss of consciousness status unknown, initial encounter: Secondary | ICD-10-CM | POA: Diagnosis not present

## 2023-06-16 DIAGNOSIS — W19XXXA Unspecified fall, initial encounter: Secondary | ICD-10-CM | POA: Diagnosis not present

## 2023-06-16 DIAGNOSIS — S32592A Other specified fracture of left pubis, initial encounter for closed fracture: Secondary | ICD-10-CM | POA: Diagnosis not present

## 2023-06-16 LAB — GLUCOSE, CAPILLARY
Glucose-Capillary: 101 mg/dL — ABNORMAL HIGH (ref 70–99)
Glucose-Capillary: 149 mg/dL — ABNORMAL HIGH (ref 70–99)
Glucose-Capillary: 109 mg/dL — ABNORMAL HIGH (ref 70–99)

## 2023-06-16 LAB — CBC
HCT: 32.7 % — ABNORMAL LOW (ref 36.0–46.0)
Hemoglobin: 10.6 g/dL — ABNORMAL LOW (ref 12.0–15.0)
MCH: 29.5 pg (ref 26.0–34.0)
MCHC: 32.4 g/dL (ref 30.0–36.0)
MCV: 91.1 fL (ref 80.0–100.0)
Platelets: 137 10*3/uL — ABNORMAL LOW (ref 150–400)
RBC: 3.59 MIL/uL — ABNORMAL LOW (ref 3.87–5.11)
RDW: 12.8 % (ref 11.5–15.5)
WBC: 6.3 10*3/uL (ref 4.0–10.5)
nRBC: 0 % (ref 0.0–0.2)

## 2023-06-16 LAB — BASIC METABOLIC PANEL
Anion gap: 8 (ref 5–15)
BUN: 47 mg/dL — ABNORMAL HIGH (ref 8–23)
CO2: 28 mmol/L (ref 22–32)
Calcium: 8.9 mg/dL (ref 8.9–10.3)
Chloride: 103 mmol/L (ref 98–111)
Creatinine, Ser: 1.12 mg/dL — ABNORMAL HIGH (ref 0.44–1.00)
GFR, Estimated: 49 mL/min — ABNORMAL LOW (ref >60)
Glucose, Bld: 142 mg/dL — ABNORMAL HIGH (ref 70–99)
Potassium: 3.5 mmol/L (ref 3.5–5.1)
Sodium: 139 mmol/L (ref 135–145)

## 2023-06-16 MED ORDER — DIVALPROEX SODIUM 125 MG PO CSDR: 125.0000 mg | DELAYED_RELEASE_CAPSULE | Freq: Every evening | ORAL | Status: AC

## 2023-06-16 MED ORDER — POLYETHYLENE GLYCOL 3350 17 G PO PACK: 17.0000 g | PACK | Freq: Every day | ORAL | Status: AC | PRN

## 2023-06-16 MED ORDER — ENSURE ENLIVE PO LIQD: 237.0000 mL | Freq: Two times a day (BID) | ORAL | Status: AC

## 2023-06-16 MED ORDER — LEVETIRACETAM 500 MG PO TABS: 500.0000 mg | ORAL_TABLET | Freq: Two times a day (BID) | ORAL | Status: AC

## 2023-06-16 NOTE — Plan of Care (Signed)
Problem: Fluid Volume: Goal: Ability to maintain a balanced intake and output will improve 06/16/2023 2045 by Olga Millers, LPN Outcome: Progressing 06/16/2023 1824 by Olga Millers, LPN Outcome: Progressing   Problem: Fluid Volume: Goal: Ability to maintain a balanced intake and output will improve 06/16/2023 2045 by Olga Millers, LPN Outcome: Progressing 06/16/2023 1824 by Olga Millers, LPN Outcome: Progressing   Problem: Education: Goal: Ability to describe self-care measures that may prevent or decrease complications (Diabetes Survival Skills Education) will improve 06/16/2023 2045 by Olga Millers, LPN Outcome: Adequate for Discharge 06/16/2023 1824 by Olga Millers, LPN Outcome: Progressing Goal: Individualized Educational Video(s) Outcome: Adequate for Discharge   Problem: Coping: Goal: Ability to adjust to condition or change in health will improve 06/16/2023 2045 by Olga Millers, LPN Outcome: Adequate for Discharge 06/16/2023 1824 by Olga Millers, LPN Outcome: Progressing   Problem: Health Behavior/Discharge Planning: Goal: Ability to identify and utilize available resources and services will improve Outcome: Adequate for Discharge Goal: Ability to manage health-related needs will improve Outcome: Adequate for Discharge   Problem: Metabolic: Goal: Ability to maintain appropriate glucose levels will improve 06/16/2023 2045 by Olga Millers, LPN Outcome: Adequate for Discharge 06/16/2023 1824 by Olga Millers, LPN Outcome: Progressing   Problem: Nutritional: Goal: Maintenance of adequate nutrition will improve 06/16/2023 2045 by Olga Millers, LPN Outcome: Adequate for Discharge 06/16/2023 1824 by Olga Millers, LPN Outcome: Progressing Goal: Progress toward achieving an optimal weight will improve 06/16/2023 2045 by Olga Millers, LPN Outcome: Adequate for Discharge 06/16/2023 1824 by Olga Millers, LPN Outcome: Progressing   Problem: Skin Integrity: Goal:  Risk for impaired skin integrity will decrease 06/16/2023 2045 by Olga Millers, LPN Outcome: Adequate for Discharge 06/16/2023 1824 by Olga Millers, LPN Outcome: Progressing   Problem: Tissue Perfusion: Goal: Adequacy of tissue perfusion will improve Outcome: Adequate for Discharge   Problem: Education: Goal: Knowledge of General Education information will improve Description: Including pain rating scale, medication(s)/side effects and non-pharmacologic comfort measures Outcome: Adequate for Discharge   Problem: Health Behavior/Discharge Planning: Goal: Ability to manage health-related needs will improve Outcome: Adequate for Discharge   Problem: Clinical Measurements: Goal: Ability to maintain clinical measurements within normal limits will improve Outcome: Adequate for Discharge Goal: Will remain free from infection Outcome: Adequate for Discharge Goal: Diagnostic test results will improve Outcome: Adequate for Discharge Goal: Respiratory complications will improve Outcome: Adequate for Discharge Goal: Cardiovascular complication will be avoided Outcome: Adequate for Discharge   Problem: Activity: Goal: Risk for activity intolerance will decrease Outcome: Adequate for Discharge   Problem: Nutrition: Goal: Adequate nutrition will be maintained Outcome: Adequate for Discharge   Problem: Coping: Goal: Level of anxiety will decrease Outcome: Adequate for Discharge   Problem: Elimination: Goal: Will not experience complications related to bowel motility Outcome: Adequate for Discharge Goal: Will not experience complications related to urinary retention Outcome: Adequate for Discharge   Problem: Pain Management: Goal: General experience of comfort will improve Outcome: Adequate for Discharge   Problem: Safety: Goal: Ability to remain free from injury will improve Outcome: Adequate for Discharge   Problem: Skin Integrity: Goal: Risk for impaired skin integrity  will decrease Outcome: Adequate for Discharge   Problem: Education: Goal: Individualized Educational Video(s) Outcome: Adequate for Discharge   Problem: Education: Goal: Ability to describe self-care measures that may prevent or decrease complications (Diabetes Survival Skills Education) will improve 06/16/2023 2045 by Olga Millers, LPN Outcome: Adequate  for Discharge 06/16/2023 1824 by Olga Millers, LPN Outcome: Progressing Goal: Individualized Educational Video(s) Outcome: Adequate for Discharge   Problem: Coping: Goal: Ability to adjust to condition or change in health will improve 06/16/2023 2045 by Olga Millers, LPN Outcome: Adequate for Discharge 06/16/2023 1824 by Olga Millers, LPN Outcome: Progressing   Problem: Health Behavior/Discharge Planning: Goal: Ability to identify and utilize available resources and services will improve Outcome: Adequate for Discharge Goal: Ability to manage health-related needs will improve Outcome: Adequate for Discharge   Problem: Metabolic: Goal: Ability to maintain appropriate glucose levels will improve 06/16/2023 2045 by Olga Millers, LPN Outcome: Adequate for Discharge 06/16/2023 1824 by Olga Millers, LPN Outcome: Progressing   Problem: Nutritional: Goal: Maintenance of adequate nutrition will improve 06/16/2023 2045 by Olga Millers, LPN Outcome: Adequate for Discharge 06/16/2023 1824 by Olga Millers, LPN Outcome: Progressing Goal: Progress toward achieving an optimal weight will improve 06/16/2023 2045 by Olga Millers, LPN Outcome: Adequate for Discharge 06/16/2023 1824 by Olga Millers, LPN Outcome: Progressing   Problem: Skin Integrity: Goal: Risk for impaired skin integrity will decrease 06/16/2023 2045 by Olga Millers, LPN Outcome: Adequate for Discharge 06/16/2023 1824 by Olga Millers, LPN Outcome: Progressing   Problem: Tissue Perfusion: Goal: Adequacy of tissue perfusion will improve Outcome: Adequate for  Discharge   Problem: Education: Goal: Knowledge of General Education information will improve Description: Including pain rating scale, medication(s)/side effects and non-pharmacologic comfort measures Outcome: Adequate for Discharge   Problem: Health Behavior/Discharge Planning: Goal: Ability to manage health-related needs will improve Outcome: Adequate for Discharge   Problem: Clinical Measurements: Goal: Ability to maintain clinical measurements within normal limits will improve Outcome: Adequate for Discharge Goal: Will remain free from infection Outcome: Adequate for Discharge Goal: Diagnostic test results will improve Outcome: Adequate for Discharge Goal: Respiratory complications will improve Outcome: Adequate for Discharge Goal: Cardiovascular complication will be avoided Outcome: Adequate for Discharge   Problem: Activity: Goal: Risk for activity intolerance will decrease Outcome: Adequate for Discharge   Problem: Nutrition: Goal: Adequate nutrition will be maintained Outcome: Adequate for Discharge   Problem: Coping: Goal: Level of anxiety will decrease Outcome: Adequate for Discharge   Problem: Elimination: Goal: Will not experience complications related to bowel motility Outcome: Adequate for Discharge Goal: Will not experience complications related to urinary retention Outcome: Adequate for Discharge   Problem: Pain Management: Goal: General experience of comfort will improve Outcome: Adequate for Discharge   Problem: Safety: Goal: Ability to remain free from injury will improve Outcome: Adequate for Discharge   Problem: Skin Integrity: Goal: Risk for impaired skin integrity will decrease Outcome: Adequate for Discharge   Problem: Fluid Volume: Goal: Ability to maintain a balanced intake and output will improve 06/16/2023 2045 by Olga Millers, LPN Outcome: Progressing 06/16/2023 1824 by Olga Millers, LPN Outcome: Progressing

## 2023-06-16 NOTE — Progress Notes (Signed)
This nurse called to given report to [employer] stated there is no Nurse in the building to receive report. Report was given to [employer]. Pt is now awaiting PTAR for transportation.

## 2023-06-16 NOTE — TOC Transition Note (Signed)
Transition of Care [facility]) - CM/SW Discharge Note   Patient Details  Name: Karen [name] MRN: [id number] Date of Birth: May 13, 1941  [facility] CM/SW Contact:  Jimmy Picket, LCSW Phone Number: 06/16/2023, 1:29 PM   Clinical Narrative:     Per MD patient ready for DC to [facility]. RN, patient, patient's family, and facility notified of DC. Discharge Summary and FL2 sent to facility by fax at [phone]. DC packet on chart.   Ambulance transport requested for patient.    RN to call report to [phone].   CSW will sign off for now as social work intervention is no longer needed. Please consult [place] again if new needs arise.   Final next level of care: Skilled Nursing Facility Barriers to Discharge: Barriers Resolved   Patient Goals and CMS Choice      Discharge Placement                Patient chooses bed at: Other - please specify in the comment section below: ([facility]) Patient to be transferred to facility by: [facility] Name of family member notified: son, Francis Dowse Patient and family notified of of transfer: 06/16/23  Discharge Plan and Services Additional resources added to the After Visit Summary for                                       Social Determinants of Health (SDOH) Interventions SDOH Screenings   Food Insecurity: Patient Unable To Answer (06/13/2023)  Housing: Patient Unable To Answer (06/13/2023)  Transportation Needs: Patient Unable To Answer (06/13/2023)  Utilities: Patient Unable To Answer (06/13/2023)  Tobacco Use: Medium Risk (06/12/2023)     Readmission Risk Interventions     No data to display         Jimmy Picket, LCSW Clinical Social Worker

## 2023-06-16 NOTE — Progress Notes (Signed)
[facility]                                                                              Karen [name], is a 82 y.o. female, DOB - [birth year], ION:[id number] Admit date - 06/12/2023    Outpatient Primary MD for the patient is [name], [name], MD  LOS - 3  days  Chief Complaint  Patient presents with   Fall       Brief summary   Patient is a 82 year old female with dementia, diabetes mellitus type 2, HTN, hyperlipidemia, OSA on CPAP, breast CA status postmastectomy, chemo, radiation, anxiety, arthritis, long-term resident at the memory care unit at [facility].  At baseline patient has dementia, able to have some conversation, supposed to walk with a walker but refuses.  On 12/3, patient had an unwitnessed fall unknown if she hit her head.  Patient was brought to [facility] ED and c-collar was placed.  Not on any anticoagulation. Workup revealed acute left SDH/SAH without significant mass effect.   EDP discussed with neurosurgery and was recommended repeat CT head in 8 hours, Keppra for 7 days and BP control. Patient was also seen by surgery/trauma service who recommended patient transfer to [facility] ED. Patient was also noted to have left pubic rami fracture and orthopedics was consulted. Patient was admitted for further workup.  Assessment & Plan    Principal Problem:   Closed fracture of multiple pubic rami, left, initial encounter (HCC) -Seen by orthopedics, recommended WBAT BLE -Follow-up with Dr. Jena Gauss in 3 weeks -Continue pain control, weightbearing as tolerated, follow-up outpatient  Active problems Acute SDH, SAH -Secondary to mechanical fall.  CT head showed acute SDH overlying the left cerebral convexity measures up to 6 mm, likely some subarachnoid hemorrhage in the HSN left temporal and frontal lobes, no midline shift. -EDP discussed the case with neurosurgery on-call Patrici Ranks, did not recommend any surgical intervention and  recommended repeat CT head in 8 hours, Keppra 500 mg twice daily for 7 days, BP monitoring less than 150 -Repeat CT head showed unchanged appearance of small SDH, persistent SAH over the left parietal lobe and in left lateral ventricle -Continue Keppra for 7 days   Acute metabolic encephalopathy superimposed on dementia, anxiety -At baseline has dementia but per family, able to have some conversation. -Currently alert and awake, avoid sedating meds - PTA meds-Depakote 125 mg daily, Namenda 10 mg daily, Aricept 10 mg at bedtime, Zoloft 50 mg daily, Remeron 7.5 mg nightly Xanax 0.5 mg twice daily as needed, -Continue Keppra, hold mood altering medications for now given head injury.   - continue Xanax as needed to avoid any BZD withdrawals   Abnormal imaging findings of the bladder CT pelvis suspected bladder injury but urinalysis unremarkable Trauma team aware.   Hypertension PTA meds- metoprolol 25 mg twice daily, valsartan 160 mg daily -Heart rate in 50s to 60s, holding metoprolol.   -BP stable, continue Avapro 150 mg daily B -Currently on IV hydralazine as needed for hypertension    Type 2 diabetes mellitus, NIDDM -Hemoglobin A1c 5.5 -Outpatient on metformin, currently on hold  CBG (last 3)  Recent Labs    06/15/23 2047 06/16/23 0755 06/16/23 1147  GLUCAP 118* 101* 149*   Continue sliding scale insulin while inpatient   Hyperlipidemia Continue simvastatin when able to   H/o breast cancer s/p mastectomy, chemo, radiation   Underweight Estimated body mass index is 16.59 kg/m as calculated from the following:   Height as of this encounter: 4\' 10"  (1.473 m).   Weight as of this encounter: 36 kg.  Code Status: Full code DVT Prophylaxis:  SCDs Start: 06/13/23 0729   Level of Care: Level of care: Telemetry Medical Family Communication: No family member at the bedside Disposition Plan:      Remains inpatient appropriate: Plan to return back to [place] facility with  hospice, TOC to check when patient can return    Procedures:    Consultants:   Neurosurgery Orthopedics Trauma surgery  Antimicrobials:   Anti-infectives (From admission, onward)    None          Medications  feeding supplement  237 mL Oral BID BM   influenza vaccine adjuvanted  0.5 mL Intramuscular Tomorrow-1000   insulin aspart  0-5 Units Subcutaneous QHS   insulin aspart  0-9 Units Subcutaneous TID WC   irbesartan  150 mg Oral Daily   levETIRAcetam  500 mg Oral BID   mirabegron ER  25 mg Oral Daily   simvastatin  40 mg Oral q1800      Subjective:   Karen [name] was seen and examined today.  No acute complaints, one-word responses.  Appears to be comfortable.  Objective:   Vitals:   06/15/23 0519 06/15/23 0750 06/15/23 1513 06/16/23 0747  BP: 128/78 (!) 150/65 (!) 101/59 (!) 149/75  Pulse: 78 63 68 63  Resp:  16 16   Temp: 98 F (36.7 C) (!) 97.5 F (36.4 C) 97.6 F (36.4 C)   TempSrc: Oral Oral Oral   SpO2: 95% 100% 99% 100%  Weight:      Height:        Intake/Output Summary (Last 24 hours) at 06/16/2023 1321 Last data filed at 06/16/2023 1111 Gross per 24 hour  Intake 258 ml  Output 150 ml  Net 108 ml     Wt Readings from Last 3 Encounters:  06/13/23 36 kg  04/07/22 49.9 kg  08/11/19 49.9 kg   Physical Exam General: Alert and awake, NAD, appears comfortable Cardiovascular: S1 S2 clear, RRR.  Respiratory: CTAB, no wheezing, rales or rhonchi Gastrointestinal: Soft, nontender, nondistended, NBS Ext: no pedal edema bilaterally Neuro: does not follow verbal commands Psych: dementia    Data Reviewed:  I have personally reviewed following labs    CBC Lab Results  Component Value Date   WBC 6.3 06/16/2023   RBC 3.59 (L) 06/16/2023   HGB 10.6 (L) 06/16/2023   HCT 32.7 (L) 06/16/2023   MCV 91.1 06/16/2023   MCH 29.5 06/16/2023   PLT 137 (L) 06/16/2023   MCHC 32.4 06/16/2023   RDW 12.8 06/16/2023   LYMPHSABS 0.9 06/12/2023    MONOABS 0.5 06/12/2023   EOSABS 0.1 06/12/2023   BASOSABS 0.0 06/12/2023     Last metabolic panel Lab Results  Component Value Date   NA 139 06/16/2023   K 3.5 06/16/2023   CL 103 06/16/2023   CO2 28 06/16/2023   BUN 47 (H) 06/16/2023   CREATININE 1.12 (H) 06/16/2023   GLUCOSE 142 (H) 06/16/2023   GFRNONAA 49 (L) 06/16/2023   GFRAA >60 08/11/2019   CALCIUM 8.9 06/16/2023  PROT 7.6 06/12/2023   ALBUMIN 4.2 06/12/2023   BILITOT 0.5 06/12/2023   ALKPHOS 52 06/12/2023   AST 37 06/12/2023   ALT 19 06/12/2023   ANIONGAP 8 06/16/2023    CBG (last 3)  Recent Labs    06/15/23 2047 06/16/23 0755 06/16/23 1147  GLUCAP 118* 101* 149*      Coagulation Profile: No results for input(s): "INR", "PROTIME" in the last 168 hours.   Radiology Studies: I have personally reviewed the imaging studies  No results found.     Thad Ranger M.D. [facility] 06/16/2023, 1:21 PM  Available via Epic secure chat 7am-7pm After 7 pm, please refer to night coverage provider listed on amion.

## 2023-06-16 NOTE — Plan of Care (Signed)
  Problem: Education: Goal: Ability to describe self-care measures that may prevent or decrease complications (Diabetes Survival Skills Education) will improve Outcome: Progressing   Problem: Coping: Goal: Ability to adjust to condition or change in health will improve Outcome: Progressing   Problem: Fluid Volume: Goal: Ability to maintain a balanced intake and output will improve Outcome: Progressing   Problem: Metabolic: Goal: Ability to maintain appropriate glucose levels will improve Outcome: Progressing   Problem: Nutritional: Goal: Maintenance of adequate nutrition will improve Outcome: Progressing Goal: Progress toward achieving an optimal weight will improve Outcome: Progressing   Problem: Skin Integrity: Goal: Risk for impaired skin integrity will decrease Outcome: Progressing

## 2023-06-16 NOTE — Discharge Summary (Signed)
Physician Discharge Summary   Patient: Karen [name] MRN: [id number] DOB: 03/27/1941  Admit date:     06/12/2023  Discharge date: 06/16/23  Discharge Physician: Thad Ranger, MD    PCP: Garlan Fillers, MD   Recommendations at discharge:   Continue Keppra 500 mg p.o. twice daily for 5 more days, then stop Hold Depakote and other sedating meds while on Keppra.  Fall precautions. Outpatient follow-up with Dr. Jena Gauss, orthopedics in 3 weeks Weightbearing as tolerated bilateral lower extremity Fall precautions Diet: Dysphagia 2 diet with thin liquids  Discharge Diagnoses:    Closed fracture of multiple pubic rami, left, initial encounter (HCC)   SDH (subdural hematoma) (HCC) Mechanical fall   Ductal carcinoma in situ (DCIS) of right breast   Hyperlipidemia   Obstructive sleep apnea treated with continuous positive airway pressure (CPAP)   Essential hypertension   Controlled type 2 diabetes mellitus without complication, without long-term current use of insulin [facility])   Hospital Course:  Patient is a 82 year old female with dementia, diabetes mellitus type 2, HTN, hyperlipidemia, OSA on CPAP, breast CA status postmastectomy, chemo, radiation, anxiety, arthritis, long-term resident at the memory care unit at [facility].  At baseline patient has dementia, able to have some conversation, supposed to walk with a walker but refuses.  On 12/3, patient had an unwitnessed fall unknown if she hit her head.  Patient was brought to [facility] ED and c-collar was placed.  Not on any anticoagulation. Workup revealed acute left SDH/SAH without significant mass effect.   EDP discussed with neurosurgery and was recommended repeat CT head in 8 hours, Keppra for 7 days and BP control. Patient was also seen by surgery/trauma service who recommended patient transfer to [facility] ED. Patient was also noted to have left pubic rami fracture and orthopedics was consulted. Patient was admitted for  further workup.  Assessment and Plan:  Closed fracture of multiple pubic rami, left, initial encounter (HCC) -Seen by orthopedics, recommended WBAT BLE -Follow-up with Dr. Jena Gauss in 3 weeks -Continue pain control, weightbearing as tolerated, follow-up outpatient    Acute SDH, SAH -Secondary to mechanical fall.  CT head showed acute SDH overlying the left cerebral convexity measures up to 6 mm, likely some subarachnoid hemorrhage in the HSN left temporal and frontal lobes, no midline shift. -EDP discussed the case with neurosurgery on-call Patrici Ranks, did not recommend any surgical intervention and recommended repeat CT head in 8 hours, Keppra 500 mg twice daily for 7 days, BP monitoring less than 150 -Repeat CT head showed unchanged appearance of small SDH, persistent SAH over the left parietal lobe and in left lateral ventricle -Continue Keppra for total 7 days   Acute metabolic encephalopathy superimposed on dementia, anxiety -At baseline has dementia but per family, able to have some conversation. -Currently alert and awake, so avoid sedating meds - PTA meds-Depakote 125 mg daily, Namenda 10 mg daily, Aricept 10 mg at bedtime, Zoloft 50 mg daily, Remeron 7.5 mg nightly Xanax 0.5 mg twice daily as needed, -Continue Keppra, hold mood altering medications for now given head injury.   - continue Xanax as needed to avoid any BZD withdrawals     Abnormal imaging findings of the bladder CT pelvis suspected bladder injury but urinalysis unremarkable Trauma team aware.   Hypertension PTA meds- metoprolol 25 mg twice daily, valsartan 160 mg daily -Heart rate in 50s to 60s, holding metoprolol.   -BP stable, continue valsartan     Type 2 diabetes mellitus, NIDDM -  Hemoglobin A1c 5.5 -Continue metformin     Hyperlipidemia Continue simvastatin    H/o breast cancer s/p mastectomy, chemo, radiation   Underweight Estimated body mass index is 16.59 kg/m as calculated from the  following:   Height as of this encounter: 4\' 10"  (1.473 m).   Weight as of this encounter: 36 kg.     Pain control - [employer] Controlled Substance Reporting System database was reviewed. and patient was instructed, not to drive, operate heavy machinery, perform activities at heights, swimming or participation in water activities or provide baby-sitting services while on Pain, Sleep and Anxiety Medications; until their outpatient Physician has advised to do so again. Also recommended to not to take more than prescribed Pain, Sleep and Anxiety Medications.  Consultants: Neurosurgery, orthopedics Procedures performed: None Disposition: Skilled nursing facility Diet recommendation: Dysphagia 2 diet with thin liquids  DISCHARGE MEDICATION: Allergies as of 06/16/2023   No Known Allergies      Medication List     STOP taking these medications    donepezil 10 MG tablet Commonly known as: ARICEPT   memantine 10 MG tablet Commonly known as: NAMENDA   metoprolol tartrate 25 MG tablet Commonly known as: LOPRESSOR   mirtazapine 7.5 MG tablet Commonly known as: REMERON   montelukast 10 MG tablet Commonly known as: SINGULAIR   sertraline 50 MG tablet Commonly known as: ZOLOFT       TAKE these medications    acetaminophen 500 MG tablet Commonly known as: TYLENOL Take 500 mg by mouth every 8 (eight) hours as needed for mild pain (pain score 1-3) or fever.   ALPRAZolam 0.5 MG tablet Commonly known as: XANAX Take 1 tablet (0.5 mg total) by mouth 2 (two) times daily as needed for anxiety.   cetirizine 10 MG tablet Commonly known as: ZYRTEC Take 10 mg by mouth daily.   Cholecalciferol 50 MCG (2000 UT) Tabs Take 1 tablet by mouth daily.   divalproex 125 MG capsule Commonly known as: DEPAKOTE SPRINKLE Take 1 capsule (125 mg total) by mouth at bedtime. HOLD while taking Keppra What changed: additional instructions   feeding supplement Liqd Take 237 mLs by mouth 2 (two)  times daily between meals.   levETIRAcetam 500 MG tablet Commonly known as: KEPPRA Take 1 tablet (500 mg total) by mouth 2 (two) times daily for 5 days.   metFORMIN 500 MG 24 hr tablet Commonly known as: GLUCOPHAGE-XR Take 500 mg by mouth every evening.   Myrbetriq 8 MG/ML Srer Generic drug: Mirabegron ER Take 3 mLs by mouth daily.   polyethylene glycol 17 g packet Commonly known as: MIRALAX / GLYCOLAX Take 17 g by mouth daily as needed for mild constipation.   simvastatin 40 MG tablet Commonly known as: ZOCOR SMARTSIG:1.0 Tablet(s) By Mouth Every Night   tacrolimus 0.03 % ointment Commonly known as: PROTOPIC Apply 1 Application topically 2 (two) times daily. Apply to affected areas on chest and arms   valsartan 160 MG tablet Commonly known as: DIOVAN SMARTSIG:1.0 Tablet(s) By Mouth Daily        Follow-up Information     Garlan Fillers, MD. Schedule an appointment as soon as possible for a visit in 2 week(s).   Specialty: Internal Medicine Why: for hospital follow-up Contact information: [address] [place] [address] [phone]         Roby Lofts, MD. Schedule an appointment as soon as possible for a visit in 3 week(s).   Specialty: Orthopedic Surgery Why: for hospital  follow-up Contact information: [address] [place] [address] [phone]                Discharge Exam: Ceasar Mons Weights   06/13/23 1730  Weight: 36 kg   S: Alert and awake, one-word resp status.  No acute complaints, appears comfortable.  BP 133/85 (BP Location: Right Arm)   Pulse 63   Temp (!) 97.5 F (36.4 C)   Resp 16   Ht 4\' 10"  (1.473 m)   Wt 36 kg   SpO2 98%   BMI 16.59 kg/m   Physical Exam General: Alert and awake, NAD Cardiovascular: S1 S2 clear, RRR.  Respiratory: CTAB Gastrointestinal: Soft, nontender, nondistended, NBS Ext: no pedal edema bilaterally Psych: has dementia   Condition at discharge: stable  The results of  significant diagnostics from this hospitalization (including imaging, microbiology, ancillary and laboratory) are listed below for reference.   Imaging Studies: CT HEAD WO CONTRAST ( )  Result Date: 06/13/2023 CLINICAL DATA:  Subdural hematoma follow-up EXAM: CT HEAD WITHOUT CONTRAST TECHNIQUE: Contiguous axial images were obtained from the base of the skull through the vertex without intravenous contrast. RADIATION DOSE REDUCTION: This exam was performed according to the departmental dose-optimization program which includes automated exposure control, adjustment of the mA and/or kV according to patient size and/or use of iterative reconstruction technique. COMPARISON:  06/12/2023 FINDINGS: Brain: Unchanged appearance of small left convexity subdural hematoma allowing for redistribution. Persistent subarachnoid blood over the left parietal lobe and in the left lateral ventricle. No new site of hemorrhage. Size and configuration ventricles are unchanged. Vascular: There is atherosclerotic calcification of both internal carotid arteries at the skull base. Skull: Left parietal scalp hematoma Sinuses/Orbits: Paranasal sinuses are clear. No mastoid effusion. Normal orbits. Other: None IMPRESSION: 1. Unchanged appearance of small left convexity subdural hematoma allowing for redistribution. 2. Persistent subarachnoid blood over the left parietal lobe and in the left lateral ventricle. 3. No new site of hemorrhage. Electronically Signed   By: Deatra Robinson M.D.   On: 06/13/2023 02:22   CT CHEST ABDOMEN PELVIS W CONTRAST  Result Date: 06/12/2023 CLINICAL DATA:  Polytrauma, blunt or unwitnessed fall, unknown if hit head, no thinners. Old bruising noted to forehead, C-Collar in place. EXAM: CT CHEST, ABDOMEN, AND PELVIS WITH CONTRAST TECHNIQUE: Multidetector CT imaging of the chest, abdomen and pelvis was performed following the standard protocol during bolus administration of intravenous contrast. RADIATION DOSE  REDUCTION: This exam was performed according to the departmental dose-optimization program which includes automated exposure control, adjustment of the mA and/or kV according to patient size and/or use of iterative reconstruction technique. CONTRAST:  OMNIPAQUE IOHEXOL 300 MG/ML  SOLN COMPARISON:  CT abdomen pelvis 01/11/2005, x-ray pelvis 05/16/2023, x-ray pelvis 06/12/2023 FINDINGS: CHEST: Cardiovascular: No aortic injury. The thoracic aorta is normal in caliber. The heart is normal in size. No significant pericardial effusion. The main pulmonary artery is normal in caliber. No central or proximal segmental pulmonary embolus. No a calcification. Aortic valve leaflet calcifications. At least 3 vessel coronary calcification. Severe atherosclerotic plaque of the aorta. Mediastinum/Nodes: No pneumomediastinum. No mediastinal hematoma. The esophagus is unremarkable. The thyroid is unremarkable. The central airways are patent. No mediastinal, hilar, or axillary lymphadenopathy. Lungs/Pleura: No focal consolidation. No pulmonary nodule. No pulmonary mass. No pulmonary contusion or laceration. No pneumatocele formation. No pleural effusion. No pneumothorax. No hemothorax. Musculoskeletal/Chest wall: No chest wall mass. Old healed right rib fractures. No acute rib or sternal fracture. No spinal fracture. No acute fracture  of the partially visualized upper extremities with motion artifact noted on this CT and correlated on the scout. ABDOMEN / PELVIS: Hepatobiliary: Not enlarged. No focal lesion. No laceration or subcapsular hematoma. The gallbladder is otherwise unremarkable with no radio-opaque gallstones. No biliary ductal dilatation. Pancreas: Normal pancreatic contour. No main pancreatic duct dilatation. Spleen: Not enlarged. No focal lesion. No laceration, subcapsular hematoma, or vascular injury. Adrenals/Urinary Tract: No nodularity bilaterally. Bilateral kidneys enhance symmetrically. No hydronephrosis. No  contusion, laceration, or subcapsular hematoma. No injury to the vascular structures or collecting systems. No hydroureter. The urinary bladder is unremarkable. Stomach/Bowel: No small or large bowel wall thickening or dilatation. The appendix is unremarkable. Vasculature/Lymphatics: Severe atherosclerotic plaque no abdominal aorta or iliac aneurysm. No active contrast extravasation or pseudoaneurysm. No abdominal, pelvic, inguinal lymphadenopathy. Reproductive: Uterus and bilateral adnexal regions are unremarkable. Other: Trace high density free fluid along the left space of Retzius. No simple free fluid ascites. No pneumoperitoneum. No hemoperitoneum. No mesenteric hematoma identified. No organized fluid collection. Musculoskeletal: No significant soft tissue hematoma. Bilateral sacroiliac joint sclerosis. Acute displaced left superior pubic rami fracture. Acute nondisplaced left inferior pubic rami fracture. No spinal fracture. Grade 1 anterolisthesis of L4 on L5. Ports and Devices: None. IMPRESSION: 1. Acute displaced left superior pubic rami fracture. Acute nondisplaced left inferior pubic rami fracture. 2. Circumferential urinary bladder wall thickening as well as trace blood product along the left space of Retzius. Extraperitoneal as well as intraperitoneal urinary bladder injury not excluded. Correlate with urinalysis and consider cystogram for further evaluation if clinically indicated. 3. No acute intrathoracic or intra-abdominal traumatic injury. 4. No acute fracture or traumatic malalignment of the thoracic or lumbar spine. 5. Aortic Atherosclerosis (ICD10-I70.0) including mitral annular, coronary artery, and aortic valve leaflet calcifications-correlate for aortic stenosis. Electronically Signed   By: Tish Frederickson M.D.   On: 06/12/2023 22:39   DG Hip Unilat With Pelvis 2-3 Views Left  Result Date: 06/12/2023 CLINICAL DATA:  Status post fall. Rotated appearance of the left proximal femur on pelvis  radiographs EXAM: DG HIP (WITH OR WITHOUT PELVIS) 3V LEFT COMPARISON:  Same-day pelvis radiograph FINDINGS: Again seen is displaced fracture of the parasymphyseal left pubic ramus. Questionable nondisplaced fracture through the left inferior pubic ramus. No acute fracture or dislocation of the left proximal femur. There is no evidence of arthropathy or other focal bone abnormality. IMPRESSION: 1. Displaced fracture of the parasymphyseal left pubic ramus. Questionable nondisplaced fracture through the left inferior pubic ramus. 2. No acute fracture or dislocation of the left proximal femur. Electronically Signed   By: Agustin Cree M.D.   On: 06/12/2023 20:39   DG Pelvis Portable  Result Date: 06/12/2023 CLINICAL DATA:  Unwitnessed fall. EXAM: PORTABLE PELVIS 1-2 VIEWS COMPARISON:  May 16, 2023. FINDINGS: Minimally displaced fracture is seen involving the left superior pubic ramus. Severe degenerative changes are seen involving the sacroiliac joints. Left proximal femur somewhat rotated. IMPRESSION: Minimally displaced fracture involving the left superior pubic ramus. Left proximal femur is somewhat rotated; radiographs of left hip are recommended for further evaluation. Electronically Signed   By: Lupita Raider M.D.   On: 06/12/2023 18:56   DG Chest Port 1 View  Result Date: 06/12/2023 CLINICAL DATA:  Unwitnessed fall. EXAM: PORTABLE CHEST 1 VIEW COMPARISON:  May 16, 2023. FINDINGS: The heart size and mediastinal contours are within normal limits. Both lungs are clear. Old right rib fractures are noted. IMPRESSION: No active disease. Electronically Signed   By: Lupita Raider  M.D.   On: 06/12/2023 18:53   CT Head Wo Contrast  Result Date: 06/12/2023 CLINICAL DATA:  Unwitnessed fall, head and neck trauma EXAM: CT HEAD WITHOUT CONTRAST CT CERVICAL SPINE WITHOUT CONTRAST TECHNIQUE: Multidetector CT imaging of the head and cervical spine was performed following the standard protocol without  intravenous contrast. Multiplanar CT image reconstructions of the cervical spine were also generated. RADIATION DOSE REDUCTION: This exam was performed according to the departmental dose-optimization program which includes automated exposure control, adjustment of the mA and/or kV according to patient size and/or use of iterative reconstruction technique. COMPARISON:  05/16/2023 FINDINGS: CT HEAD FINDINGS Brain: Acute subdural hemorrhage overlying the left cerebral convexity, which measures up to 6 mm (series 6, image 38). There is likely some subarachnoid hemorrhage in the adjacent left temporal and frontal lobes (series 3, image 14). No evidence of acute infarct, parenchymal hemorrhage, mass, mass effect, or midline shift. No hydrocephalus. Age related cerebral volume loss. Vascular: No hyperdense vessel. Skull: Evaluation is somewhat limited by motion. Within this limitation, negative for fracture or focal lesion. Left parietal scalp hematoma. Sinuses/Orbits: No acute finding. CT CERVICAL SPINE FINDINGS Alignment: No traumatic listhesis. Straightening and mild reversal of the normal cervical lordosis, with trace retrolisthesis of C5 on C6. Skull base and vertebrae: No acute fracture. No primary bone lesion or focal pathologic process. Soft tissues and spinal canal: No prevertebral fluid or swelling. No visible canal hematoma. Disc levels: Degenerative changes in the cervical spine. Moderate spinal canal stenosis at C5-C6. Upper chest: Negative. IMPRESSION: 1. Acute subdural hemorrhage overlying the left cerebral convexity, which measures up to 6 mm. There is likely some subarachnoid hemorrhage in the adjacent left temporal and frontal lobes. No midline shift. 2. Left parietal scalp hematoma. 3. No acute fracture or traumatic listhesis in the cervical spine. These results were called by telephone at the time of interpretation on 06/12/2023 at 6:22 pm to provider [facility] , who verbally acknowledged these  results. Electronically Signed   By: Wiliam Ke M.D.   On: 06/12/2023 18:22   CT Cervical Spine Wo Contrast  Result Date: 06/12/2023 CLINICAL DATA:  Unwitnessed fall, head and neck trauma EXAM: CT HEAD WITHOUT CONTRAST CT CERVICAL SPINE WITHOUT CONTRAST TECHNIQUE: Multidetector CT imaging of the head and cervical spine was performed following the standard protocol without intravenous contrast. Multiplanar CT image reconstructions of the cervical spine were also generated. RADIATION DOSE REDUCTION: This exam was performed according to the departmental dose-optimization program which includes automated exposure control, adjustment of the mA and/or kV according to patient size and/or use of iterative reconstruction technique. COMPARISON:  05/16/2023 FINDINGS: CT HEAD FINDINGS Brain: Acute subdural hemorrhage overlying the left cerebral convexity, which measures up to 6 mm (series 6, image 38). There is likely some subarachnoid hemorrhage in the adjacent left temporal and frontal lobes (series 3, image 14). No evidence of acute infarct, parenchymal hemorrhage, mass, mass effect, or midline shift. No hydrocephalus. Age related cerebral volume loss. Vascular: No hyperdense vessel. Skull: Evaluation is somewhat limited by motion. Within this limitation, negative for fracture or focal lesion. Left parietal scalp hematoma. Sinuses/Orbits: No acute finding. CT CERVICAL SPINE FINDINGS Alignment: No traumatic listhesis. Straightening and mild reversal of the normal cervical lordosis, with trace retrolisthesis of C5 on C6. Skull base and vertebrae: No acute fracture. No primary bone lesion or focal pathologic process. Soft tissues and spinal canal: No prevertebral fluid or swelling. No visible canal hematoma. Disc levels: Degenerative changes in the cervical spine. Moderate  spinal canal stenosis at C5-C6. Upper chest: Negative. IMPRESSION: 1. Acute subdural hemorrhage overlying the left cerebral convexity, which measures  up to 6 mm. There is likely some subarachnoid hemorrhage in the adjacent left temporal and frontal lobes. No midline shift. 2. Left parietal scalp hematoma. 3. No acute fracture or traumatic listhesis in the cervical spine. These results were called by telephone at the time of interpretation on 06/12/2023 at 6:22 pm to provider [facility] , who verbally acknowledged these results. Electronically Signed   By: Wiliam Ke M.D.   On: 06/12/2023 18:22    Microbiology: Results for orders placed or performed during the hospital encounter of 10/30/09  Urine culture     Status: None   Collection Time: 10/31/09 12:54 AM   Specimen: Urine, Random  Result Value Ref Range Status   Specimen Description URINE, RANDOM  Final   Special Requests NONE  Final   Colony Count NO GROWTH  Final   Culture NO GROWTH  Final   Report Status 11/01/2009 FINAL  Final    Labs: CBC: Recent Labs  Lab 06/12/23 1838 06/14/23 0513 06/15/23 0552 06/16/23 0404  WBC 5.8 4.7 6.5 6.3  NEUTROABS 4.3  --   --   --   HGB 11.7* 11.3* 11.6* 10.6*  HCT 36.5 36.2 36.1 32.7*  MCV 94.3 91.2 91.2 91.1  PLT 132* 122* 132* 137*   Basic Metabolic Panel: Recent Labs  Lab 06/12/23 1838 06/14/23 0513 06/15/23 0552 06/16/23 0404  NA 141 144 141 139  K 4.0 3.5 3.6 3.5  CL 106 105 104 103  CO2 28 28 29 28   GLUCOSE 111* 105* 138* 142*  BUN 57* 44* 52* 47*  CREATININE 1.07* 0.97 1.09* 1.12*  CALCIUM 9.1 9.1 9.1 8.9   Liver Function Tests: Recent Labs  Lab 06/12/23 1838  AST 37  ALT 19  ALKPHOS 52  BILITOT 0.5  PROT 7.6  ALBUMIN 4.2   CBG: Recent Labs  Lab 06/15/23 1705 06/15/23 1726 06/15/23 2047 06/16/23 0755 06/16/23 1147  GLUCAP 127* 121* 118* 101* 149*    Discharge time spent: greater than 30 minutes.  Signed: Thad Ranger, MD [facility] 06/16/2023

## 2024-08-10 DEATH — deceased
